# Patient Record
Sex: Male | Born: 1980 | Race: Black or African American | Hispanic: No | Marital: Married | Smoking: Former smoker
Health system: Southern US, Community
[De-identification: ages and names within clinical notes are randomized; demographics above are authoritative.]

## PROBLEM LIST (undated history)

## (undated) DIAGNOSIS — I671 Cerebral aneurysm, nonruptured: Secondary | ICD-10-CM

## (undated) HISTORY — PX: VENTRICULOPERITONEAL SHUNT: SHX204

## (undated) SURGERY — Surgical Case
Anesthesia: *Unknown

---

## 2009-07-23 ENCOUNTER — Emergency Department (HOSPITAL_COMMUNITY): Admission: EM | Admit: 2009-07-23 | Discharge: 2009-07-23 | Payer: Self-pay | Admitting: Emergency Medicine

## 2012-05-05 ENCOUNTER — Emergency Department (HOSPITAL_COMMUNITY)
Admission: EM | Admit: 2012-05-05 | Discharge: 2012-05-05 | Disposition: A | Discharge status: Home / Self Care | Payer: Self-pay | Attending: Emergency Medicine | Admitting: Emergency Medicine

## 2012-05-05 ENCOUNTER — Emergency Department (HOSPITAL_COMMUNITY): Payer: Self-pay

## 2012-05-05 ENCOUNTER — Encounter (HOSPITAL_COMMUNITY): Payer: Self-pay | Admitting: Emergency Medicine

## 2012-05-05 DIAGNOSIS — M62838 Other muscle spasm: Secondary | ICD-10-CM | POA: Insufficient documentation

## 2012-05-05 DIAGNOSIS — F17200 Nicotine dependence, unspecified, uncomplicated: Secondary | ICD-10-CM | POA: Insufficient documentation

## 2012-05-05 DIAGNOSIS — M549 Dorsalgia, unspecified: Secondary | ICD-10-CM | POA: Insufficient documentation

## 2012-05-05 HISTORY — DX: Cerebral aneurysm, nonruptured: I67.1

## 2012-05-05 MED ORDER — OXYCODONE-ACETAMINOPHEN 5-325 MG PO TABS: 1.0000 | ORAL_TABLET | Freq: Four times a day (QID) | ORAL | Status: AC | PRN

## 2012-05-05 MED ORDER — DIAZEPAM 5 MG/ML IJ SOLN: 5.0000 mg | Freq: Once | INTRAMUSCULAR | Status: DC

## 2012-05-05 MED ORDER — OXYCODONE-ACETAMINOPHEN 5-325 MG PO TABS
2.0000 | ORAL_TABLET | Freq: Once | ORAL | Status: AC
  Administered 2012-05-05: 2 via ORAL
  Filled 2012-05-05: qty 2

## 2012-05-05 MED ORDER — DIAZEPAM 5 MG PO TABS: 5.0000 mg | ORAL_TABLET | Freq: Four times a day (QID) | ORAL | Status: AC | PRN

## 2012-05-05 MED ORDER — DIAZEPAM 5 MG PO TABS
5.0000 mg | ORAL_TABLET | Freq: Once | ORAL | Status: AC
  Administered 2012-05-05: 5 mg via ORAL
  Filled 2012-05-05: qty 1

## 2012-05-05 MED ORDER — HYDROMORPHONE HCL PF 1 MG/ML IJ SOLN: 1.0000 mg | Freq: Once | INTRAMUSCULAR | Status: DC

## 2012-05-05 MED ORDER — IBUPROFEN 800 MG PO TABS: 800.0000 mg | ORAL_TABLET | Freq: Three times a day (TID) | ORAL | Status: AC | PRN

## 2012-05-05 NOTE — ED Notes (Signed)
Attempted to call report to4N. RN unable to come to the phone to receive report. Will call ER when she is able to receive report.

## 2012-05-05 NOTE — ED Notes (Signed)
Pt given ice pack for back pain.  

## 2012-05-05 NOTE — ED Provider Notes (Signed)
History     CSN: [id number]  Arrival date & time 05/05/12  0013   First MD Initiated Contact with Patient 05/05/12 [phone]      Chief Complaint  Patient presents with  . Back Pain   HPI  History provided by the patient. 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 Patient states that he awoke with low back pains yesterday morning. Pain was persistent and increased through the day. Pain was worse with any kind of movements or walking. Pain was better lying still or resting. Patient attempted use over-the-counter back pain medication as well as topical muscle rubs and topical heat patches. Patient states this is not help with pain significantly. Patient denies having similar symptoms previously. 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 He denies any specific injury at work. Denies any trauma or other strenuous activity. Pain does not radiate. Patient denies any dysuria, hematuria, urinary frequency or flank pains. He denies any urinary or fecal incontinence, urinary retention or perineal numbness. No numbness weakness in lower legs. Patient denies any headache.    Past Medical History  Diagnosis Date  . Brain aneurysm     Past Surgical History  Procedure Date  . Ventriculoperitoneal shunt     No family history on file.  History  Substance Use Topics  . Smoking status: Current Some Day Smoker  . Smokeless tobacco: Not on file  . Alcohol Use: Yes     [email or web address]      Review of Systems  Constitutional: Negative for fever and chills.  Genitourinary: Negative for dysuria, frequency, hematuria and flank pain.  Musculoskeletal: Positive for back pain.  Neurological: Negative for dizziness, weakness, light-headedness, numbness and headaches.    Allergies  Review of patient's allergies indicates no known  allergies.  Home Medications  No current outpatient prescriptions on file.  BP 139/67  Pulse 58  Temp 97.5 F (36.4 C) (Oral)  Resp 16  Ht 5\' 8"  (1.727 m)  Wt 185 lb (83.915 kg)  BMI 28.13 kg/m2  SpO2 100%  Physical Exam  Nursing note and vitals reviewed. Constitutional: He is oriented to person, place, and time. He appears well-developed and well-nourished. No distress.  HENT:  Head: Normocephalic.  Cardiovascular: Normal rate and regular rhythm.   Pulmonary/Chest: Effort normal and breath sounds normal. No respiratory distress. He has no wheezes.  Abdominal: Soft. There is no tenderness.       No CVA tenderness  Musculoskeletal:       Cervical back: Normal.       Thoracic back: Normal.       Lumbar back: He exhibits tenderness. He exhibits no bony tenderness.       Back:  Neurological: He is alert and oriented to person, place, and time. He has normal strength. No sensory deficit.  Skin: Skin is warm. No rash noted.  Psychiatric: He has a normal mood and affect. His behavior is normal.    ED Course  Procedures  Dg Lumbar Spine Complete  05/05/2012  *RADIOLOGY REPORT*  Clinical Data: Negative for injury.  Low back pain for 48 hours.  LUMBAR SPINE - COMPLETE 4+ VIEW  Comparison: None.  Findings: Ventriculoperitoneal shunt catheter is present terminating in the right anatomic pelvis.  Spinal alignment is anatomic.  Vertebral body height is preserved.  There is no spondylolisthesis.  Intervertebral disc spaces appear within normal limits.  Lumbosacral junction is normal.  IMPRESSION: No acute osseous abnormality.  Original Report Authenticated By: Andreas Newport, M.D.     1. Back pain   2. Muscle spasm       MDM  Patient seen and evaluated. Patient is not have any spinous tenderness. Patient does have reproducible pain along left paraspinous muscles.   Patient feeling much better after medications. Patient able to stand and walk. No concerning findings on x-ray.  This time suspect muscle spasming.     Angus Seller, [place] 05/06/12 [phone]

## 2012-05-05 NOTE — ED Notes (Signed)
Patient transported to X-ray 

## 2012-05-05 NOTE — ED Notes (Signed)
Pt c/o low back pain x 2 days, denies trauma. Denies urinary s/s

## 2012-05-05 NOTE — ED Notes (Signed)
Pt reports lower back pain x 2 days. States that he woke up with severe back pain and has not been able to move much because of the pain. Pt states that he put [facility] on back with no relief. Pt states he has not iced his back nor taken any otc meds for the pain. Pt rates pain 10/10.

## 2012-05-07 NOTE — ED Provider Notes (Signed)
Medical screening examination/treatment/procedure(s) were performed by non-physician practitioner and as supervising physician I was immediately available for consultation/collaboration.   Malik Paar M Ambriana Selway, MD 05/07/12 0647 

## 2021-03-08 ENCOUNTER — Inpatient Hospital Stay (HOSPITAL_COMMUNITY): Payer: Self-pay

## 2021-03-08 ENCOUNTER — Emergency Department (HOSPITAL_COMMUNITY): Payer: Self-pay

## 2021-03-08 ENCOUNTER — Inpatient Hospital Stay (HOSPITAL_COMMUNITY)
Admission: EM | Admit: 2021-03-08 | Discharge: 2021-03-19 | DRG: 025 | Disposition: A | Discharge status: Rehabilitation Facility | Payer: Self-pay | Attending: Neurological Surgery | Admitting: Neurological Surgery

## 2021-03-08 ENCOUNTER — Other Ambulatory Visit: Payer: Self-pay

## 2021-03-08 DIAGNOSIS — U071 COVID-19: Secondary | ICD-10-CM | POA: Diagnosis present

## 2021-03-08 DIAGNOSIS — I161 Hypertensive emergency: Secondary | ICD-10-CM | POA: Diagnosis present

## 2021-03-08 DIAGNOSIS — Z452 Encounter for adjustment and management of vascular access device: Secondary | ICD-10-CM

## 2021-03-08 DIAGNOSIS — E870 Hyperosmolality and hypernatremia: Secondary | ICD-10-CM | POA: Diagnosis not present

## 2021-03-08 DIAGNOSIS — R739 Hyperglycemia, unspecified: Secondary | ICD-10-CM | POA: Diagnosis present

## 2021-03-08 DIAGNOSIS — Z4659 Encounter for fitting and adjustment of other gastrointestinal appliance and device: Secondary | ICD-10-CM

## 2021-03-08 DIAGNOSIS — I613 Nontraumatic intracerebral hemorrhage in brain stem: Secondary | ICD-10-CM | POA: Diagnosis present

## 2021-03-08 DIAGNOSIS — H5500 Unspecified nystagmus: Secondary | ICD-10-CM | POA: Diagnosis present

## 2021-03-08 DIAGNOSIS — E861 Hypovolemia: Secondary | ICD-10-CM | POA: Diagnosis not present

## 2021-03-08 DIAGNOSIS — I82621 Acute embolism and thrombosis of deep veins of right upper extremity: Secondary | ICD-10-CM | POA: Diagnosis present

## 2021-03-08 DIAGNOSIS — G8191 Hemiplegia, unspecified affecting right dominant side: Secondary | ICD-10-CM | POA: Diagnosis present

## 2021-03-08 DIAGNOSIS — J9621 Acute and chronic respiratory failure with hypoxia: Secondary | ICD-10-CM

## 2021-03-08 DIAGNOSIS — I615 Nontraumatic intracerebral hemorrhage, intraventricular: Secondary | ICD-10-CM

## 2021-03-08 DIAGNOSIS — J690 Pneumonitis due to inhalation of food and vomit: Secondary | ICD-10-CM | POA: Diagnosis not present

## 2021-03-08 DIAGNOSIS — G9349 Other encephalopathy: Secondary | ICD-10-CM | POA: Diagnosis present

## 2021-03-08 DIAGNOSIS — R414 Neurologic neglect syndrome: Secondary | ICD-10-CM | POA: Diagnosis present

## 2021-03-08 DIAGNOSIS — I959 Hypotension, unspecified: Secondary | ICD-10-CM | POA: Diagnosis not present

## 2021-03-08 DIAGNOSIS — F17200 Nicotine dependence, unspecified, uncomplicated: Secondary | ICD-10-CM | POA: Diagnosis present

## 2021-03-08 DIAGNOSIS — J9601 Acute respiratory failure with hypoxia: Secondary | ICD-10-CM | POA: Diagnosis not present

## 2021-03-08 DIAGNOSIS — E876 Hypokalemia: Secondary | ICD-10-CM | POA: Diagnosis not present

## 2021-03-08 DIAGNOSIS — R1310 Dysphagia, unspecified: Secondary | ICD-10-CM | POA: Diagnosis not present

## 2021-03-08 DIAGNOSIS — R509 Fever, unspecified: Secondary | ICD-10-CM

## 2021-03-08 DIAGNOSIS — I82A11 Acute embolism and thrombosis of right axillary vein: Secondary | ICD-10-CM | POA: Diagnosis present

## 2021-03-08 DIAGNOSIS — R0902 Hypoxemia: Secondary | ICD-10-CM

## 2021-03-08 DIAGNOSIS — D6489 Other specified anemias: Secondary | ICD-10-CM | POA: Diagnosis present

## 2021-03-08 DIAGNOSIS — Q282 Arteriovenous malformation of cerebral vessels: Principal | ICD-10-CM

## 2021-03-08 DIAGNOSIS — I82611 Acute embolism and thrombosis of superficial veins of right upper extremity: Secondary | ICD-10-CM | POA: Diagnosis present

## 2021-03-08 DIAGNOSIS — G919 Hydrocephalus, unspecified: Secondary | ICD-10-CM | POA: Diagnosis present

## 2021-03-08 LAB — PROTIME-INR
INR: 1.1 (ref 0.8–1.2)
Prothrombin Time: 14 seconds (ref 11.4–15.2)

## 2021-03-08 LAB — COMPREHENSIVE METABOLIC PANEL
ALT: 26 U/L (ref 0–44)
AST: 27 U/L (ref 15–41)
Albumin: 3.9 g/dL (ref 3.5–5.0)
Alkaline Phosphatase: 44 U/L (ref 38–126)
Anion gap: 7 (ref 5–15)
BUN: 20 mg/dL (ref 6–20)
CO2: 25 mmol/L (ref 22–32)
Calcium: 8.8 mg/dL — ABNORMAL LOW (ref 8.9–10.3)
Chloride: 106 mmol/L (ref 98–111)
Creatinine, Ser: 1.03 mg/dL (ref 0.61–1.24)
GFR, Estimated: >60 mL/min (ref >60)
Glucose, Bld: 138 mg/dL — ABNORMAL HIGH (ref 70–99)
Potassium: 3.4 mmol/L — ABNORMAL LOW (ref 3.5–5.1)
Sodium: 138 mmol/L (ref 135–145)
Total Bilirubin: 0.9 mg/dL (ref 0.3–1.2)
Total Protein: 6.9 g/dL (ref 6.5–8.1)

## 2021-03-08 LAB — I-STAT ARTERIAL BLOOD GAS, ED
Acid-base deficit: 1 mmol/L (ref 0.0–2.0)
Bicarbonate: 22.5 mmol/L (ref 20.0–28.0)
Calcium, Ion: 1.14 mmol/L — ABNORMAL LOW (ref 1.15–1.40)
HCT: 40 % (ref 39.0–52.0)
Hemoglobin: 13.6 g/dL (ref 13.0–17.0)
O2 Saturation: 100 %
Patient temperature: 97.1
Potassium: 4 mmol/L (ref 3.5–5.1)
Sodium: 141 mmol/L (ref 135–145)
TCO2: 23 mmol/L (ref 22–32)
pCO2 arterial: 31.5 mmHg — ABNORMAL LOW (ref 32.0–48.0)
pH, Arterial: 7.459 — ABNORMAL HIGH (ref 7.350–7.450)
pO2, Arterial: 223 mmHg — ABNORMAL HIGH (ref 83.0–108.0)

## 2021-03-08 LAB — I-STAT CHEM 8, ED
BUN: 22 mg/dL — ABNORMAL HIGH (ref 6–20)
Calcium, Ion: 1.07 mmol/L — ABNORMAL LOW (ref 1.15–1.40)
Chloride: 104 mmol/L (ref 98–111)
Creatinine, Ser: 1 mg/dL (ref 0.61–1.24)
Glucose, Bld: 135 mg/dL — ABNORMAL HIGH (ref 70–99)
HCT: 44 % (ref 39.0–52.0)
Hemoglobin: 15 g/dL (ref 13.0–17.0)
Potassium: 3.3 mmol/L — ABNORMAL LOW (ref 3.5–5.1)
Sodium: 140 mmol/L (ref 135–145)
TCO2: 25 mmol/L (ref 22–32)

## 2021-03-08 LAB — SODIUM: Sodium: 141 mmol/L (ref 135–145)

## 2021-03-08 LAB — CBC
HCT: 42.2 % (ref 39.0–52.0)
Hemoglobin: 14.6 g/dL (ref 13.0–17.0)
MCH: 30.6 pg (ref 26.0–34.0)
MCHC: 34.6 g/dL (ref 30.0–36.0)
MCV: 88.5 fL (ref 80.0–100.0)
Platelets: 276 10*3/uL (ref 150–400)
RBC: 4.77 MIL/uL (ref 4.22–5.81)
RDW: 11.9 % (ref 11.5–15.5)
WBC: 5.5 10*3/uL (ref 4.0–10.5)
nRBC: 0 % (ref 0.0–0.2)

## 2021-03-08 LAB — DIFFERENTIAL
Abs Immature Granulocytes: 0.01 10*3/uL (ref 0.00–0.07)
Basophils Absolute: 0 10*3/uL (ref 0.0–0.1)
Basophils Relative: 1 %
Eosinophils Absolute: 0.2 10*3/uL (ref 0.0–0.5)
Eosinophils Relative: 3 %
Immature Granulocytes: 0 %
Lymphocytes Relative: 43 %
Lymphs Abs: 2.4 10*3/uL (ref 0.7–4.0)
Monocytes Absolute: 0.5 10*3/uL (ref 0.1–1.0)
Monocytes Relative: 9 %
Neutro Abs: 2.5 10*3/uL (ref 1.7–7.7)
Neutrophils Relative %: 44 %

## 2021-03-08 LAB — APTT: aPTT: 30 seconds (ref 24–36)

## 2021-03-08 LAB — CBG MONITORING, ED: Glucose-Capillary: 121 mg/dL — ABNORMAL HIGH (ref 70–99)

## 2021-03-08 LAB — RESP PANEL BY RT-PCR (FLU A&B, COVID) ARPGX2
Influenza A by PCR: NEGATIVE
Influenza B by PCR: NEGATIVE
SARS Coronavirus 2 by RT PCR: POSITIVE — AB

## 2021-03-08 LAB — MRSA PCR SCREENING: MRSA by PCR: NEGATIVE

## 2021-03-08 MED ORDER — PROPOFOL 10 MG/ML IV BOLUS: 40.0000 mg | Freq: Once | INTRAVENOUS | Status: DC

## 2021-03-08 MED ORDER — DOCUSATE SODIUM 50 MG/5ML PO LIQD
100.0000 mg | Freq: Two times a day (BID) | ORAL | Status: DC
  Administered 2021-03-09 – 2021-03-12 (×8): 100 mg
  Filled 2021-03-08 (×8): qty 10

## 2021-03-08 MED ORDER — ORAL CARE MOUTH RINSE
15.0000 mL | OROMUCOSAL | Status: DC
  Administered 2021-03-09 – 2021-03-13 (×46): 15 mL via OROMUCOSAL

## 2021-03-08 MED ORDER — LEVETIRACETAM IN NACL 1000 MG/100ML IV SOLN
1000.0000 mg | Freq: Once | INTRAVENOUS | Status: AC
  Administered 2021-03-08: 1000 mg via INTRAVENOUS

## 2021-03-08 MED ORDER — CLEVIDIPINE BUTYRATE 0.5 MG/ML IV EMUL
0.0000 mg/h | INTRAVENOUS | Status: DC
Start: 2021-03-08 — End: 2021-03-15
  Administered 2021-03-13: 2 mg/h via INTRAVENOUS
  Administered 2021-03-13: 8 mg/h via INTRAVENOUS
  Administered 2021-03-14 (×2): 4 mg/h via INTRAVENOUS
  Filled 2021-03-08: qty 100
  Filled 2021-03-08 (×2): qty 50

## 2021-03-08 MED ORDER — MIDAZOLAM HCL 2 MG/2ML IJ SOLN
5.0000 mg | INTRAMUSCULAR | Status: DC | PRN
  Administered 2021-03-08 – 2021-03-14 (×2): 5 mg via INTRAVENOUS
  Filled 2021-03-08 (×2): qty 6

## 2021-03-08 MED ORDER — CHLORHEXIDINE GLUCONATE 0.12% ORAL RINSE (MEDLINE KIT)
15.0000 mL | Freq: Two times a day (BID) | OROMUCOSAL | Status: DC
  Administered 2021-03-08 – 2021-03-13 (×10): 15 mL via OROMUCOSAL

## 2021-03-08 MED ORDER — SODIUM CHLORIDE 3 % IV BOLUS
250.0000 mL | Freq: Once | INTRAVENOUS | Status: AC
  Administered 2021-03-08: 250 mL via INTRAVENOUS
  Filled 2021-03-08: qty 500

## 2021-03-08 MED ORDER — PROPOFOL 1000 MG/100ML IV EMUL
0.0000 ug/kg/min | INTRAVENOUS | Status: DC
  Administered 2021-03-08: 50 ug/kg/min via INTRAVENOUS
  Administered 2021-03-08: 40 ug/kg/min via INTRAVENOUS
  Administered 2021-03-08: 10 ug/kg/min via INTRAVENOUS
  Administered 2021-03-09: 20 ug/kg/min via INTRAVENOUS
  Administered 2021-03-09 – 2021-03-10 (×3): 30 ug/kg/min via INTRAVENOUS
  Administered 2021-03-10: 20 ug/kg/min via INTRAVENOUS
  Administered 2021-03-11: 30 ug/kg/min via INTRAVENOUS
  Administered 2021-03-11: 25 ug/kg/min via INTRAVENOUS
  Administered 2021-03-11: 30 ug/kg/min via INTRAVENOUS
  Administered 2021-03-11: 50 ug/kg/min via INTRAVENOUS
  Administered 2021-03-12: 40 ug/kg/min via INTRAVENOUS
  Administered 2021-03-12: 25 ug/kg/min via INTRAVENOUS
  Administered 2021-03-12: 40 ug/kg/min via INTRAVENOUS
  Administered 2021-03-13: 25 ug/kg/min via INTRAVENOUS
  Filled 2021-03-08 (×16): qty 100

## 2021-03-08 MED ORDER — ROCURONIUM BROMIDE 50 MG/5ML IV SOLN
100.0000 mg | Freq: Once | INTRAVENOUS | Status: AC
  Administered 2021-03-08: 80 mg via INTRAVENOUS
  Filled 2021-03-08: qty 10

## 2021-03-08 MED ORDER — FENTANYL BOLUS VIA INFUSION
50.0000 ug | INTRAVENOUS | Status: DC | PRN
  Filled 2021-03-08: qty 100

## 2021-03-08 MED ORDER — FENTANYL CITRATE (PF) 100 MCG/2ML IJ SOLN
50.0000 ug | Freq: Once | INTRAMUSCULAR | Status: AC
  Administered 2021-03-08: 50 ug via INTRAVENOUS

## 2021-03-08 MED ORDER — ONDANSETRON HCL 4 MG/2ML IJ SOLN
4.0000 mg | Freq: Once | INTRAMUSCULAR | Status: AC
  Administered 2021-03-08: 4 mg via INTRAVENOUS

## 2021-03-08 MED ORDER — IOHEXOL 350 MG/ML SOLN
100.0000 mL | Freq: Once | INTRAVENOUS | Status: AC | PRN
  Administered 2021-03-08: 100 mL via INTRAVENOUS

## 2021-03-08 MED ORDER — SODIUM CHLORIDE 3 % IV SOLN
INTRAVENOUS | Status: DC
Start: 2021-03-08 — End: 2021-03-13
  Filled 2021-03-08 (×13): qty 500

## 2021-03-08 MED ORDER — SODIUM CHLORIDE 0.9% FLUSH
3.0000 mL | Freq: Once | INTRAVENOUS | Status: AC
  Administered 2021-03-08: 3 mL via INTRAVENOUS

## 2021-03-08 MED ORDER — ETOMIDATE 2 MG/ML IV SOLN
30.0000 mg | Freq: Once | INTRAVENOUS | Status: AC
  Administered 2021-03-08: 30 mg via INTRAVENOUS

## 2021-03-08 MED ORDER — POLYETHYLENE GLYCOL 3350 17 G PO PACK
17.0000 g | PACK | Freq: Every day | ORAL | Status: DC
  Administered 2021-03-09 – 2021-03-12 (×4): 17 g
  Filled 2021-03-08 (×4): qty 1

## 2021-03-08 MED ORDER — FENTANYL 2500MCG IN NS 250ML (10MCG/ML) PREMIX INFUSION
50.0000 ug/h | INTRAVENOUS | Status: DC
  Administered 2021-03-08: 50 ug/h via INTRAVENOUS
  Administered 2021-03-09: 200 ug/h via INTRAVENOUS
  Administered 2021-03-09: 175 ug/h via INTRAVENOUS
  Administered 2021-03-10 – 2021-03-11 (×3): 150 ug/h via INTRAVENOUS
  Administered 2021-03-12 – 2021-03-13 (×2): 125 ug/h via INTRAVENOUS
  Filled 2021-03-08 (×8): qty 250

## 2021-03-08 NOTE — Procedures (Signed)
PREOP DX: Hydrocephalus  POSTOP DX: Same  PROCEDURE: Right frontal ventriculostomy   SURGEON: Dr. Autumn Patty, MD  ANESTHESIA: IV Sedation (versed and fentanyl) with Local  EBL: Minimal  SPECIMENS: None  COMPLICATIONS: None  CONDITION: Hemodynamically stable  INDICATIONS: Mrs. Casey Branch is a 40 y.o. 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  PROCEDURE IN DETAIL: After consent was obtained from the patient's wife, skin of the right frontal scalp was clipped, prepped and draped in the usual sterile fashion.  Scalp was then infiltrated with local anesthetic with epinephrine.  Skin incision was made sharply, and twist drill burr hole was made.  The dura was then incised, and the ventricular catheter was passed in 1 attempt into the lateral ventricle.  Good CSF flow was obtained.  The catheter was then tunneled subcutaneously and connected to a drainage system and the skin incision closed.  The drain was then secured in place.  FINDINGS: 1. Opening pressure >15cm H2O 2. Clear CSF

## 2021-03-08 NOTE — Progress Notes (Signed)
   03/08/21 2300  Provider Notification  Provider Name/Title Dr. Maurice Small  Date Provider Notified 03/08/21  Time Provider Notified 2300  Notification Type Page  Notification Reason Other (Comment) (Bp is under goal of 100-140. This RN has also noticed urine output decreasing. Attempted to contact CCM, per CCM they are not consulted. Need Neurosurgery to put in CCM consult)  Provider response Other (Comment) (Consult for CCM)  Date of Provider Response 03/08/21  Time of Provider Response 2330   [facility] consulted, CCM MD at bedside. Awaiting any new orders

## 2021-03-08 NOTE — ED Triage Notes (Signed)
Pt from home BIB EMS as code stroke. LKWT 0900 this am. Pt had a syncopal episode around 0915 while using bathroom. Pt was diaphoretic and lethargic at home. EMS reports right sided neglect/weakness, left side gaze, and slurred speech. C-collar in place on arrival. BGL 121 on arrival. Hx of aneurysms and shunt in head.

## 2021-03-08 NOTE — Progress Notes (Signed)
NEURO HOSPITALIST CONSULT NOTE   Requestig physician: Dr. Jacqulyn Bath  Reason for Consult: Acute onset of AMS and right hemiplegia.   History obtained from:  EMS and Chart     HPI:                                                                                                                                          Casey Branch is an 40 y.o. male with history of intracranial aneurysm and VP shunt presenting to the ED from home as a Code Stroke with AMS and right sided weakness. LKN was 9 AM. He had a syncopal episode at home while in the bathroom that was not visually witnessed by wife, but during which she heard a "thump" and when she found him he was on the floor. He was able to get up and verbalize that he did not feel well. He laid down and then tried to get up again but was not able to and was becoming incoherent, so his wife called EMS. On EMS arrival, the patient was altered, with left gaze deviation and right hemiplegia. BP was 130/84 and CBG was 107. He vomited at his home and then again on arrival to the ED. STAT CT head was obtained, revealing subarachnoid blood in the posterior fossa with ventricular extension and early stages of ventricular dilatation; left sided ventricular shunt catheter was also noted.   Past Medical History:  Diagnosis Date   Brain aneurysm     Past Surgical History:  Procedure Laterality Date   VENTRICULOPERITONEAL SHUNT      No family history on file.           Social History:  reports that he has been smoking. He does not have any smokeless tobacco history on file. He reports current alcohol use. He reports that he does not use drugs.  No Known Allergies  MEDICATIONS:                                                                                                                     No current facility-administered medications on file prior to encounter.   No current outpatient medications on file prior to encounter.    ROS:  Unable to obtain due to dense global aphasia and somnolence.    There were no vitals taken for this visit.   General Examination:                                                                                                       Physical Exam  HEENT-  Normocephalic. In C-collar.     Lungs- Respirations unlabored Abdomen: Nondistended Extremities- No edema  Neurological Examination Mental Status: Somnolent with global aphasia. Not following commands. Nonverbal except for occasional murmuring to noxious stimuli.  Cranial Nerves: II: Blinks to threat on the left but not on the right. Pupils 3 mm and equally reactive.   III,IV, VI: Eyes remain to the left of midline conjugately. Cannot gaze to the right but can be overcome with oculocephalic maneuver.  V: Decreased responsiveness to right sided stimulation VII: Does not smile to command.  VIII: Alerts to voice IX,X: Unable to assess XI: Head slightly rotated to the left XII: Does not follow commands Motor/Sensory: LUE and LLE with full-strength movements to tactile and noxious stimuli.  RUE with flaccid tone and no movement to noxious RLE with increased extensor tone, which worsens with noxious stimuli Deep Tendon Reflexes: 2+ bilateral brachioradialis. 1+ left patellar. Unable to elicit right patellar due to increased tone.  Plantars: Mute bilaterally  Cerebellar/Gait: Unable to assess   Lab Results: Basic Metabolic Panel: No results for input(s): NA, K, CL, CO2, GLUCOSE, BUN, CREATININE, CALCIUM, MG, PHOS in the last 168 hours.  CBC: No results for input(s): WBC, NEUTROABS, HGB, HCT, MCV, PLT in the last 168 hours.  Cardiac Enzymes: No results for input(s): CKTOTAL, CKMB, CKMBINDEX, TROPONINI in the last 168 hours.  Lipid Panel: No results for input(s): CHOL, TRIG, HDL, CHOLHDL, VLDL, LDLCALC in  the last 462 hours.  Imaging: No results found.  Assessment: 40 year old male with a history of VP shunt presenting with acute subarachnoid and intraventricular hemorrhage, most likely secondary to left lateral ventricular AVM 1. Exam reveals an obtunded patient with right hemiplegia.  2. Neurosurgery has been consulted STAT 3. CT head: Large volume intraventricular hemorrhage primarily in the left lateral ventricle but also in the right ventricle and third ventricle. There is clot adjacent to the tip of the basilar. 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 Hypertensive hemorrhage also possible. Increased density and mild increase in thickness in the tentorium bilaterally which may be due to layering blood. Mild hydrocephalus. Left frontal ventricular peritoneal shunt catheter. 4. CTA of head: Large intraventricular hemorrhage due to arteriovenous malformation located within the atrium of the left lateral ventricle. AVM supply appears to be due to the left anterior choroidal artery and left posterior cerebral artery. Deep venous drainage along the course of the straight sinus. No cerebral aneurysm.  No intracranial stenosis or large vessel occlusion  Recommendations: 1. Management per Neurosurgery 2. Keppra has been loaded 3. BP management  35 minutes spent in the emergent neurological evaluation and management of this critically ill patient.    Electronically signed: Dr. Caryl Pina 03/08/2021, 12:11 PM

## 2021-03-08 NOTE — Progress Notes (Signed)
Re-examined after intubation, pt was eyes closed to stim, trying to pull his ETT out with his left hand, no movement on the right, not following commands. Given he was no longer opening his eyes, ordered a new CTH, which shows continued increase in ventricular size. Given the change in exam and worsening ventriculomegaly, will place EVD.

## 2021-03-08 NOTE — H&P (Signed)
Neurosurgery H&P  CC: Ruptured AVM  HPI: This is a 40 y.o. man w/ a h/o prior intracranial hemorrhage with VPS that presents with [facility], confusion, then LOC with nausea and vomiting. He improved by the time EMS arrived and was somnolent but more interactive, not at baseline. Per the patient's sister's description, it sounds like he was lost to follow up, they had not heard the term AVM before, but thought he may have had an aneurysm, unclear why it wasn't treated. The patient is unable to answer questions, currently being intubated for airway protection, so no further history is available.   ROS: A 14 point ROS was performed and is negative except as noted in the HPI.   PMHx:  Past Medical History:  Diagnosis Date   Brain aneurysm    FamHx: No family history on file. SocHx:  reports that he has been smoking. He does not have any smokeless tobacco history on file. He reports current alcohol use. He reports that he does not use drugs.  Exam: Vital signs in last 24 hours: Weight:  [76.2 kg] 76.2 kg (06/18 1200) General: Obtunded, lying in hospital bed, appears acutely ill Head: VPS valve visible in left frontal region HEENT: wearing C collar Pulmonary: being preoxygenated for intubation Cardiac: RRR Abdomen: S NT ND Extremities: Warm and well perfused x4 Neuro: Eyes open to voice, PERRL, gaze conjugate, occasional inappropriate verbal output, grips reflexively in the BUE but unable to perform formal strength testing, b/l withdraw briskly  Assessment and Plan: 40 y.o. man w/ likely recurrent AVM hemorrhage. CTH/CTA personally reviewed, which show IVH and left thalamic ICH, CTA w/ left thalamic region AVM with dilated veins and multiple feeders mainly off the PCA, ventricles casted with increased temporal horns.   -admit to [facility] ICU -will see how his exam is post-intubation to decide on EVD placement, VPS is likely non-functional - it was post-hemorrhagic hydro and he never had a revision in  10 years per his sister, likely clotted if it was functioning -SBP<160  [name], MD 03/08/21 12:59 PM [facility]

## 2021-03-08 NOTE — Progress Notes (Signed)
Pt was transported to CT via Ventilator. No complications.

## 2021-03-08 NOTE — ED Provider Notes (Signed)
Emergency Department Provider Note   I have reviewed the triage vital signs and the nursing notes.   HISTORY  Chief Complaint No chief complaint on file.   HPI Casey Branch is a 40 y.o. male with past medical history of VP shunt and intraventricular hemorrhage presents to the emergency department with acute onset mental status change and right side weakness.  He arrives as a code stroke with EMS.  The patient's wife states that he was his normal self this morning but then abruptly changed prompting her to call 911.  EMS notes some confusion in route with GCS as low as 14, left gaze preference, and right-sided weakness.  Patient did fall and this was unwitnessed and so arrives in a c-collar.  No witnessed seizure activity.   Level 5 caveat: AMS  Past Medical History:  Diagnosis Date   Brain aneurysm     There are no problems to display for this patient.   Past Surgical History:  Procedure Laterality Date   VENTRICULOPERITONEAL SHUNT      Allergies Patient has no known allergies.  No family history on file.  Social History Social History   Tobacco Use   Smoking status: Some Days    Pack years: 0.00  Substance Use Topics   Alcohol use: Yes    Comment: occasioanl   Drug use: No    Review of Systems  Level 5 caveat: AMS   ____________________________________________   PHYSICAL EXAM:  VITAL SIGNS: Temp: 95.3 F Pulse: 54 RR: 16 BP: 111/68  Constitutional: Alert with left gaze preference. Following commands on the left but some confusion noted.  Eyes: Conjunctivae are normal. PERRL.  Head: Atraumatic. Nose: No congestion/rhinnorhea. Mouth/Throat: Mucous membranes are moist.  Neck: No stridor. C collar in place.  Cardiovascular: Normal rate, regular rhythm. Good peripheral circulation. Grossly normal heart sounds.   Respiratory: Normal respiratory effort.  No retractions. Lungs CTAB. Gastrointestinal: Soft and nontender. No distention.   Musculoskeletal: No gross deformities of extremities. Neurologic: Left gaze preference with right side weakness and some confusion. GCS 14.  Skin:  Skin is warm, dry and intact. No rash noted.   ____________________________________________   LABS (all labs ordered are listed, but only abnormal results are displayed)  Labs Reviewed  COMPREHENSIVE METABOLIC PANEL - Abnormal; Notable for the following components:      Result Value   Potassium 3.4 (*)    Glucose, Bld 138 (*)    Calcium 8.8 (*)    All other components within normal limits  I-STAT CHEM 8, ED - Abnormal; Notable for the following components:   Potassium 3.3 (*)    BUN 22 (*)    Glucose, Bld 135 (*)    Calcium, Ion 1.07 (*)    All other components within normal limits  CBG MONITORING, ED - Abnormal; Notable for the following components:   Glucose-Capillary 121 (*)    All other components within normal limits  RESP PANEL BY RT-PCR (FLU A&B, COVID) ARPGX2  PROTIME-INR  APTT  CBC  DIFFERENTIAL  SODIUM  SODIUM  SODIUM   ____________________________________________  EKG   EKG Interpretation  Date/Time:  Saturday March 08 2021 12:36:42 EDT Ventricular Rate:  66 PR Interval:  185 QRS Duration: 100 QT Interval:  401 QTC Calculation: 421 R Axis:   78 Text Interpretation: Sinus rhythm ST elev, probable normal early repol pattern Confirmed by Alona Bene [phone]) on 03/09/2021 7:30:10 AM         ____________________________________________  RADIOLOGY  CT  ANGIO HEAD NECK W WO CM  Result Date: 03/08/2021 CLINICAL DATA:  Intracranial hemorrhage. EXAM: CT ANGIOGRAPHY HEAD AND NECK TECHNIQUE: Multidetector CT imaging of the head and neck was performed using the standard protocol during bolus administration of intravenous contrast. Multiplanar CT image reconstructions and MIPs were obtained to evaluate the vascular anatomy. Carotid stenosis measurements (when applicable) are obtained utilizing NASCET criteria,  using the distal internal carotid diameter as the denominator. CONTRAST:  100mL OMNIPAQUE IOHEXOL 350 MG/ML SOLN COMPARISON:  CT head 03/08/2021 FINDINGS: CTA NECK FINDINGS Aortic arch: Normal aortic arch. Proximal great vessels widely patent. Bovine arch. Right carotid system: Normal right carotid without atherosclerotic disease or dissection. Left carotid system: Normal left carotid. Negative for atherosclerotic disease or dissection. Vertebral arteries: Both vertebral arteries widely patent without stenosis. Skeleton: Poor dentition with caries. Negative cervical spine. Bifrontal burr holes for shunt catheter placement. Other neck: Negative Upper chest: Negative Review of the MIP images confirms the above findings CTA HEAD FINDINGS Anterior circulation: Cavernous carotid normal bilaterally. Anterior and middle cerebral arteries widely patent without stenosis or large vessel occlusion. Abnormal cluster of enlarged vessels in the left lateral ventricle with large draining vein compatible with AVM. This appears to be supplied via multiple vessels including the anterior choroidal artery on the left. Primary AVM supply appears to be supplied from the left posterior cerebral artery. The nidus appears to be within the left lateral ventricle and may be associated with the choroid plexus. The nidus measures approximately 35 x 28 mm in diameter. Posterior circulation: Both vertebral arteries patent to the basilar. PICA patent bilaterally. Basilar widely patent. Superior cerebellar and posterior cerebral arteries patent bilaterally. Enlarged left posterior cerebral artery which appears to be supplying the AVM in the left lateral ventricle. Venous sinuses: Normal enhancement of the dural venous sinuses. Enlarged draining veins in the AVM with drainage to the deep venous system including the internal cerebral veins and along the straight sinus. There appear to be enlarged collateral veins along the straight sinus draining  into the torcular. Review of the MIP images confirms the above findings IMPRESSION: 1. 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 AVM supply appears to be due to the left anterior choroidal artery and left posterior cerebral artery. Deep venous drainage along the course of the straight sinus. No cerebral aneurysm 2. No intracranial stenosis or large vessel occlusion. 3. These results were called by telephone at the time of interpretation on 03/08/2021 at 12:51 pm to provider [facility] , who verbally acknowledged these results. Electronically Signed   By: Marlan Palauharles  Clark M.D.   On: 03/08/2021 12:54   CT HEAD CODE STROKE WO CONTRAST  Result Date: 03/08/2021 CLINICAL DATA:  Code stroke.  Acute neuro deficit. EXAM: CT HEAD WITHOUT CONTRAST TECHNIQUE: Contiguous axial images were obtained from the base of the skull through the vertex without intravenous contrast. COMPARISON:  None. FINDINGS: Brain: Intraventricular hemorrhage. Large amount of blood fills the left lateral ventricle. Small amount of blood in the right lateral ventricle and third ventricle. No blood in the fourth ventricle. Increased density of the tentorium bilaterally which may be due to blood along the tentorium. Small amount of subarachnoid hemorrhage in the region of the distal basilar artery. Left frontal ventricular peritoneal shunt tubing. Mild ventricular enlargement which may be due to acute hydrocephalus from intraventricular hemorrhage. No acute infarct or mass. Shunt tract right frontal bone with calcification in the right frontal lobe along the  tract of the removed shunt. Vascular: High-density clot near the tip of the basilar which could be due to aneurysmal rupture. Otherwise no hyperdense vessel Skull: Bifrontal burr holes for shunt placement. Right shunt has been removed. Sinuses/Orbits: Mild mucosal edema paranasal sinuses. Negative orbit Other: None  ASPECTS (Alberta Stroke Program Early CT Score) Aspect score not calculated due to acute intracranial hemorrhage IMPRESSION: 1. Large volume intraventricular hemorrhage primarily in the left lateral ventricle but also in the right ventricle and third ventricle. There is clot adjacent to the tip of the basilar. 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 Hypertensive hemorrhage also possible. 2. Increased density and mild increase in thickness in the tentorium bilaterally which may be due to layering blood. 3. Mild hydrocephalus. Left frontal ventricular peritoneal shunt catheter. 4. These results were called by telephone at the time of interpretation on 03/08/2021 at 12:27 pm to provider Zyad Boomer , who verbally acknowledged these results. Electronically Signed   By: Marlan Palau M.D.   On: 03/08/2021 12:29    ____________________________________________   PROCEDURES  Procedure(s) performed:   Procedure Name: Intubation Date/Time: 03/08/2021 1:09 PM Performed by: Maia Plan, MD Pre-anesthesia Checklist: Patient identified, Patient being monitored, Emergency Drugs available, Timeout performed and Suction available Oxygen Delivery Method: Non-rebreather mask Preoxygenation: Pre-oxygenation with 100% oxygen Induction Type: Rapid sequence Ventilation: Mask ventilation without difficulty Laryngoscope Size: Glidescope and 3 Grade View: Grade I Tube size: 7.5 mm Number of attempts: 1 Airway Equipment and Method: Video-laryngoscopy Placement Confirmation: ETT inserted through vocal cords under direct vision, CO2 detector and Breath sounds checked- equal and bilateral Secured at: 24 cm Tube secured with: ETT holder Dental Injury: Teeth and Oropharynx as per pre-operative assessment     .Critical Care  Date/Time: 03/08/2021 1:09 PM Performed by: Maia Plan, MD Authorized by: Maia Plan, MD   Critical care provider statement:    Critical care time (minutes):  45   Critical care time  was exclusive of:  Separately billable procedures and treating other patients and teaching time   Critical care was necessary to treat or prevent imminent or life-threatening deterioration of the following conditions:  CNS failure or compromise   Critical care was time spent personally by me on the following activities:  Discussions with consultants, evaluation of patient's response to treatment, examination of patient, ordering and performing treatments and interventions, ordering and review of laboratory studies, ordering and review of radiographic studies, pulse oximetry, re-evaluation of patient's condition, obtaining history from patient or surrogate, review of old charts, blood draw for specimens, development of treatment plan with patient or surrogate and ventilator management   I assumed direction of critical care for this patient from another provider in my specialty: no     Care discussed with: admitting provider     ____________________________________________   INITIAL IMPRESSION / ASSESSMENT AND PLAN / ED COURSE  Pertinent labs & imaging results that were available during my care of the patient were reviewed by me and considered in my medical decision making (see chart for details).   12:05 PM  Evaluated the patient at the bridge on arrival.  He has some right side weakness and confusion.  GCS 14.  He had an episode of clear emesis during our evaluation.  I opted to treat with Zofran and move him briefly into treatment room where he could be suctioned.  He is not hypoxic.  Mental status has not acutely changed.  Was able to sit up and turn to the side with  vomiting.  Feel he is adequately protecting his airway for CT. Will go to CT scanner with nursing and neurology at bedside.   Patient with large intraventricular hemorrhage and CT. stat CTA added.  Discussed the case and images with Dr. Johnsie Cancel with neurosurgery who came to the bedside for evaluation.  Patient is slightly more  confused upon return from CT.  Discussed to the CT findings and case with the patient's wife.  Made the decision to intubate the patient for airway protection.  No additional vomiting.   02:20 PM  Patient awaiting ICU bed placement. He is becoming agitated on Propofol and Fentanyl. Have give bolus does of each. Will give PRN Versed as well.  Discussed patient's case with NSG, Dr. Dolphus Jenny to request admission. Patient and family (if present) updated with plan. Care transferred to NSG service.  I reviewed all nursing notes, vitals, pertinent old records, EKGs, labs, imaging (as available).  ____________________________________________  FINAL CLINICAL IMPRESSION(S) / ED DIAGNOSES  Final diagnoses:  Intraventricular hemorrhage (HCC)     MEDICATIONS GIVEN DURING THIS VISIT:  Medications  sodium chloride (hypertonic) 3 % solution ( Intravenous New Bag/Given 03/08/21 1246)  docusate (COLACE) 50 MG/5ML liquid 100 mg (has no administration in time range)  polyethylene glycol (MIRALAX / GLYCOLAX) packet 17 g (has no administration in time range)  fentaNYL in NS (64mcg/ml) infusion-PREMIX (50 mcg/hr Intravenous New Bag/Given 03/08/21 1249)  fentaNYL (SUBLIMAZE) bolus via infusion 50-100 mcg (has no administration in time range)  propofol (DIPRIVAN) 1000 MG/100ML infusion (10 mcg/kg/min  76.2 kg Intravenous New Bag/Given 03/08/21 1300)  sodium chloride flush (NS) 0.9 % injection 3 mL (3 mLs Intravenous Given 03/08/21 1247)  sodium chloride 3% (hypertonic) IV bolus 250 mL (0 mLs Intravenous Stopped 03/08/21 1246)  levETIRAcetam (KEPPRA) IVPB 1000 mg/100 mL premix (0 mg Intravenous Stopped 03/08/21 1241)  iohexol (OMNIPAQUE) 350 MG/ML injection 100 mL (100 mLs Intravenous Contrast Given 03/08/21 1230)  rocuronium (ZEMURON) injection 100 mg (80 mg Intravenous Given 03/08/21 1244)  fentaNYL (SUBLIMAZE) injection 50 mcg (50 mcg Intravenous Bolus 03/08/21 1254)  etomidate (AMIDATE) injection  30 mg (30 mg Intravenous Given 03/08/21 1243)  ondansetron (ZOFRAN) injection 4 mg (4 mg Intravenous Given 03/08/21 1205)      Note:  This document was prepared using Dragon voice recognition software and may include unintentional dictation errors.  Alona Bene, MD, [facility] Emergency Medicine    Kierria Feigenbaum, Arlyss Repress, MD 03/09/21 [phone]

## 2021-03-08 NOTE — Progress Notes (Signed)
Transport to pt room from ED.

## 2021-03-08 NOTE — ED Notes (Signed)
c spine cleared. c-collar removed.

## 2021-03-09 ENCOUNTER — Encounter (INDEPENDENT_AMBULATORY_CARE_PROVIDER_SITE_OTHER): Payer: Self-pay

## 2021-03-09 ENCOUNTER — Inpatient Hospital Stay (HOSPITAL_COMMUNITY): Payer: Self-pay

## 2021-03-09 DIAGNOSIS — J9601 Acute respiratory failure with hypoxia: Secondary | ICD-10-CM

## 2021-03-09 DIAGNOSIS — I615 Nontraumatic intracerebral hemorrhage, intraventricular: Secondary | ICD-10-CM

## 2021-03-09 DIAGNOSIS — J988 Other specified respiratory disorders: Secondary | ICD-10-CM

## 2021-03-09 DIAGNOSIS — U071 COVID-19: Secondary | ICD-10-CM

## 2021-03-09 LAB — SODIUM
Sodium: 142 mmol/L (ref 135–145)
Sodium: 146 mmol/L — ABNORMAL HIGH (ref 135–145)
Sodium: 142 mmol/L (ref 135–145)

## 2021-03-09 LAB — GLUCOSE, CAPILLARY
Glucose-Capillary: 79 mg/dL (ref 70–99)
Glucose-Capillary: 128 mg/dL — ABNORMAL HIGH (ref 70–99)
Glucose-Capillary: 98 mg/dL (ref 70–99)
Glucose-Capillary: 87 mg/dL (ref 70–99)
Glucose-Capillary: 89 mg/dL (ref 70–99)
Glucose-Capillary: 99 mg/dL (ref 70–99)

## 2021-03-09 LAB — MAGNESIUM: Magnesium: 1.9 mg/dL (ref 1.7–2.4)

## 2021-03-09 LAB — POCT I-STAT 7, (LYTES, BLD GAS, ICA,H+H)
Acid-base deficit: 2 mmol/L (ref 0.0–2.0)
Bicarbonate: 22.4 mmol/L (ref 20.0–28.0)
Calcium, Ion: 1.2 mmol/L (ref 1.15–1.40)
HCT: 37 % — ABNORMAL LOW (ref 39.0–52.0)
Hemoglobin: 12.6 g/dL — ABNORMAL LOW (ref 13.0–17.0)
O2 Saturation: 99 %
Patient temperature: 36.6
Potassium: 3.4 mmol/L — ABNORMAL LOW (ref 3.5–5.1)
Sodium: 147 mmol/L — ABNORMAL HIGH (ref 135–145)
TCO2: 24 mmol/L (ref 22–32)
pCO2 arterial: 37 mmHg (ref 32.0–48.0)
pH, Arterial: 7.388 (ref 7.350–7.450)
pO2, Arterial: 157 mmHg — ABNORMAL HIGH (ref 83.0–108.0)

## 2021-03-09 LAB — BASIC METABOLIC PANEL
Anion gap: 5 (ref 5–15)
BUN: 17 mg/dL (ref 6–20)
CO2: 23 mmol/L (ref 22–32)
Calcium: 8.3 mg/dL — ABNORMAL LOW (ref 8.9–10.3)
Chloride: 117 mmol/L — ABNORMAL HIGH (ref 98–111)
Creatinine, Ser: 1.03 mg/dL (ref 0.61–1.24)
GFR, Estimated: >60 mL/min (ref >60)
Glucose, Bld: 94 mg/dL (ref 70–99)
Potassium: 4 mmol/L (ref 3.5–5.1)
Sodium: 145 mmol/L (ref 135–145)

## 2021-03-09 LAB — TRIGLYCERIDES: Triglycerides: 156 mg/dL — ABNORMAL HIGH (ref <150)

## 2021-03-09 LAB — PHOSPHORUS: Phosphorus: 1.6 mg/dL — ABNORMAL LOW (ref 2.5–4.6)

## 2021-03-09 MED ORDER — PROSOURCE TF PO LIQD
45.0000 mL | Freq: Two times a day (BID) | ORAL | Status: DC
  Administered 2021-03-09 – 2021-03-17 (×15): 45 mL
  Filled 2021-03-09 (×14): qty 45

## 2021-03-09 MED ORDER — NOREPINEPHRINE 4 MG/250ML-% IV SOLN
0.0000 ug/min | INTRAVENOUS | Status: DC
  Administered 2021-03-09: 4 ug/min via INTRAVENOUS
  Filled 2021-03-09: qty 250

## 2021-03-09 MED ORDER — CHLORHEXIDINE GLUCONATE CLOTH 2 % EX PADS
6.0000 | MEDICATED_PAD | Freq: Every day | CUTANEOUS | Status: DC
  Administered 2021-03-10 – 2021-03-19 (×14): 6 via TOPICAL

## 2021-03-09 MED ORDER — LACTATED RINGERS IV SOLN: INTRAVENOUS | Status: DC

## 2021-03-09 MED ORDER — POLYVINYL ALCOHOL 1.4 % OP SOLN
1.0000 [drp] | Freq: Two times a day (BID) | OPHTHALMIC | Status: DC
  Administered 2021-03-09 – 2021-03-19 (×20): 1 [drp] via OPHTHALMIC
  Filled 2021-03-09: qty 15

## 2021-03-09 MED ORDER — SODIUM CHLORIDE 0.9 % IV BOLUS: 250.0000 mL | Freq: Once | INTRAVENOUS | Status: DC

## 2021-03-09 MED ORDER — IPRATROPIUM-ALBUTEROL 0.5-2.5 (3) MG/3ML IN SOLN
3.0000 mL | Freq: Four times a day (QID) | RESPIRATORY_TRACT | Status: DC
  Administered 2021-03-09 (×2): 3 mL via RESPIRATORY_TRACT
  Filled 2021-03-09 (×2): qty 3

## 2021-03-09 MED ORDER — SODIUM CHLORIDE 0.9 % IV SOLN: INTRAVENOUS | Status: DC

## 2021-03-09 MED ORDER — PANTOPRAZOLE SODIUM 40 MG IV SOLR
40.0000 mg | INTRAVENOUS | Status: DC
  Administered 2021-03-09 – 2021-03-12 (×4): 40 mg via INTRAVENOUS
  Filled 2021-03-09 (×4): qty 40

## 2021-03-09 MED ORDER — INSULIN ASPART 100 UNIT/ML IJ SOLN
0.0000 [IU] | INTRAMUSCULAR | Status: DC
  Administered 2021-03-09: 1 [IU] via SUBCUTANEOUS
  Administered 2021-03-10: 2 [IU] via SUBCUTANEOUS
  Administered 2021-03-10 – 2021-03-11 (×3): 1 [IU] via SUBCUTANEOUS

## 2021-03-09 MED ORDER — IPRATROPIUM-ALBUTEROL 0.5-2.5 (3) MG/3ML IN SOLN
3.0000 mL | Freq: Two times a day (BID) | RESPIRATORY_TRACT | Status: DC
  Administered 2021-03-09 – 2021-03-10 (×2): 3 mL via RESPIRATORY_TRACT
  Filled 2021-03-09 (×2): qty 3

## 2021-03-09 MED ORDER — POTASSIUM PHOSPHATES 15 MMOLE/5ML IV SOLN
20.0000 mmol | Freq: Once | INTRAVENOUS | Status: AC
  Administered 2021-03-09: 20 mmol via INTRAVENOUS
  Filled 2021-03-09: qty 6.67

## 2021-03-09 MED ORDER — CHLORHEXIDINE GLUCONATE CLOTH 2 % EX PADS: 6.0000 | MEDICATED_PAD | Freq: Every day | CUTANEOUS | Status: DC

## 2021-03-09 MED ORDER — ROCURONIUM BROMIDE 10 MG/ML (PF) SYRINGE
PREFILLED_SYRINGE | INTRAVENOUS | Status: AC
  Administered 2021-03-09: 100 mg
  Filled 2021-03-09: qty 10

## 2021-03-09 MED ORDER — VITAL HIGH PROTEIN PO LIQD
1000.0000 mL | ORAL | Status: DC
  Administered 2021-03-09 – 2021-03-10 (×2): 1000 mL

## 2021-03-09 MED ORDER — ASCORBIC ACID 500 MG PO TABS: 500.0000 mg | ORAL_TABLET | Freq: Every day | ORAL | Status: DC

## 2021-03-09 NOTE — Progress Notes (Signed)
ET tube noted to have ruptured cuff. Elink notified, ground team to bedside. ET tube was exchanged using Glidescope and tube exchanger. ETCo2 positive for color change, bbs noted, SpO2 stable throughout procedure. No complications noted.

## 2021-03-09 NOTE — Procedures (Signed)
Endotracheal Intubation: Patient required placement of an artificial airway secondary to Respiratory Failure. The then current endotracheal tube cuff spontaneously ruptured so patient required re intubation via tube exchanger.  Consent: Emergent.   Hand washing performed prior to starting the procedure.   Medications administered for sedation prior to procedure:  Propofol 80 mg.   A time out procedure was called and correct patient, name, & ID confirmed. Needed supplies and equipment were assembled and checked to include ETT, 10 ml syringe, Glidescope, Mac and Miller blades, suction, oxygen and bag mask valve, end tidal CO2 monitor.   Patient was positioned to align the mouth and pharynx to facilitate visualization of the glottis.   Heart rate, SpO2 and blood pressure was continuously monitored during the procedure. Pre-oxygenation was conducted prior to intubation and endotracheal tube was placed through the vocal cords into the trachea.     The Glidescope was inserted in the oropharynx. The tube exchanger was introduced into the endotracheal tube, the balloon was ruptured but deflation was verified. The old endotracheal tube was removed and a new size 8 ET tube was placed. The Glidescope verified placement.  ETT was secured at 24 cm mark.  Placement was confirmed by auscuitation of lungs with good breath sounds bilaterally and no stomach sounds.  Condensation was noted on endotracheal tube.   Pulse ox 98%.  CO2 detector in place with appropriate color change.   Complications: None .   Operator: Celesta Aver  Chest radiograph demonstrate ET tube in good position.  Comments: OGT placed via glidescope.  Cammie Sickle, M.D

## 2021-03-09 NOTE — Progress Notes (Signed)
Brief PCCM progress note:  EVD for bleed, hydrocephalus. Central line and A line for BP control. On NE the AM, low dose.  Plan: Hypoxemic respiratory failure due to encephalopathy from brain bleed: --PRVC, ABG is ok, PSV tomorrow --Vap bundle --stress ulcer ppx  Encephalopathy due to brain bleed --RASS -2 --Wean prop due to hypotension, maximize fentanyl, midazolam PRN   Brain bleed: --HTS, goal Na 150, 145 this AM repeat this PM and increase rate if not at goal --EVD per NSG  COVID 19: Incidental, CXR clear. --Airborne, no medication indicated

## 2021-03-09 NOTE — Progress Notes (Signed)
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 Witnessed vomiting. CT head with subarachnoid blood in the posterior fossa with ventricular extension an early stages of ventricular dilatation. Intubated in the ED for airway protection. Right frontal ventriculostomy was placed. Started on 3% saline.  eICU Interventions  Maintain current vent settings Seizure precaution     Intervention Category Evaluation Type: New Patient Evaluation  Darl Pikes 03/09/2021, 2:33 AM

## 2021-03-09 NOTE — Consult Note (Addendum)
Reason for Consult:Ventilator management Referring Physician: Dr. Maurice Small    HPI:  Casey Branch is an 40 year old young man with PMH significant for intracranial hemorrhage with ventricular peritoneal shunt. It is reported that the patient presented to the ED from home with a chief complaint of altered mental status and right sided weakness. Hence he was a code stroke upon arrival. The ED physician reports that the patient had an unwitnessed syncopal event at home. His wife heard a thump and found the patient on the floor.  The patient was able to get up and verbalize the he did not feel well.  He later became incoherent so EMS was called.  "On EMS arrival, the patient was altered, with left gaze deviation and right hemiplegia. BP was 130/84 and CBG was 107. He vomited at his home and then again on arrival to the ED. STAT CT head was obtained, revealing subarachnoid blood in the posterior fossa with ventricular extension and early stages of ventricular dilatation; left sided ventricular shunt catheter was also noted." This is as reported by the ED physician's note.  He was intubated in the ED.  The patient was seen and admitted to Dr. Marcy Siren service. A right frontal ventriculostomy was placed and the patient was started on 3% saline and a goal BP of 100-140 mm hg.  CCM team was consulted for ventilator management.  I have seen and examined the patient. This consult was done after reviewing the chart and speaking with the patient's beside nurse since the patient nor family were available to contribute to the consult. Past Medical History:  Diagnosis Date   Brain aneurysm     Past Surgical History:  Procedure Laterality Date   VENTRICULOPERITONEAL SHUNT      No family history on file.  Social History:  reports that he has been smoking. He does not have any smokeless tobacco history on file. He reports current alcohol use. He reports that he does not use drugs.  Allergies: No Known  Allergies  Medications: I have reviewed the patient's current medications.  Results for orders placed or performed during the hospital encounter of 03/08/21 (from the past 48 hour(s))  CBG monitoring, ED     Status: Abnormal   Collection Time: 03/08/21 12:04 PM  Result Value Ref Range   Glucose-Capillary 121 (H) 70 - 99 mg/dL    Comment: Glucose reference range applies only to samples taken after fasting for at least 8 hours.  Protime-INR     Status: None   Collection Time: 03/08/21 12:12 PM  Result Value Ref Range   Prothrombin Time 14.0 11.4 - 15.2 seconds   INR 1.1 0.8 - 1.2    Comment: (NOTE) INR goal varies based on device and disease states. Performed at [facility], [address] [place], [place] [address]   APTT     Status: None   Collection Time: 03/08/21 12:12 PM  Result Value Ref Range   aPTT 30 24 - 36 seconds    Comment: Performed at [facility], [address], [place], [place] [address]  CBC     Status: None   Collection Time: 03/08/21 12:12 PM  Result Value Ref Range   WBC 5.5 4.0 - 10.5 K/uL   RBC 4.77 4.22 - 5.81 MIL/uL   Hemoglobin 14.6 13.0 - 17.0 g/dL   HCT 29.5 62.1 - 30.8 %   MCV 88.5 80.0 - 100.0 fL   MCH 30.6 26.0 - 34.0 pg  MCHC 34.6 30.0 - 36.0 g/dL   RDW 73.5 32.9 - 92.4 %   Platelets 276 150 - 400 K/uL   nRBC 0.0 0.0 - 0.2 %    Comment: Performed at [facility], [address], [place], [place] [address]  Differential     Status: None   Collection Time: 03/08/21 12:12 PM  Result Value Ref Range   Neutrophils Relative % 44 %   Neutro Abs 2.5 1.7 - 7.7 K/uL   Lymphocytes Relative 43 %   Lymphs Abs 2.4 0.7 - 4.0 K/uL   Monocytes Relative 9 %   Monocytes Absolute 0.5 0.1 - 1.0 K/uL   Eosinophils Relative 3 %   Eosinophils Absolute 0.2 0.0 - 0.5 K/uL   Basophils Relative 1 %   Basophils Absolute 0.0 0.0 - 0.1 K/uL   Immature Granulocytes 0 %   Abs Immature Granulocytes 0.01 0.00 - 0.07 K/uL    Comment: Performed at  [facility], [address], [place], [place] [address]  Comprehensive metabolic panel     Status: Abnormal   Collection Time: 03/08/21 12:12 PM  Result Value Ref Range   Sodium 138 135 - 145 mmol/L   Potassium 3.4 (L) 3.5 - 5.1 mmol/L   Chloride 106 98 - 111 mmol/L   CO2 25 22 - 32 mmol/L   Glucose, Bld 138 (H) 70 - 99 mg/dL    Comment: Glucose reference range applies only to samples taken after fasting for at least 8 hours.   BUN 20 6 - 20 mg/dL   Creatinine, Ser 2.97 0.61 - 1.24 mg/dL   Calcium 8.8 (L) 8.9 - 10.3 mg/dL   Total Protein 6.9 6.5 - 8.1 g/dL   Albumin 3.9 3.5 - 5.0 g/dL   AST 27 15 - 41 U/L   ALT 26 0 - 44 U/L   Alkaline Phosphatase 44 38 - 126 U/L   Total Bilirubin 0.9 0.3 - 1.2 mg/dL   GFR, Estimated >98 >92 mL/min    Comment: (NOTE) Calculated using the CKD-EPI Creatinine Equation (2021)    Anion gap 7 5 - 15    Comment: Performed at [facility], [address], [place], [place] [address]  I-stat chem 8, ED     Status: Abnormal   Collection Time: 03/08/21 12:13 PM  Result Value Ref Range   Sodium 140 135 - 145 mmol/L   Potassium 3.3 (L) 3.5 - 5.1 mmol/L   Chloride 104 98 - 111 mmol/L   BUN 22 (H) 6 - 20 mg/dL   Creatinine, Ser 7.40 0.61 - 1.24 mg/dL   Glucose, Bld 814 (H) 70 - 99 mg/dL    Comment: Glucose reference range applies only to samples taken after fasting for at least 8 hours.   Calcium, Ion 1.07 (L) 1.15 - 1.40 mmol/L   TCO2 25 22 - 32 mmol/L   Hemoglobin 15.0 13.0 - 17.0 g/dL   HCT 48.1 85.6 - 31.4 %  Resp Panel by RT-PCR (Flu A&B, Covid) Nasopharyngeal Swab     Status: Abnormal   Collection Time: 03/08/21  1:44 PM   Specimen: Nasopharyngeal Swab; Nasopharyngeal(NP) swabs in vial transport medium  Result Value Ref Range   SARS Coronavirus 2 by RT PCR POSITIVE (A) NEGATIVE    Comment: RESULT CALLED TO, READ BACK BY AND VERIFIED WITH: S,HOCUTT @1640  03/08/21 EB (NOTE) SARS-CoV-2 target nucleic acids are DETECTED.  The SARS-CoV-2  RNA is generally detectable in upper respiratory specimens during  the acute phase of infection. Positive results are indicative of the presence of the identified virus, but do not rule out bacterial infection or co-infection with other pathogens not detected by the test. Clinical correlation with patient history and other diagnostic information is necessary to determine patient infection status. The expected result is Negative.  Fact Sheet for Patients: BloggerCourse.com  Fact Sheet for Healthcare Providers: SeriousBroker.it  This test is not yet approved or cleared by the [place] FDA and  has been authorized for detection and/or diagnosis of SARS-CoV-2 by FDA under an Emergency Use Authorization (EUA).  This EUA will remain in effect (meaning this test can be used) f or the duration of  the COVID-19 declaration under Section 564(b)(1) of the Act, 21 U.S.C. section 360bbb-3(b)(1), unless the authorization is terminated or revoked sooner.     Influenza A by PCR NEGATIVE NEGATIVE   Influenza B by PCR NEGATIVE NEGATIVE    Comment: (NOTE) The Xpert Xpress SARS-CoV-2/FLU/RSV plus assay is intended as an aid in the diagnosis of influenza from Nasopharyngeal swab specimens and should not be used as a sole basis for treatment. Nasal washings and aspirates are unacceptable for Xpert Xpress SARS-CoV-2/FLU/RSV testing.  Fact Sheet for Patients: BloggerCourse.com  Fact Sheet for Healthcare Providers: SeriousBroker.it  This test is not yet approved or cleared by the [place] FDA and has been authorized for detection and/or diagnosis of SARS-CoV-2 by FDA under an Emergency Use Authorization (EUA). This EUA will remain in effect (meaning this test can be used) for the duration of the COVID-19 declaration under Section 564(b)(1) of the Act, 21 U.S.C. section 360bbb-3(b)(1), unless  the authorization is terminated or revoked.  Performed at [facility], [address], [place], [place] [address]   I-Stat arterial blood gas, ED     Status: Abnormal   Collection Time: 03/08/21  2:19 PM  Result Value Ref Range   pH, Arterial 7.459 (H) 7.350 - 7.450   pCO2 arterial 31.5 (L) 32.0 - 48.0 mmHg   pO2, Arterial 223 (H) 83.0 - 108.0 mmHg   Bicarbonate 22.5 20.0 - 28.0 mmol/L   TCO2 23 22 - 32 mmol/L   O2 Saturation 100.0 %   Acid-base deficit 1.0 0.0 - 2.0 mmol/L   Sodium 141 135 - 145 mmol/L   Potassium 4.0 3.5 - 5.1 mmol/L   Calcium, Ion 1.14 (L) 1.15 - 1.40 mmol/L   HCT 40.0 39.0 - 52.0 %   Hemoglobin 13.6 13.0 - 17.0 g/dL   Patient temperature 60.4 F    Collection site Radial    Drawn by RT    Sample type ARTERIAL   MRSA PCR Screening     Status: None   Collection Time: 03/08/21  3:58 PM  Result Value Ref Range   MRSA by PCR NEGATIVE NEGATIVE    Comment:        The GeneXpert MRSA Assay (FDA approved for NASAL specimens only), is one component of a comprehensive MRSA colonization surveillance program. It is not intended to diagnose MRSA infection nor to guide or monitor treatment for MRSA infections. Performed at [facility], [address], [place], [place] [address]   Sodium     Status: None   Collection Time: 03/08/21  6:26 PM  Result Value Ref Range   Sodium 141 135 - 145 mmol/L    Comment: Performed at [facility], [address], [place], [place] [address]    CT [facility] HEAD NECK W  WO CM  Result Date: 03/08/2021 CLINICAL DATA:  Intracranial hemorrhage. EXAM: CT ANGIOGRAPHY HEAD AND NECK TECHNIQUE: Multidetector CT imaging of the head and neck was performed using the standard protocol during bolus administration of intravenous contrast. Multiplanar CT image reconstructions and MIPs were obtained to evaluate the vascular anatomy. Carotid stenosis measurements (when applicable) are obtained utilizing NASCET criteria, using the  distal internal carotid diameter as the denominator. CONTRAST:  OMNIPAQUE IOHEXOL 350 MG/ML SOLN COMPARISON:  CT head 03/08/2021 FINDINGS: CTA NECK FINDINGS Aortic arch: Normal aortic arch. Proximal great vessels widely patent. Bovine arch. Right carotid system: Normal right carotid without atherosclerotic disease or dissection. Left carotid system: Normal left carotid. Negative for atherosclerotic disease or dissection. Vertebral arteries: Both vertebral arteries widely patent without stenosis. Skeleton: Poor dentition with caries. Negative cervical spine. Bifrontal burr holes for shunt catheter placement. Other neck: Negative Upper chest: Negative Review of the MIP images confirms the above findings CTA HEAD FINDINGS Anterior circulation: Cavernous carotid normal bilaterally. Anterior and middle cerebral arteries widely patent without stenosis or large vessel occlusion. Abnormal cluster of enlarged vessels in the left lateral ventricle with large draining vein compatible with AVM. This appears to be supplied via multiple vessels including the anterior choroidal artery on the left. Primary AVM supply appears to be supplied from the left posterior cerebral artery. The nidus appears to be within the left lateral ventricle and may be associated with the choroid plexus. The nidus measures approximately 35 x 28 mm in diameter. Posterior circulation: Both vertebral arteries patent to the basilar. PICA patent bilaterally. Basilar widely patent. Superior cerebellar and posterior cerebral arteries patent bilaterally. Enlarged left posterior cerebral artery which appears to be supplying the AVM in the left lateral ventricle. Venous sinuses: Normal enhancement of the dural venous sinuses. Enlarged draining veins in the AVM with drainage to the deep venous system including the internal cerebral veins and along the straight sinus. There appear to be enlarged collateral veins along the straight sinus draining into the  torcular. Review of the MIP images confirms the above findings IMPRESSION: 1. 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 AVM supply appears to be due to the left anterior choroidal artery and left posterior cerebral artery. Deep venous drainage along the course of the straight sinus. No cerebral aneurysm 2. No intracranial stenosis or large vessel occlusion. 3. These results were called by telephone at the time of interpretation on 03/08/2021 at 12:51 pm to provider ERIC [facility] , who verbally acknowledged these results. Electronically Signed   By: Marlan Palau M.D.   On: 03/08/2021 12:54   CT HEAD WO CONTRAST  Result Date: 03/08/2021 CLINICAL DATA:  Hydrocephalus. Status post placement of ventriculostomy catheter. EXAM: CT HEAD WITHOUT CONTRAST TECHNIQUE: Contiguous axial images were obtained from the base of the skull through the vertex without intravenous contrast. COMPARISON:  CT head without contrast 03/08/2021 at 12:15 p.m. FINDINGS: Brain: Hemorrhage centered in the left thalamus with intraventricular extension again noted. Left frontal ventriculostomy catheter in place. The anterior left ventricle is decompressed. Temporal horn of the left ventricle is slightly larger than on the prior study, measuring 19 mm compared with 17 mm. The anterior horn of the right lateral ventricle is also increasing in size, measuring up to 21 mm on coronal images compared with 16 mm on the prior study. Right temporal horn is enlarging as well. The hemorrhage results in mass effect extending into the upper brainstem. Subarachnoid blood  noted over the tentorium and cerebellar folia. Hemorrhage is present in the third ventricles and aqueduct. Increasing sulcal effacement is noted over the convexities bilaterally. The fourth ventricle is patent. No new hemorrhage is present. Vascular: No hyperdense vessel or unexpected calcification. Skull:  Calvarium intact part from the left frontal ventriculostomy catheter. Sinuses/Orbits: Mild mucosal thickening is present in the ethmoid air cells. No fluid levels are present. The paranasal sinuses and mastoid air cells are otherwise clear. The globes and orbits are within normal limits. Other: Lucencies noted about left maxillary tooth roots. IMPRESSION: 1. Slight increase in size of bilateral lateral ventricles, concerning for progressive hydrocephalus. 2. Left frontal ventriculostomy catheter in place. The anterior left ventricle is decompressed. 3. Increasing sulcal effacement over the convexities bilaterally consistent with increasing intracranial pressure. 4. Stable subarachnoid blood over the tentorium and cerebellar folia. 5. Increasing mass effect extending into the upper brainstem. 6. No new hemorrhage. Critical Value/emergent results were called by telephone at the time of interpretation on 03/08/2021 at 6:37 pm to provider [facility] , who verbally acknowledged these results. Electronically Signed   By: Marin Roberts M.D.   On: 03/08/2021 18:37   DG Chest Portable 1 View  Result Date: 03/08/2021 CLINICAL DATA:  Patient status post intubation. EXAM: PORTABLE CHEST 1 VIEW COMPARISON:  None. FINDINGS: ET tube mid trachea. Enteric tube courses inferior to the diaphragm. VP shunt catheter projects over the chest. Normal cardiac and mediastinal contours. No consolidative pulmonary opacities. No pleural effusion or pneumothorax. IMPRESSION: No acute cardiopulmonary process.  ET tube mid trachea. Electronically Signed   By: Annia Belt M.D.   On: 03/08/2021 13:13   CT HEAD CODE STROKE WO CONTRAST  Result Date: 03/08/2021 CLINICAL DATA:  Code stroke.  Acute neuro deficit. EXAM: CT HEAD WITHOUT CONTRAST TECHNIQUE: Contiguous axial images were obtained from the base of the skull through the vertex without intravenous contrast. COMPARISON:  None. FINDINGS: Brain: Intraventricular hemorrhage.  Large amount of blood fills the left lateral ventricle. Small amount of blood in the right lateral ventricle and third ventricle. No blood in the fourth ventricle. Increased density of the tentorium bilaterally which may be due to blood along the tentorium. Small amount of subarachnoid hemorrhage in the region of the distal basilar artery. Left frontal ventricular peritoneal shunt tubing. Mild ventricular enlargement which may be due to acute hydrocephalus from intraventricular hemorrhage. No acute infarct or mass. Shunt tract right frontal bone with calcification in the right frontal lobe along the tract of the removed shunt. Vascular: High-density clot near the tip of the basilar which could be due to aneurysmal rupture. Otherwise no hyperdense vessel Skull: Bifrontal burr holes for shunt placement. Right shunt has been removed. Sinuses/Orbits: Mild mucosal edema paranasal sinuses. Negative orbit Other: None ASPECTS ([place] Stroke Program Early CT Score) Aspect score not calculated due to acute intracranial hemorrhage IMPRESSION: 1. Large volume intraventricular hemorrhage primarily in the left lateral ventricle but also in the right ventricle and third ventricle. There is clot adjacent to the tip of the basilar. 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 Hypertensive hemorrhage also possible. 2. Increased density and mild increase in thickness in the tentorium bilaterally which may be due to layering blood. 3. Mild hydrocephalus. Left frontal ventricular peritoneal shunt catheter. 4. These results were called by telephone at the time of interpretation on 03/08/2021 at 12:27 pm to provider JOSHUA LONG , who verbally acknowledged these results. Electronically Signed   By: Marlan Palau M.D.   On: 03/08/2021 12:29  Review of Systems  Reason unable to perform ROS: Patient is intubated and family not available.  Blood pressure (!) 98/58, pulse (!) 58, temperature 99.32 F (37.4 C), resp. rate 19, height 5\' 10"   (1.778 m), weight 76.2 kg, SpO2 99 %. Physical Exam Vitals reviewed.  Constitutional:      Appearance: He is ill-appearing.     Comments: Comfortable on the ventilator  HENT:     Head: Normocephalic.     Comments: Right frontal ventriculostomy drain insitu    Nose: Nose normal.     Mouth/Throat:     Comments: ET tube in place Eyes:     Pupils: Pupils are equal, round, and reactive to light.  Cardiovascular:     Rate and Rhythm: Normal rate and regular rhythm.  Pulmonary:     Effort: Pulmonary effort is normal.     Breath sounds: Normal breath sounds. No wheezing or rhonchi.     Comments: Vent: PRVC, RR 16, TV 580, PEEP 5, FIO2 40% Abdominal:     Palpations: Abdomen is soft.  Musculoskeletal:     Cervical back: Neck supple.     Right lower leg: No edema.     Left lower leg: No edema.  Skin:    General: Skin is warm and dry.  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 Plan: management as per primary team-neurosurgeons. Target BP and Na+ management. Target glucose management. Will place central line and arterial line for BP support and management. IVF as per neurosurgeon. Lower dose of Propofol to avoid hypotension.  2. Acute hypoxic respiratory failure secondary to airway compromise from altered mental status. Plan: lung protective vent protocol, check ABG, duo nebs. Add Protonix and SCD, Eye care.  3. COVID 19 positive Plan: add ascorbic acid, airborne isolation per protocol  4. 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  I have dedicated a total of 60 minutes in critical care time minus all appropriate exclusions.   Annett FabianKarol A Minetta Krisher 03/09/2021,  12:26 AM

## 2021-03-09 NOTE — Progress Notes (Signed)
Brief Nutrition Note RD working remotely.   Consult received for enteral/tube feeding initiation and management.  Adult Enteral Nutrition Protocol initiated. Full assessment to follow. Patient is intubated. Does not access documented in flow sheet but imaging report shows abdominal xray reading of OGT (gastric).  Admitting Dx: Intraventricular hemorrhage (HCC) [I61.5]  Body mass index is 24.11 kg/m. Pt meets criteria for normal weight based on current BMI.  Labs:  Recent Labs  Lab 03/08/21 1212 03/08/21 1213 03/08/21 1419 03/08/21 1826 03/09/21 0520 03/09/21 0736 03/09/21 1047  NA 138 140 141   < > 147* 145 146*  K 3.4* 3.3* 4.0  --  3.4* 4.0  --   CL 106 104  --   --   --  117*  --   CO2 25  --   --   --   --  23  --   BUN 20 22*  --   --   --  17  --   CREATININE 1.03 1.00  --   --   --  1.03  --   CALCIUM 8.8*  --   --   --   --  8.3*  --   MG  --   --   --   --   --  1.9  --   PHOS  --   --   --   --   --  1.6*  --   GLUCOSE 138* 135*  --   --   --  94  --    < > = values in this interval not displayed.        Trenton Gammon, MS, RD, LDN, CNSC Inpatient Clinical Dietitian RD pager # available in [facility]  After hours/weekend pager # available in [facility]

## 2021-03-09 NOTE — Procedures (Signed)
Central Venous Catheter Placement:TRIPLE LUMEN Indication: Patient receiving vesicant or irritant drug.; Patient receiving intravenous therapy for longer than 5 days.; Patient has limited or no vascular access.   Consent:Wife [employer]    Hand washing performed prior to starting the procedure.   Procedure: Central venous access-Triple lumen cath  An active timeout was performed and correct patient, name, & ID confirmed.   Patient was positioned correctly for central venous access.  Patient was prepped using strict sterile technique including chlorohexadine preps, sterile drape, sterile gown and sterile gloves.    The area was prepped, draped and anesthetized in the usual sterile manner. Patient comfort was obtained.    A triple lumen catheter was placed in right Internal Jugular Vein There was good blood return, catheter caps were placed on lumens, catheter flushed easily, the line was secured and a sterile dressing and BIO-PATCH applied.   Ultrasound was used to visualize vasculature and guidance of needle.  The patient was hypovolemic with collapsed veins made it difficult to advance the guide wire initially. Hence after a few attempts the guide wire did pass without resistance.  Complications:none Estimated Blood Loss: none Chest Radiograph indicated line in good position withou pneumothorax. Operator: Dorthula Perfect, M.D

## 2021-03-09 NOTE — Progress Notes (Signed)
Neurosurgery Service Progress Note  Subjective: ETT cuff failed o/n, required tube exchange, also required increasing sedation for agitation, then pressors for hypotension  Objective: Vitals:   03/09/21 0700 03/09/21 0747 03/09/21 0755 03/09/21 0800  BP:    109/65  Pulse: 62  (!) 46 (!) 48  Resp: 16  16 16   Temp: (!) 97.52 F (36.4 C)  (!) 97.34 F (36.3 C) (!) 97.16 F (36.2 C)  TempSrc:      SpO2: 100% 100% 100% 100%  Weight:      Height:        Physical Exam: Intubated, sedation held, eyes closed to stim, PERRL, gaze neutral and mildly dysconjugate, +c/c/g, moves L side spontaneously and purposely +localizes on L, w/d RLE, 0/5 RUE  Assessment & Plan: 40 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  -will give him time to recover, consider catheter angiogram and MRI this week to further characterize the AVM if he's doing well -CCM recs -continue EVD at +5  7/18  03/09/21 8:51 AM

## 2021-03-10 ENCOUNTER — Inpatient Hospital Stay (HOSPITAL_COMMUNITY): Payer: Self-pay

## 2021-03-10 DIAGNOSIS — J690 Pneumonitis due to inhalation of food and vomit: Secondary | ICD-10-CM

## 2021-03-10 DIAGNOSIS — Z9911 Dependence on respirator [ventilator] status: Secondary | ICD-10-CM

## 2021-03-10 LAB — GLUCOSE, CAPILLARY
Glucose-Capillary: 108 mg/dL — ABNORMAL HIGH (ref 70–99)
Glucose-Capillary: 111 mg/dL — ABNORMAL HIGH (ref 70–99)
Glucose-Capillary: 122 mg/dL — ABNORMAL HIGH (ref 70–99)
Glucose-Capillary: 122 mg/dL — ABNORMAL HIGH (ref 70–99)
Glucose-Capillary: 156 mg/dL — ABNORMAL HIGH (ref 70–99)
Glucose-Capillary: 88 mg/dL (ref 70–99)
Glucose-Capillary: 91 mg/dL (ref 70–99)

## 2021-03-10 LAB — BASIC METABOLIC PANEL
Anion gap: 8 (ref 5–15)
BUN: 16 mg/dL (ref 6–20)
CO2: 23 mmol/L (ref 22–32)
Calcium: 8.4 mg/dL — ABNORMAL LOW (ref 8.9–10.3)
Chloride: 116 mmol/L — ABNORMAL HIGH (ref 98–111)
Creatinine, Ser: 0.92 mg/dL (ref 0.61–1.24)
GFR, Estimated: >60 mL/min (ref >60)
Glucose, Bld: 99 mg/dL (ref 70–99)
Potassium: 3.5 mmol/L (ref 3.5–5.1)
Sodium: 147 mmol/L — ABNORMAL HIGH (ref 135–145)

## 2021-03-10 LAB — SODIUM
Sodium: 144 mmol/L (ref 135–145)
Sodium: 148 mmol/L — ABNORMAL HIGH (ref 135–145)
Sodium: 149 mmol/L — ABNORMAL HIGH (ref 135–145)

## 2021-03-10 LAB — HEMOGLOBIN A1C
Hgb A1c MFr Bld: 5.4 % (ref 4.8–5.6)
Mean Plasma Glucose: 108 mg/dL

## 2021-03-10 LAB — PHOSPHORUS: Phosphorus: 2.2 mg/dL — ABNORMAL LOW (ref 2.5–4.6)

## 2021-03-10 MED ORDER — SODIUM CHLORIDE 0.9 % IV SOLN
100.0000 mg | Freq: Every day | INTRAVENOUS | Status: AC
  Administered 2021-03-11 – 2021-03-14 (×4): 100 mg via INTRAVENOUS
  Filled 2021-03-10 (×2): qty 20
  Filled 2021-03-10: qty 100
  Filled 2021-03-10 (×2): qty 20

## 2021-03-10 MED ORDER — SODIUM CHLORIDE 0.9 % IV SOLN: INTRAVENOUS | Status: DC

## 2021-03-10 MED ORDER — VITAL 1.5 CAL PO LIQD
1000.0000 mL | ORAL | Status: DC
  Administered 2021-03-10 – 2021-03-14 (×5): 1000 mL

## 2021-03-10 MED ORDER — SODIUM CHLORIDE 0.9 % IV SOLN
200.0000 mg | Freq: Once | INTRAVENOUS | Status: AC
  Administered 2021-03-10: 200 mg via INTRAVENOUS
  Filled 2021-03-10: qty 40

## 2021-03-10 MED ORDER — HEPARIN SODIUM (PORCINE) 5000 UNIT/ML IJ SOLN
5000.0000 [IU] | Freq: Three times a day (TID) | INTRAMUSCULAR | Status: DC
  Administered 2021-03-10 – 2021-03-19 (×28): 5000 [IU] via SUBCUTANEOUS
  Filled 2021-03-10 (×27): qty 1

## 2021-03-10 MED ORDER — DEXAMETHASONE SODIUM PHOSPHATE 10 MG/ML IJ SOLN
6.0000 mg | INTRAMUSCULAR | Status: DC
  Administered 2021-03-10 – 2021-03-13 (×4): 6 mg via INTRAVENOUS
  Filled 2021-03-10 (×5): qty 1

## 2021-03-10 MED ORDER — SODIUM CHLORIDE 0.9 % IV SOLN
3.0000 g | Freq: Four times a day (QID) | INTRAVENOUS | Status: DC
  Administered 2021-03-10 – 2021-03-13 (×12): 3 g via INTRAVENOUS
  Filled 2021-03-10: qty 8
  Filled 2021-03-10 (×2): qty 3
  Filled 2021-03-10 (×2): qty 8
  Filled 2021-03-10 (×9): qty 3

## 2021-03-10 MED ORDER — IPRATROPIUM-ALBUTEROL 0.5-2.5 (3) MG/3ML IN SOLN
3.0000 mL | Freq: Four times a day (QID) | RESPIRATORY_TRACT | Status: DC | PRN
  Administered 2021-03-12: 3 mL via RESPIRATORY_TRACT
  Filled 2021-03-10: qty 3

## 2021-03-10 NOTE — Progress Notes (Signed)
eLink Physician-Brief Progress Note Patient Name: Garvey Westcott DOB: [birth year] MRN: [id number]   Date of Service  03/10/2021  HPI/Events of Note  Request for restraint renewal Patient seen and remains intubated and critically ill  eICU Interventions  Restraint renewed until bedside team can reassess     Intervention Category Minor Interventions: Other:  Darl Pikes 03/10/2021, 10:59 PM

## 2021-03-10 NOTE — Progress Notes (Signed)
Neurosurgery Service Progress Note  Subjective: No acute events overnight, doing better w/ sedation    Objective: Vitals:   03/10/21 0347 03/10/21 0400 03/10/21 0500 03/10/21 0600  BP:  115/60 (!) 108/57 109/60  Pulse:  (!) 54 (!) 54 (!) 55  Resp:  16 16 16   Temp:  99.3 F (37.4 C) 99.1 F (37.3 C) 99.1 F (37.3 C)  TempSrc:      SpO2: 100% 100% 99% 100%  Weight:      Height:        Physical Exam: Intubated, eyes open to voice, PERRL, gaze conjugate, makes eye contact but does not FC, moving L side spontaneously, w/d RLE, 0/5 RUE  Assessment & Plan: 40 y.o. man w/ h/o L thalamic hemorrhage, VPS, then p/w repeat hemorrhage, CTA w/ L thalamic AVM, intubated after arrival for airway protection, developed hydrocephalus s/p R F EVD 6/19.  -will discuss angio timing, likely MRI and angiogram this week -for now, continue EVD at +5 and allow him some more time to recover from hemorrhage/hydrocephalus -CCM recs -will start [facility] today  [facility]  03/10/21 7:21 AM

## 2021-03-10 NOTE — Progress Notes (Signed)
CXR confirms medial RLL infiltrate. Unasyn empirically for aspiration pneumonia.  Steffanie Dunn, DO 03/10/21 5:04 PM [facility]

## 2021-03-10 NOTE — Progress Notes (Signed)
Initial Nutrition Assessment  DOCUMENTATION CODES:   Not applicable  INTERVENTION:   Tube Feeding via OG:  Vital 1.5 at 55 ml/hr Pro-Source TF 45 mL BID Provides 2060 kcals, 111 g of protein and 1003 mL of free water  TF regimen and propofol at current rate providing 2300 total kcal/day     NUTRITION DIAGNOSIS:   Inadequate oral intake related to acute illness as evidenced by NPO status.  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 Pt developed respiratory failure due to encephalopathy from brain bleed requiring intubation. COVID-19+-incidental finding.  CTA with L thalamic AVM  Pt currently sedated on vent Propofol: 9.1 Cleviprex: off  Adult TF protocol initiated yesterday with Vital High Protein infusing at 40 ml/hr via OG tube (enters stomach per abd xray)  Sodium trending up, on hypertonic saline at 50 ml/hr. Sodium goal 150-155  Labs: sodium 147, Creatinine wld, CBGs 88-108  Meds: ss novolog   Diet Order:   Diet Order             Diet NPO time specified  Diet effective now                   EDUCATION NEEDS:   Not appropriate for education at this time  Skin:  Skin Assessment: Reviewed RN Assessment  Last BM:  PTA  Height:   Ht Readings from Last 1 Encounters:  03/08/21 5\' 10"  (1.778 m)    Weight:   Wt Readings from Last 1 Encounters:  03/08/21 76.2 kg    BMI:  Body mass index is 24.11 kg/m.  Estimated Nutritional Needs:   Kcal:  1900-2200 kcals  Protein:  105-130 g  Fluid:  >/= 2 L    03/10/21 MS, RDN, LDN, CNSC Registered Dietitian III Clinical  Nutrition RD Pager and On-Call Pager Number Located in [place]

## 2021-03-10 NOTE — Progress Notes (Signed)
NAME:  Casey Branch MRN:  [id number] DOB:  09-17-1981 LOS: 2 ADMISSION DATE:  03/08/2021 CONSULTATION DATE:  03/09/2021 REFERRING MD:  Maurice Small - NSGY CHIEF COMPLAINT:  ICH, ventilator management.   History of Present Illness:  40 year old man with PMHx significant for prior ICH (?secondary to brain aneurysm) s/p VP shunt placement who presented to [facility] ED with AMS and unilateral R-sided weakness after a syncopal event at home (heard by wife, found patient on floor). Code Stroke was initiated on arrival to ED.  Per chart review, on EMS arrival patient was altered with L gaze deviation and R hemiplegia. BP 130/84 and CBG 107. Vomited x 2, once at home and once on arrival to ED. Initial CT Head demonstrated subarachnoid blood in the posterior fossa with ventricular extension and early ventricular dilation and VP shunt noted (present admission). Patient was subsequently intubated in ED for airway protection, given AMS and neurologic exam. Admitted by NSGY; R frontal EVD placed and HTS 3% initiated. Repeat CT showed L thalamic hemorrhage with intraventricular extension with mass effect and R frontal EVD in place.  PCCM consulted for ventilator management. Incidentally COVID+.  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 Opens eyes, biting ETT, not following commands. Ongoing R-sided neglect.  Interim History / Subjective:  No significant events overnight Increased agitation this AM Very little stimuli seems to cause agitation Requiring increased sedation Biting ETT Copious thick yellow secretions COVID+  Objective:  Blood pressure  120/64, pulse 71, temperature 99.14 F (37.3 C), resp. rate 16, height 5\' 10"  (1.778 m), weight 76.2 kg, SpO2 97 %.    Vent Mode: PRVC FiO2 (%):  [30 %-40 %] 30 % Set Rate:  [16 bmp] 16 bmp Vt Set:  [580 mL] 580 mL PEEP:  [5 cmH20] 5 cmH20 Plateau Pressure:  [15 cmH20-16 cmH20] 15 cmH20   Intake/Output Summary (Last 24 hours) at 03/10/2021 1157 Last data filed at 03/10/2021 1100 Gross per 24 hour  Intake 3277.17 ml  Output 1098 ml  Net 2179.17 ml   Filed Weights   03/08/21 1200  Weight: 76.2 kg   Physical Examination: General: Acutely ill-appearing man in NAD. HEENT: Anicteric sclera, pupils equal (13mm), round and reactive, moist mucous membranes. ETT with copious thick yellow secretions. R frontal EVD in place with serosanguineous output. Neuro: Sedated. Withdraws to pain in left upper and lower extremities. Not following commands. Unilateral R-sided neglect noted. +Corneal, +Cough, and +Gag  CV: RRR, no m/g/r. PULM: Breathing even and unlabored on vent (PEEP 5, FiO2 30%). Lung fields with coarse rhonchi bilaterally. GI: Soft, nontender, nondistended. Normoactive bowel sounds. Extremities: No LE edema noted. Skin: Warm/dry, no rashes.  Labs/imaging that I have personally reviewed: (right click and "Reselect all SmartList Selections" daily)  Na 147, K 3.5 (repleted), CO2 23, BUN 16, Cr 0.92 (1.03) Ca 8.4, Phos 2.2 (1.6)  Glucoses 88-111 last 24H  CT Head (Code Stroke) 6/18 IMPRESSION: 1. Large volume intraventricular hemorrhage primarily in the left lateral ventricle but also in the right ventricle and third ventricle. There is clot adjacent to the tip of the basilar. 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 Hypertensive hemorrhage also possible. 2. Increased density and mild increase in thickness  in the tentorium bilaterally which may be due to layering blood. 3. Mild hydrocephalus. Left frontal ventricular peritoneal shunt catheter.  CTA Head/Neck 6/18 IMPRESSION: 1.  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 AVM supply appears to be due to the left anterior choroidal artery and left posterior cerebral artery. Deep venous drainage along the course of the straight sinus. No cerebral aneurysm 2. No intracranial stenosis or large vessel occlusion.  CT Head 6/18 IMPRESSION: 1. Slight increase in size of bilateral lateral ventricles, concerning for progressive hydrocephalus. 2. Left frontal ventriculostomy catheter in place. The anterior left ventricle is decompressed. 3. Increasing sulcal effacement over the convexities bilaterally consistent with increasing intracranial pressure. 4. Stable subarachnoid blood over the tentorium and cerebellar folia. 5. Increasing mass effect extending into the upper brainstem. 6. No new hemorrhage.  CXR 6/19 IMPRESSION: As before, the ET tube terminates just below the level of the clavicular heads. Additional lines and tubes as described. Trace right basilar atelectasis.  The lungs are otherwise clear.  AXR 6/19  IMPRESSION: Enteric tube with tip projecting in the expected location of the stomach. The side port projects in the expected location of the GE junction.  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 AVM S/p VP shunt placement Presented with AMS/unilateral R-sided weakness. CT Head with L thalamic hemorrhage with intraventricular extension, hydrocephalus and mass effect. NSGY admitted, s/p R frontal EVD placement. - Management per NSGY - Goal SBP <  140, Cleviprex PRN - HTS 3%, goal Na 150-155 - Neuroprotective measures: HOB > 30 degrees, normoglycemia, normothermia, electrolytes WNL - Continue EVD at +5 - OK for SQH for DVT ppx - Plan for MRI/Angiogram this week   Acute encephalopathy 2/2 ICH In the setting of brain bleed. - RASS goal -1 to -2 - Continue Fentanyl, Versed PRN - Minimize Propofol as able due to hypotension  COVID-19 infection, incidental Concern for superimposed bacterial pneumonia Reportedly first tested positive on home test 6/11. - Airborne precautions - Follow intermittent CXR - Begin Remdesivir - Dexamethasone if ok from NSGY standpoint - Empiric Unasyn while awaiting TA/Cx - F/u TA/Resp Cx  Best Practice (right click and "Reselect all SmartList Selections" daily)   Diet/type: tubefeeds Pain/Anxiety/Delirium protocol RASS goal: -1 to -2 VAP protocol (if indicated): Yes DVT prophylaxis: prophylactic heparin , SCDs GI prophylaxis: PPI Glucose control:  SSI Central venous access:  Yes, and it is still needed Arterial line:  N/A Foley:  Yes, and it is still needed Mobility:  bed rest  PT consulted: N/A Studies pending: None Culture data pending: none Last reviewed culture data: today Antibiotics:not indicated  Antibiotic de-escalation: N/A Stop date: N/A Daily labs: requested Code Status:  full code Last date of multidisciplinary goals of care discussion [Per Primary Team] Disposition: remains critically ill, will stay in intensive care  Critical care time: 44 minutes   Tim Lair, PA-C [facility] [facility] 03/10/21 11:57 AM  Please see [email or web address] for pager details.  From 7A-7P if no response, please call [phone] After hours, please  call E-Link [phone]

## 2021-03-10 NOTE — Progress Notes (Signed)
Pharmacy Antibiotic Note  Casey Branch is a 40 y.o. male admitted on 03/08/2021 with pneumonia.  Pharmacy has been consulted for Unasyn dosing.  Pt was admitted for AVM hemorrhage and COVID. Tm 100.5. Suspected for asp PNA in addition to COVID. Start remdesivir and Unasyn.   ALT wnl CrCl 110 ml/min  Plan: Remdesivir 200mg  IV x1 then 100mg  IV q24 x4 Unasyn 3g IV q6   Height: 5\' 10"  (177.8 cm) Weight: 76.2 kg (168 lb) IBW/kg (Calculated) : 73  Temp (24hrs), Avg:99 F (37.2 C), Min:97.52 F (36.4 C), Max:100.58 F (38.1 C)  Recent Labs  Lab 03/08/21 1212 03/08/21 1213 03/09/21 0736 03/10/21 0444  WBC 5.5  --   --   --   CREATININE 1.03 1.00 1.03 0.92    Estimated Creatinine Clearance: 110.2 mL/min (by C-G formula based on SCr of 0.92 mg/dL).    No Known Allergies  Antimicrobials this admission: 6/20 unasyn>> 6/20 remdesivir>>6/24  Dose adjustments this admission:   Microbiology results: 6/20 TA>>  7/20, PharmD, BCIDP, AAHIVP, CPP Infectious Disease Pharmacist 03/10/2021 1:08 PM

## 2021-03-11 ENCOUNTER — Encounter (HOSPITAL_COMMUNITY)
Admission: EM | Disposition: A | Discharge status: Rehabilitation Facility | Payer: Self-pay | Source: Home / Self Care | Attending: Neurological Surgery

## 2021-03-11 ENCOUNTER — Inpatient Hospital Stay (HOSPITAL_COMMUNITY): Payer: Self-pay

## 2021-03-11 ENCOUNTER — Inpatient Hospital Stay (HOSPITAL_COMMUNITY): Payer: Self-pay | Admitting: Certified Registered Nurse Anesthetist

## 2021-03-11 HISTORY — PX: IR 3D INDEPENDENT WKST: IMG2385

## 2021-03-11 HISTORY — PX: IR ANGIO VERTEBRAL SEL VERTEBRAL BILAT MOD SED: IMG5369

## 2021-03-11 HISTORY — PX: RADIOLOGY WITH ANESTHESIA: SHX6223

## 2021-03-11 HISTORY — PX: IR ANGIO INTRA EXTRACRAN SEL COM CAROTID INNOMINATE BILAT MOD SED: IMG5360

## 2021-03-11 LAB — SODIUM
Sodium: 151 mmol/L — ABNORMAL HIGH (ref 135–145)
Sodium: 152 mmol/L — ABNORMAL HIGH (ref 135–145)
Sodium: 154 mmol/L — ABNORMAL HIGH (ref 135–145)

## 2021-03-11 LAB — BASIC METABOLIC PANEL
Anion gap: 12 (ref 5–15)
BUN: 17 mg/dL (ref 6–20)
CO2: 20 mmol/L — ABNORMAL LOW (ref 22–32)
Calcium: 8.5 mg/dL — ABNORMAL LOW (ref 8.9–10.3)
Chloride: 120 mmol/L — ABNORMAL HIGH (ref 98–111)
Creatinine, Ser: 0.9 mg/dL (ref 0.61–1.24)
GFR, Estimated: >60 mL/min (ref >60)
Glucose, Bld: 124 mg/dL — ABNORMAL HIGH (ref 70–99)
Potassium: 3.3 mmol/L — ABNORMAL LOW (ref 3.5–5.1)
Sodium: 152 mmol/L — ABNORMAL HIGH (ref 135–145)

## 2021-03-11 LAB — CBC
HCT: 31.8 % — ABNORMAL LOW (ref 39.0–52.0)
Hemoglobin: 10.5 g/dL — ABNORMAL LOW (ref 13.0–17.0)
MCH: 30.8 pg (ref 26.0–34.0)
MCHC: 33 g/dL (ref 30.0–36.0)
MCV: 93.3 fL (ref 80.0–100.0)
Platelets: 171 10*3/uL (ref 150–400)
RBC: 3.41 MIL/uL — ABNORMAL LOW (ref 4.22–5.81)
RDW: 13.5 % (ref 11.5–15.5)
WBC: 12.2 10*3/uL — ABNORMAL HIGH (ref 4.0–10.5)
nRBC: 0 % (ref 0.0–0.2)

## 2021-03-11 LAB — GLUCOSE, CAPILLARY
Glucose-Capillary: 137 mg/dL — ABNORMAL HIGH (ref 70–99)
Glucose-Capillary: 103 mg/dL — ABNORMAL HIGH (ref 70–99)
Glucose-Capillary: 120 mg/dL — ABNORMAL HIGH (ref 70–99)
Glucose-Capillary: 103 mg/dL — ABNORMAL HIGH (ref 70–99)
Glucose-Capillary: 100 mg/dL — ABNORMAL HIGH (ref 70–99)
Glucose-Capillary: 121 mg/dL — ABNORMAL HIGH (ref 70–99)

## 2021-03-11 LAB — MAGNESIUM: Magnesium: 2.1 mg/dL (ref 1.7–2.4)

## 2021-03-11 SURGERY — IR WITH ANESTHESIA
Anesthesia: General

## 2021-03-11 MED ORDER — ROCURONIUM BROMIDE 10 MG/ML (PF) SYRINGE
PREFILLED_SYRINGE | INTRAVENOUS | Status: AC
  Filled 2021-03-11: qty 10

## 2021-03-11 MED ORDER — LACTATED RINGERS IV SOLN: INTRAVENOUS | Status: DC | PRN

## 2021-03-11 MED ORDER — IOHEXOL 300 MG/ML  SOLN
100.0000 mL | Freq: Once | INTRAMUSCULAR | Status: AC | PRN
  Administered 2021-03-11: 12 mL via INTRA_ARTERIAL

## 2021-03-11 MED ORDER — LIDOCAINE HCL 1 % IJ SOLN
INTRAMUSCULAR | Status: AC
  Filled 2021-03-11: qty 20

## 2021-03-11 MED ORDER — PHENYLEPHRINE HCL-NACL 10-0.9 MG/250ML-% IV SOLN
INTRAVENOUS | Status: DC | PRN
  Administered 2021-03-11: 30 ug/min via INTRAVENOUS

## 2021-03-11 MED ORDER — INSULIN ASPART 100 UNIT/ML IJ SOLN: 0.0000 [IU] | INTRAMUSCULAR | Status: DC

## 2021-03-11 MED ORDER — ROCURONIUM BROMIDE 10 MG/ML (PF) SYRINGE
PREFILLED_SYRINGE | INTRAVENOUS | Status: DC | PRN
  Administered 2021-03-11: 40 mg via INTRAVENOUS

## 2021-03-11 MED ORDER — SENNOSIDES 8.8 MG/5ML PO SYRP
5.0000 mL | ORAL_SOLUTION | Freq: Two times a day (BID) | ORAL | Status: DC
  Administered 2021-03-11 – 2021-03-15 (×5): 5 mL
  Filled 2021-03-11 (×5): qty 5

## 2021-03-11 MED ORDER — IOHEXOL 300 MG/ML  SOLN
100.0000 mL | Freq: Once | INTRAMUSCULAR | Status: AC | PRN
  Administered 2021-03-11: 44 mL via INTRA_ARTERIAL

## 2021-03-11 MED ORDER — DEXAMETHASONE SODIUM PHOSPHATE 10 MG/ML IJ SOLN
INTRAMUSCULAR | Status: AC
  Filled 2021-03-11: qty 1

## 2021-03-11 MED ORDER — FENTANYL CITRATE (PF) 250 MCG/5ML IJ SOLN
INTRAMUSCULAR | Status: AC
  Filled 2021-03-11: qty 5

## 2021-03-11 MED ORDER — ONDANSETRON HCL 4 MG/2ML IJ SOLN
INTRAMUSCULAR | Status: AC
  Filled 2021-03-11: qty 2

## 2021-03-11 MED ORDER — INSULIN ASPART 100 UNIT/ML IJ SOLN
0.0000 [IU] | INTRAMUSCULAR | Status: DC
  Administered 2021-03-11 – 2021-03-12 (×3): 1 [IU] via SUBCUTANEOUS
  Administered 2021-03-12: 2 [IU] via SUBCUTANEOUS
  Administered 2021-03-13 – 2021-03-14 (×4): 1 [IU] via SUBCUTANEOUS

## 2021-03-11 MED ORDER — POTASSIUM CHLORIDE 20 MEQ PO PACK
40.0000 meq | PACK | ORAL | Status: AC
  Administered 2021-03-11 (×2): 40 meq
  Filled 2021-03-11 (×2): qty 2

## 2021-03-11 MED ORDER — PROPOFOL 10 MG/ML IV BOLUS
INTRAVENOUS | Status: AC
  Filled 2021-03-11: qty 20

## 2021-03-11 NOTE — Sedation Documentation (Signed)
Right femoral sheath removed, 5Fr. Exoseal deployed

## 2021-03-11 NOTE — Progress Notes (Signed)
Patient transported on vent to IR without complications. Patientr placed on IR vent. RT vent placed on standby.

## 2021-03-11 NOTE — Transfer of Care (Signed)
Immediate Anesthesia Transfer of Care Note  Patient: Casey Branch  Procedure(s) Performed: IR WITH ANESTHESIA - DIAGNOSTIC CEREBRAL ANGIOGRAM  Patient Location: ICU  Anesthesia Type:General  Level of Consciousness: Patient remains intubated per anesthesia plan  Airway & Oxygen Therapy: Patient placed on Ventilator (see vital sign flow sheet for setting)  Post-op Assessment: Report given to RN and Post -op Vital signs reviewed and stable  Post vital signs: Reviewed  Last Vitals:  Vitals Value Taken Time  BP 112/60 03/11/21 1519  Temp    Pulse 60 03/11/21 1522  Resp 16 03/11/21 1522  SpO2 92 % 03/11/21 1522  Vitals shown include unvalidated device data.  Last Pain:  Vitals:   03/10/21 0000  TempSrc: Bladder         Complications: No notable events documented.

## 2021-03-11 NOTE — Progress Notes (Signed)
Patient transported on vent from IR to 4N22 without complications.

## 2021-03-11 NOTE — Progress Notes (Signed)
NAME:  Casey Branch MRN:  [id number] DOB:  06/18/1981 LOS: 3 ADMISSION DATE:  03/08/2021 CONSULTATION DATE:  03/09/2021 REFERRING MD:  Maurice Small - NSGY CHIEF COMPLAINT:  ICH, ventilator management.   History of Present Illness:  40 year old man with PMHx significant for prior ICH (?secondary to brain aneurysm vs. AVM) s/p VP shunt placement who presented to [facility] ED with AMS and unilateral R-sided weakness after a syncopal event at home (heard by wife, found patient on floor). Code Stroke was initiated on arrival to ED.  Per chart review, on EMS arrival patient was altered with L gaze deviation and R hemiplegia. BP 130/84 and CBG 107. Vomited x 2, once at home and once on arrival to ED. Initial CT Head demonstrated subarachnoid blood in the posterior fossa with ventricular extension and early ventricular dilation and VP shunt noted (present admission). Patient was subsequently intubated in ED for airway protection, given AMS and neurologic exam. Admitted by NSGY; R frontal EVD placed and HTS 3% initiated. Repeat CT showed L thalamic hemorrhage with intraventricular extension with mass effect and R frontal EVD in place.  PCCM consulted for ventilator management. Incidentally COVID+.  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 Opens eyes, biting ETT, not following commands. Ongoing R-sided neglect. Remdesivir + Dexamethasone started for COVID. TA sent, Unasyn for aspiration PNA coverage. 6/21 - Improved agitation overnight, appears more comfortable. WBC/glucoses bumped, likely in the setting of steroids. MRI Angiogram today.  Interim History /  Subjective:  No significant events overnight Improved agitation Decreased sedation, appears comfortable Less secretions, thinning Unasyn/remdesivir/dexamethasone started yesterday Per NSGY, MR Angiogram today  Objective:  Blood pressure (!) 106/57, pulse (!) 57, temperature 98.24 F (36.8 C), resp. rate 16, height 5\' 10"  (1.778 m), weight 81.6 kg, SpO2 98 %.    Vent Mode: PRVC FiO2 (%):  [30 %] 30 % Set Rate:  [16 bmp] 16 bmp Vt Set:  [580 mL] 580 mL PEEP:  [5 cmH20] 5 cmH20 Pressure Support:  [14 cmH20] 14 cmH20 Plateau Pressure:  [17 cmH20-18 cmH20] 17 cmH20   Intake/Output Summary (Last 24 hours) at 03/11/2021 0854 Last data filed at 03/11/2021 0800 Gross per 24 hour  Intake 3829.27 ml  Output 1177 ml  Net 2652.27 ml    Filed Weights   03/08/21 1200 03/11/21 0446  Weight: 76.2 kg 81.6 kg   Physical Examination: General: Acutely ill-appearing man in NAD. HEENT: Anicteric sclera, PERRL 38mm, moist mucous membranes. ETT in place. R frontal EVD in place with serosanguineous output. Neuro: Sedated. Withdraws to pain in L upper, L lower extremities. Not following commands. Unilateral R-sided neglect noted. +Cough and +Gag  CV: RRR, no m/g/r. PULM: Breathing even and unlabored on vent (PEEP 5, FiO2 30%). Good air movement. Lung fields remain coarse, rhonchi improved bilaterally. GI: Soft, nontender, nondistended. Normoactive bowel sounds. Extremities: No LE edema noted. Skin: Warm/dry, no rashes. Brown macular area to R anterior shin.  Labs/imaging that I have personally reviewed: (right click and "Reselect all SmartList Selections" daily)  WBC 12.2 (5.5 6/18), H&H 10.5/31.8, Plt 171  Na 152 (151), K 3.3 (repleted), CO2 20, BUN 17, Cr 0.9 Ca 8.5, Mg 2.1  Glucoses 88-156 last 24H  TA/Cx gram stain: abundant WBCs (predominantly PMNs), gram positive cocci in pairs/clusters and few GNRs  CT Head (Code Stroke) 6/18 IMPRESSION: 1. Large volume intraventricular hemorrhage  primarily in the left lateral ventricle but also in the right ventricle and third ventricle. There is clot adjacent to the tip of the basilar. 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 Hypertensive hemorrhage also possible. 2. Increased density and mild increase in thickness in the tentorium bilaterally which may be due to layering blood. 3. Mild hydrocephalus. Left frontal ventricular peritoneal shunt catheter.  CTA Head/Neck 6/18 IMPRESSION: 1. 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 AVM supply appears to be due to the left anterior choroidal artery and left posterior cerebral artery. Deep venous drainage along the course of the straight sinus. No cerebral aneurysm 2. No intracranial stenosis or large vessel occlusion.  CT Head 6/18 IMPRESSION: 1. Slight increase in size of bilateral lateral ventricles, concerning for progressive hydrocephalus. 2. Left frontal ventriculostomy catheter in place. The anterior left ventricle is decompressed. 3. Increasing sulcal effacement over the convexities bilaterally consistent with increasing intracranial pressure. 4. Stable subarachnoid blood over the tentorium and cerebellar folia. 5. Increasing mass effect extending into the upper brainstem. 6. No new hemorrhage.  CXR 6/19 IMPRESSION: As before, the ET tube terminates just below the level of the clavicular heads. Additional lines and tubes as described. Trace right basilar atelectasis.  The lungs are otherwise clear.  AXR 6/19  IMPRESSION: Enteric tube with tip projecting in the expected location of the stomach. The side port projects in the expected location of the GE junction.  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 AVM S/p VP shunt placement Presented with AMS/unilateral R-sided weakness. CT Head with L thalamic hemorrhage with intraventricular extension, hydrocephalus and mass effect. NSGY admitted, s/p R frontal EVD placement. - Management per NSGY - Goal SBP < 140, Cleviprex PRN - Continue hypertonic saline 3%, goal Na 150-155 - Neuroprotective measures: HOB > 30 degrees, normoglycemia, normothermia, electrolytes WNL - EVD management per NSGY - SQH for DVT ppx - MRI/Angiogram today, 6/21   Acute encephalopathy 2/2 ICH In the setting of brain bleed. - RASS goal -1 to -2 - Continue Propofol, Fentanyl, Versed PRN  COVID-19 infection, incidental Concern for superimposed aspiration pneumonia Reportedly first tested positive on home test 6/11. Tested positive at [facility] 6/16, attempting to import into system. TA/Cx gram stain with abundant WBCs (predominantly PMNs), gram positive cocci in pairs/clusters and few GNRs. - Airborne precautions - Intermittent CXR - Continue remdesivir - Continue dexamethasone - Unasyn for aspiration PNA coverage - F/u finalized TA/Resp Cx  Best Practice (right click and "Reselect all SmartList Selections" daily)   Diet/type: tubefeeds Pain/Anxiety/Delirium protocol RASS goal: -1 to -2 VAP protocol (if indicated): Yes DVT prophylaxis: prophylactic heparin , SCDs GI prophylaxis: PPI Glucose control:  SSI Central venous access:  Yes, and it is still needed Arterial line:  N/A Foley:  Yes, and it is still needed  Mobility:  bed rest  PT consulted: N/A Studies pending: None Culture data pending: none Last reviewed culture data: today Antibiotics:not indicated  Antibiotic  de-escalation: N/A Stop date: N/A Daily labs: requested Code Status:  full code Last date of multidisciplinary goals of care discussion [Per Primary Team] Disposition: remains critically ill, will stay in intensive care  Critical care time: 39 minutes   Tim Lair, PA-C [place] [facility] 03/11/21 8:54 AM  Please see [email or web address] for pager details.  From 7A-7P if no response, please call [phone] After hours, please call E-Link [phone]

## 2021-03-11 NOTE — Progress Notes (Signed)
Neurosurgery Service Progress Note  Subjective: No acute events overnight  Objective: Vitals:   03/11/21 0800 03/11/21 0835 03/11/21 0844 03/11/21 0900  BP: (!) 119/59 (!) 106/57  (!) 106/59  Pulse: 63 64 (!) 57 62  Resp: 16 14 16 16   Temp: 98.24 F (36.8 C)   98.06 F (36.7 C)  TempSrc:      SpO2: 91% 96% 98% 96%  Weight:      Height:        Physical Exam: Intubated, eyes open to voice, PERRL, gaze conjugate, makes eye contact but does not FC, moving L side spontaneously, w/d RLE, 0/5 RUE  Assessment & Plan: 40 y.o. man w/ h/o L thalamic hemorrhage, VPS, then p/w repeat hemorrhage, CTA w/ L thalamic AVM, intubated after arrival for airway protection, developed hydrocephalus s/p R F EVD 6/19.  -angiogram today -continue EVD at +5  -CCM recs  7/19  03/11/21 9:56 AM

## 2021-03-11 NOTE — Brief Op Note (Signed)
  NEUROSURGERY BRIEF OPERATIVE  NOTE   PREOP DX: Left thalamic AVM  POSTOP DX: Same  PROCEDURE: Diagnostic cerebral angiogram  SURGEON: Dr. Lisbeth Renshaw, MD  ANESTHESIA: GETA  EBL: Minimal  SPECIMENS: None  COMPLICATIONS: None  CONDITION: Stable to ICU  FINDINGS (Full report in [facility]): 1. Spetzler-Martin grade 4 left thalamic AVM   Lisbeth Renshaw, MD [facility]

## 2021-03-11 NOTE — Progress Notes (Signed)
  NEUROSURGERY PROGRESS NOTE   No issues overnight.   EXAM:  BP 113/63   Pulse (!) 58   Temp 97.88 F (36.6 C)   Resp 16   Ht 5\' 10"  (1.778 m)   Wt 81.6 kg   SpO2 98%   BMI 25.81 kg/m   Opens eyes to voice Appears to track Pupils reactive Not following commands, moves LUE/LLE purposefully No movement RUE/RLE EVD in place, bloody CSF  IMPRESSION:  40 y.o. male with likely recurrent left thalamic AVM hemorrhage and associated HCP.  PLAN: - Willproceed with diagnostic angiogram today  I have reviewed the situation with the patient's wife 41 over the phone. We discussed the indications for the angiogram, associated risks, benefits, and alternatives. All her questions were answered and she provided consent to proceed.   [place], MD [facility]

## 2021-03-11 NOTE — Progress Notes (Signed)
On June 16th pt had a positive COVID test resulting from [facility].  A copy of this is filed under the "media" tab.  Infection prevention was notified and said the pt could come off precautions after 10 days since he was asymptomatic on admission.

## 2021-03-11 NOTE — Anesthesia Postprocedure Evaluation (Signed)
Anesthesia Post Note  Patient: Casey Branch  Procedure(s) Performed: IR WITH ANESTHESIA - DIAGNOSTIC CEREBRAL ANGIOGRAM     Patient location during evaluation: ICU Anesthesia Type: General Level of consciousness: sedated and patient remains intubated per anesthesia plan Pain management: pain level controlled Vital Signs Assessment: post-procedure vital signs reviewed and stable Respiratory status: patient remains intubated per anesthesia plan and patient on ventilator - see flowsheet for VS Cardiovascular status: stable Postop Assessment: no apparent nausea or vomiting Anesthetic complications: no   No notable events documented.  Last Vitals:  Vitals:   03/11/21 1519 03/11/21 1530  BP: 112/60 109/64  Pulse: 63   Resp: 16   Temp:    SpO2: 92% 93%    Last Pain:  Vitals:   03/10/21 0000  TempSrc: Bladder                 Lannie Fields

## 2021-03-11 NOTE — Anesthesia Preprocedure Evaluation (Addendum)
Anesthesia Evaluation  Patient identified by MRN, date of birth, ID band Patient confused    Reviewed: Allergy & Precautions, NPO status , Patient's Chart, lab work & pertinent test results  Airway Mallampati: Intubated   Neck ROM: Full    Dental  (+) Dental Advisory Given   Pulmonary Current Smoker,  COVID +    Pulmonary exam normal breath sounds clear to auscultation       Cardiovascular negative cardio ROS Normal cardiovascular exam Rhythm:Regular Rate:Normal     Neuro/Psych IVH, presented w/ AMS- VPS nonfunctional Hx brain aneurysm   h/o L thalamic hemorrhage, VPS, then p/w repeat hemorrhage, CTA w/ L thalamic AVM, intubated after arrival for airway protection, developed hydrocephalus s/p R F EVD 6/19. negative psych ROS   GI/Hepatic negative GI ROS, Neg liver ROS,   Endo/Other  negative endocrine ROS  Renal/GU negative Renal ROS  negative genitourinary   Musculoskeletal negative musculoskeletal ROS (+)   Abdominal   Peds  Hematology negative hematology ROS (+)   Anesthesia Other Findings   Reproductive/Obstetrics negative OB ROS                            Anesthesia Physical Anesthesia Plan  ASA: 4  Anesthesia Plan: General   Post-op Pain Management:    Induction: Inhalational  PONV Risk Score and Plan: Treatment may vary due to age or medical condition  Airway Management Planned: Oral ETT  Additional Equipment: None  Intra-op Plan:   Post-operative Plan: Post-operative intubation/ventilation  Informed Consent: I have reviewed the patients History and Physical, chart, labs and discussed the procedure including the risks, benefits and alternatives for the proposed anesthesia with the patient or authorized representative who has indicated his/her understanding and acceptance.     Dental advisory given and Consent reviewed with POA  Plan Discussed with:  CRNA  Anesthesia Plan Comments:       Anesthesia Quick Evaluation

## 2021-03-12 ENCOUNTER — Inpatient Hospital Stay (HOSPITAL_COMMUNITY): Payer: Self-pay

## 2021-03-12 ENCOUNTER — Encounter (HOSPITAL_COMMUNITY): Payer: Self-pay | Admitting: Neurosurgery

## 2021-03-12 DIAGNOSIS — R739 Hyperglycemia, unspecified: Secondary | ICD-10-CM

## 2021-03-12 LAB — BASIC METABOLIC PANEL
Anion gap: 3 — ABNORMAL LOW (ref 5–15)
BUN: 18 mg/dL (ref 6–20)
CO2: 23 mmol/L (ref 22–32)
Calcium: 8.6 mg/dL — ABNORMAL LOW (ref 8.9–10.3)
Chloride: 128 mmol/L — ABNORMAL HIGH (ref 98–111)
Creatinine, Ser: 0.91 mg/dL (ref 0.61–1.24)
GFR, Estimated: >60 mL/min (ref >60)
Glucose, Bld: 139 mg/dL — ABNORMAL HIGH (ref 70–99)
Potassium: 3.5 mmol/L (ref 3.5–5.1)
Sodium: 154 mmol/L — ABNORMAL HIGH (ref 135–145)

## 2021-03-12 LAB — TRIGLYCERIDES: Triglycerides: 111 mg/dL (ref <150)

## 2021-03-12 LAB — GLUCOSE, CAPILLARY
Glucose-Capillary: 103 mg/dL — ABNORMAL HIGH (ref 70–99)
Glucose-Capillary: 110 mg/dL — ABNORMAL HIGH (ref 70–99)
Glucose-Capillary: 113 mg/dL — ABNORMAL HIGH (ref 70–99)
Glucose-Capillary: 116 mg/dL — ABNORMAL HIGH (ref 70–99)
Glucose-Capillary: 136 mg/dL — ABNORMAL HIGH (ref 70–99)
Glucose-Capillary: 150 mg/dL — ABNORMAL HIGH (ref 70–99)
Glucose-Capillary: 152 mg/dL — ABNORMAL HIGH (ref 70–99)

## 2021-03-12 LAB — SODIUM
Sodium: 151 mmol/L — ABNORMAL HIGH (ref 135–145)
Sodium: 154 mmol/L — ABNORMAL HIGH (ref 135–145)
Sodium: 156 mmol/L — ABNORMAL HIGH (ref 135–145)

## 2021-03-12 MED ORDER — ROCURONIUM BROMIDE 10 MG/ML (PF) SYRINGE
PREFILLED_SYRINGE | INTRAVENOUS | Status: AC
  Filled 2021-03-12: qty 10

## 2021-03-12 MED ORDER — DEXMEDETOMIDINE HCL IN NACL 400 MCG/100ML IV SOLN
0.4000 ug/kg/h | INTRAVENOUS | Status: DC
  Administered 2021-03-12 (×2): 0.4 ug/kg/h via INTRAVENOUS
  Administered 2021-03-13: 1 ug/kg/h via INTRAVENOUS
  Administered 2021-03-14: 1.2 ug/kg/h via INTRAVENOUS
  Administered 2021-03-14: 1 ug/kg/h via INTRAVENOUS
  Administered 2021-03-14: 1.2 ug/kg/h via INTRAVENOUS
  Filled 2021-03-12 (×10): qty 100

## 2021-03-12 MED ORDER — ROCURONIUM BROMIDE 50 MG/5ML IV SOLN
80.0000 mg | Freq: Once | INTRAVENOUS | Status: AC
  Administered 2021-03-12: 80 mg via INTRAVENOUS

## 2021-03-12 MED ORDER — POTASSIUM CHLORIDE 20 MEQ PO PACK
40.0000 meq | PACK | Freq: Once | ORAL | Status: AC
  Administered 2021-03-12: 40 meq
  Filled 2021-03-12: qty 2

## 2021-03-12 MED ORDER — PANTOPRAZOLE SODIUM 40 MG PO PACK
40.0000 mg | PACK | Freq: Every day | ORAL | Status: DC
  Administered 2021-03-12: 40 mg
  Filled 2021-03-12: qty 20

## 2021-03-12 NOTE — Progress Notes (Signed)
Neurosurgery Service Progress Note  Subjective: No acute events overnight  Objective: Vitals:   03/12/21 0600 03/12/21 0800 03/12/21 0900 03/12/21 1000  BP: 124/72 122/72 122/72 122/76  Pulse: (!) 52 (!) 52 (!) 58 67  Resp: 16 16 16 16   Temp:      TempSrc:      SpO2: 97% 98% 94% 94%  Weight:      Height:        Physical Exam: Intubated, eyes open to voice, PERRL, gaze conjugate, makes eye contact but does not FC, moving L side spontaneously, w/d RLE, 0/5 RUE  Assessment & Plan: 40 y.o. man w/ h/o L thalamic hemorrhage, VPS, then p/w repeat hemorrhage, CTA w/ L thalamic AVM, intubated after arrival for airway protection, developed hydrocephalus s/p R F EVD 6/19. 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

## 2021-03-12 NOTE — Progress Notes (Addendum)
NAME:  Casey Branch MRN:  [id number] DOB:  1980-11-07 LOS: 4 ADMISSION DATE:  03/08/2021 CONSULTATION DATE:  03/09/2021 REFERRING MD:  Maurice Small - NSGY CHIEF COMPLAINT:  ICH, ventilator management.   History of Present Illness:  40 year old man with PMHx significant for prior ICH (?secondary to brain aneurysm vs. AVM) s/p VP shunt placement who presented to [facility] ED with AMS and unilateral R-sided weakness after a syncopal event at home (heard by wife, found patient on floor). Code Stroke was initiated on arrival to ED.  Per chart review, on EMS arrival patient was altered with L gaze deviation and R hemiplegia. BP 130/84 and CBG 107. Vomited x 2, once at home and once on arrival to ED. Initial CT Head demonstrated subarachnoid blood in the posterior fossa with ventricular extension and early ventricular dilation and VP shunt noted (present admission). Patient was subsequently intubated in ED for airway protection, given AMS and neurologic exam. Admitted by NSGY; R frontal EVD placed and HTS 3% initiated. Repeat CT showed L thalamic hemorrhage with intraventricular extension with mass effect and R frontal EVD in place.  PCCM consulted for ventilator management. Incidentally COVID+.  Pertinent Medical History:  ICH (2/2 aneurysm, s/p VP shunt placement  Significant Hospital Events: Including procedures, antibiotic start and stop dates in addition to other pertinent events   6/18 - Presented to [facility] ED via EMS with AMS/R-sided hemiplegia/L gaze preference 6/19 - PCCM consulted for ventilator management post-intubation 6/20 - Increased agitation on vent, requiring increased sedation. Opens eyes, biting ETT, not following commands. Ongoing R-sided neglect. Remdesivir + Dexamethasone started for COVID. TA sent, Unasyn for aspiration PNA coverage. 6/21 - Improved agitation overnight, appears more comfortable. WBC/glucoses bumped, likely in the setting of steroids. MRI Angiogram today. 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 Clamping EVD.   Interim History / Subjective:  6/21 cerebral angiogram with spetzler-martin grade 4 L thalamic AVM  Na 154   Biting tube, causing desats   Reportedly has moved LUE for nursing  Objective:  Blood pressure 122/72, pulse (!) 58, temperature 97.6 F (36.4 C), temperature source Oral, resp. rate 16, height 5\' 10"  (1.778 m), weight 82.9 kg, SpO2 94 %.    Vent Mode: PRVC FiO2 (%):  [30 %-100 %] 50 % Set Rate:  [16 bmp] 16 bmp Vt Set:  [580 mL] 580 mL PEEP:  [5 cmH20] 5 cmH20 Plateau Pressure:  [13 cmH20-18 cmH20] 13 cmH20   Intake/Output Summary (Last 24 hours) at 03/12/2021 1005 Last data filed at 03/12/2021 0800 Gross per 24 hour  Intake 3282.49 ml  Output 1455 ml  Net 1827.49 ml   Filed Weights   03/08/21 1200 03/11/21 0446 03/12/21 0422  Weight: 76.2 kg 81.6 kg 82.9 kg   Physical Examination: General: WDWN critically ill appearing M, intubated sedated NAD HEENT: NCAT pink mmm. ETT secure. Biting tube. Injected sclera.  Neuro: Sedated. Awakens to voice. Moving BLE spontaneously.  CV: tachycardic rate, reg rhythm. S1s2 cap refill brisk  PULM: Diffuse rhonchi. Thick tan secretions. Symmetrical chest expansion, mechanically ventilated  GI: soft round ndnt  Extremities: no acute joint deformity. LUE soft restraint. No cyanosis or clubbing Skin: c/d/w no rash  Labs/imaging that I have personally reviewed: (right click and "Reselect all SmartList Selections" daily)  Na 154   CT Head (Code Stroke) 6/18 IMPRESSION: 1. Large volume intraventricular hemorrhage primarily in the left lateral ventricle but also  in the right ventricle and third ventricle. There is clot adjacent to the tip of the basilar. 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 Hypertensive hemorrhage also possible. 2. Increased density and mild increase in thickness in the  tentorium bilaterally which may be due to layering blood. 3. Mild hydrocephalus. Left frontal ventricular peritoneal shunt catheter.  CTA Head/Neck 6/18 IMPRESSION: 1. 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 AVM supply appears to be due to the left anterior choroidal artery and left posterior cerebral artery. Deep venous drainage along the course of the straight sinus. No cerebral aneurysm 2. No intracranial stenosis or large vessel occlusion.  CT Head 6/18 IMPRESSION: 1. Slight increase in size of bilateral lateral ventricles, concerning for progressive hydrocephalus. 2. Left frontal ventriculostomy catheter in place. The anterior left ventricle is decompressed. 3. Increasing sulcal effacement over the convexities bilaterally consistent with increasing intracranial pressure. 4. Stable subarachnoid blood over the tentorium and cerebellar folia. 5. Increasing mass effect extending into the upper brainstem. 6. No new hemorrhage.  CXR 6/19 IMPRESSION: As before, the ET tube terminates just below the level of the clavicular heads. Additional lines and tubes as described. Trace right basilar atelectasis.  The lungs are otherwise clear.  AXR 6/19  IMPRESSION: Enteric tube with tip projecting in the expected location of the stomach. The side port projects in the expected location of the GE junction.  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 CT Head with L thalamic hemorrhage with intraventricular extension, hydrocephalus and mass effect.  NSGY admitted, s/p R frontal EVD placement. P -per NSGY -cont EVD -- clamping trial 6/22  -Na goal 150-155 , on 3%  -decadron -SBP goal <140   Acute hypoxic respiratory failure -- in setting of ICH COVID-19 infection -6/16 positive  Suspected superimposed aspiration PNA  -GPCs, few GNRs  P -Airborne/contact. Will come off precautions 10d after positive test (6/26) -Remdesivir, dexamethasone -Unasyn -- follow trach aspirate  - MV support, lung protective ventilation - VAP, pulm hygiene  - Weaning sedation -hoping for WUA/SBT 6/23   Best Practice (right click and "Reselect all SmartList Selections" daily)   Diet/type: tubefeeds Pain/Anxiety/Delirium protocol RASS goal: 0 to -1 VAP protocol (if indicated): Yes DVT prophylaxis: prophylactic heparin , SCDs GI prophylaxis: PPI Glucose control:  SSI Central venous access:  Yes, and it is still needed Arterial line:  N/A Foley:  Yes, and it is still needed Mobility:  bed rest  PT consulted: N/A Studies pending: None Culture data pending: none Last reviewed culture data: today Antibiotics:not indicated  Antibiotic de-escalation: N/A Stop date: N/A Daily labs: requested Code Status:  full code Last date of multidisciplinary goals of care discussion [Per Primary Team] Disposition: remains critically ill, will stay in intensive care  Critical care time:    CRITICAL CARE Performed by: Lanier Clam   Total critical care time: 37 minutes  Critical care time was exclusive of separately billable procedures and treating other patients.  Critical care was necessary to treat or prevent imminent or life-threatening deterioration.  Critical  care was time spent personally by me on the following activities: development of treatment plan with patient and/or surrogate as well as nursing, discussions with consultants, evaluation of patient's response to treatment, examination of patient, obtaining history from patient or surrogate,  ordering and performing treatments and interventions, ordering and review of laboratory studies, ordering and review of radiographic studies, pulse oximetry and re-evaluation of patient's condition.   Tessie Fass MSN, AGACNP-BC [facility] Amion for pager  03/12/2021, 10:05 AM    Attending attestation:  Ms. Barnhard is a 40 y/o gentleman with a history of thalamic IPH and VP shunt who presented with recurrent IPH of the thalamus. He remains intubated. Clamping EVD today.  BP 117/70   Pulse (!) 58   Temp 98.7 F (37.1 C) (Axillary)   Resp 17   Ht 5\' 10"  (1.778 m)   Wt 82.9 kg   SpO2 94%   BMI 26.22 kg/m  Critically ill appearing man lying in bed in NAD EVD- no bleeding or fluctuance. Eyes anicteric Biting on ETT constantly. Moving BLE, but not spontaneously moving upper extremities during exam. Tracking some. Strong cough reflex. S1S2, tachycardic Breathing around his ETT due to biting, mild rhonchi. Thick voluminous yellow tracheal secretions Abd soft, NT No LE edema, no cyanosis Skin warm, dry.  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 Monitor clinically for changes in mental status -Con't DVT prophylaxis -Will eventually need radiotherapy ablation of AVM -Con't hypertonic saline for elevated ICP; serial Na+ monitoring. Goal Na+ 150-155. -PT, OT. Will need SLP evaluation.  Acute respiratory failure with hypoxia requiring MV RLL aspiration pneumonia -LTVV, 4-8cc/kg IBW with goal Pplat<30 and DP<15 -VAP prevention protocol -PAD protocol for sedation -daily SAT & SBT; deferring extubation today due to EVD clamping trial and ongoing large volume secretions -con't to follow trach aspirate culture -con't unasyn; deescalate as able -bite block placed to prevent hypoventilation from  biting ETT  Covid 19 infection -verified first + test on 6/16 ([facility]; wife has results) -con't isolation -remdesivir and dexamethasone x 5 days  Hyperglycemia -SSI PRN -goal BG 140-180  This patient is critically ill with multiple organ system failure which requires frequent high complexity decision making, assessment, support, evaluation, and titration of therapies. This was completed through the application of advanced monitoring technologies and extensive interpretation of multiple databases. During this encounter critical care time was devoted to patient care services described in this note for 45 minutes.  7/16, DO 03/12/21 2:30 PM [facility]

## 2021-03-12 NOTE — Progress Notes (Signed)
Initial Nutrition Assessment  DOCUMENTATION CODES:   Not applicable  INTERVENTION:   Tube Feeding via Cortrak tube: Vital 1.5 at 55 ml/hr Pro-Source TF 45 mL BID Provides 2060 kcals, 111 g of protein and 1003 mL of free water  TF regimen and propofol at current rate providing 2535 total kcal/day   NUTRITION DIAGNOSIS:   Inadequate oral intake related to acute illness as evidenced by NPO status. Ongoing.   GOAL:   Patient will meet greater than or equal to 90% of their needs Met with TF.   MONITOR:   Vent status, TF tolerance, Weight trends, Labs  REASON FOR ASSESSMENT:   Ventilator, Consult Enteral/tube feeding initiation and management  ASSESSMENT:   40 yo male with h/o prior ICH with VPS who presents with confusion followed by LOC with N/V and admitted with likely recurrent AVM hemorrhage-VPS likely non-functional. Pt developed respiratory failure due to encephalopathy from brain bleed requiring intubation. COVID-19+-incidental finding.  Pt discussed during ICU rounds and with RN.  Cleviprex and pressor off.   6/22 EVD clamping trial, adding precedex; cortrak placed - tip gastric   Patient is currently intubated on ventilator support MV: 10.1 L/min Temp (24hrs), Avg:98.1 F (36.7 C), Min:97.5 F (36.4 C), Max:98.7 F (37.1 C)  Propofol: 18 ml/hr provides: 475 kcal   Medications reviewed and include: decadron, colace, SSI every 4 hours, protonix, miralax, senokot  Precedex  Fentanyl  Remdesivir  Hypertonic saline 3%  Labs reviewed: Na 154 CBG's: 113-150   OG tube: tip in stomach with side port projecting at expected location of GE junction. - contacted provider about concerns and plan for a Cortrak   Diet Order:   Diet Order             Diet NPO time specified  Diet effective now                   EDUCATION NEEDS:   Not appropriate for education at this time  Skin:  Skin Assessment: Reviewed RN Assessment  Last BM:  PTA  Height:    Ht Readings from Last 1 Encounters:  03/08/21 5' 10"  (1.778 m)    Weight:   Wt Readings from Last 1 Encounters:  03/12/21 82.9 kg     BMI:  Body mass index is 26.22 kg/m.  Estimated Nutritional Needs:   Kcal:  1900-2200 kcals  Protein:  105-130 g  Fluid:  >/= 2 L  Tynan Boesel P., RD, LDN, CNSC See AMiON for contact information

## 2021-03-12 NOTE — Progress Notes (Signed)
eLink Physician-Brief Progress Note Patient Name: Damani Kelemen DOB: 03/11/1981 MRN: [id number]   Date of Service  03/12/2021  HPI/Events of Note  Hypernatremia - Na+ = 156. Currently on 3% NaCl IV infusion.  eICU Interventions  Plan: Decrease 3% NaCl to 25 mL/hour.     Intervention Category Major Interventions: Electrolyte abnormality - evaluation and management  Kadarrius Yanke Eugene 03/12/2021, 8:06 PM

## 2021-03-13 ENCOUNTER — Inpatient Hospital Stay (HOSPITAL_COMMUNITY): Payer: Self-pay

## 2021-03-13 DIAGNOSIS — J154 Pneumonia due to other streptococci: Secondary | ICD-10-CM

## 2021-03-13 LAB — CBC
HCT: 34.8 % — ABNORMAL LOW (ref 39.0–52.0)
Hemoglobin: 11.6 g/dL — ABNORMAL LOW (ref 13.0–17.0)
MCH: 30.9 pg (ref 26.0–34.0)
MCHC: 33.3 g/dL (ref 30.0–36.0)
MCV: 92.6 fL (ref 80.0–100.0)
Platelets: 207 10*3/uL (ref 150–400)
RBC: 3.76 MIL/uL — ABNORMAL LOW (ref 4.22–5.81)
RDW: 14.4 % (ref 11.5–15.5)
WBC: 12.1 10*3/uL — ABNORMAL HIGH (ref 4.0–10.5)
nRBC: 0.4 % — ABNORMAL HIGH (ref 0.0–0.2)

## 2021-03-13 LAB — BASIC METABOLIC PANEL
Anion gap: 5 (ref 5–15)
BUN: 20 mg/dL (ref 6–20)
CO2: 23 mmol/L (ref 22–32)
Calcium: 8.6 mg/dL — ABNORMAL LOW (ref 8.9–10.3)
Chloride: 127 mmol/L — ABNORMAL HIGH (ref 98–111)
Creatinine, Ser: 1.02 mg/dL (ref 0.61–1.24)
GFR, Estimated: >60 mL/min (ref >60)
Glucose, Bld: 117 mg/dL — ABNORMAL HIGH (ref 70–99)
Potassium: 3.7 mmol/L (ref 3.5–5.1)
Sodium: 155 mmol/L — ABNORMAL HIGH (ref 135–145)

## 2021-03-13 LAB — SODIUM
Sodium: 155 mmol/L — ABNORMAL HIGH (ref 135–145)
Sodium: 160 mmol/L — ABNORMAL HIGH (ref 135–145)

## 2021-03-13 LAB — GLUCOSE, CAPILLARY
Glucose-Capillary: 108 mg/dL — ABNORMAL HIGH (ref 70–99)
Glucose-Capillary: 109 mg/dL — ABNORMAL HIGH (ref 70–99)
Glucose-Capillary: 114 mg/dL — ABNORMAL HIGH (ref 70–99)
Glucose-Capillary: 126 mg/dL — ABNORMAL HIGH (ref 70–99)
Glucose-Capillary: 147 mg/dL — ABNORMAL HIGH (ref 70–99)
Glucose-Capillary: 148 mg/dL — ABNORMAL HIGH (ref 70–99)

## 2021-03-13 LAB — TRIGLYCERIDES: Triglycerides: 110 mg/dL (ref <150)

## 2021-03-13 MED ORDER — HALOPERIDOL LACTATE 5 MG/ML IJ SOLN
2.0000 mg | Freq: Four times a day (QID) | INTRAMUSCULAR | Status: DC | PRN
  Administered 2021-03-13: 2 mg via INTRAVENOUS
  Filled 2021-03-13: qty 1

## 2021-03-13 MED ORDER — CEFAZOLIN SODIUM-DEXTROSE 2-4 GM/100ML-% IV SOLN
2.0000 g | Freq: Three times a day (TID) | INTRAVENOUS | Status: DC
  Administered 2021-03-13: 2 g via INTRAVENOUS
  Filled 2021-03-13 (×2): qty 100

## 2021-03-13 MED ORDER — ACETAMINOPHEN 650 MG RE SUPP
650.0000 mg | Freq: Four times a day (QID) | RECTAL | Status: DC | PRN
  Administered 2021-03-13 – 2021-03-14 (×2): 650 mg via RECTAL
  Filled 2021-03-13 (×2): qty 1

## 2021-03-13 MED ORDER — PANTOPRAZOLE SODIUM 40 MG IV SOLR
40.0000 mg | Freq: Every day | INTRAVENOUS | Status: DC
  Administered 2021-03-13: 40 mg via INTRAVENOUS
  Filled 2021-03-13: qty 40

## 2021-03-13 MED ORDER — VANCOMYCIN HCL 1250 MG/250ML IV SOLN
1250.0000 mg | Freq: Two times a day (BID) | INTRAVENOUS | Status: DC
  Administered 2021-03-13 – 2021-03-14 (×2): 1250 mg via INTRAVENOUS
  Filled 2021-03-13 (×2): qty 250

## 2021-03-13 MED ORDER — ORAL CARE MOUTH RINSE
15.0000 mL | Freq: Two times a day (BID) | OROMUCOSAL | Status: DC
  Administered 2021-03-13 – 2021-03-15 (×5): 15 mL via OROMUCOSAL

## 2021-03-13 MED ORDER — SODIUM CHLORIDE 0.9 % IV SOLN
2.0000 g | Freq: Three times a day (TID) | INTRAVENOUS | Status: AC
  Administered 2021-03-13 – 2021-03-16 (×9): 2 g via INTRAVENOUS
  Filled 2021-03-13 (×9): qty 2

## 2021-03-13 MED ORDER — LABETALOL HCL 5 MG/ML IV SOLN
5.0000 mg | INTRAVENOUS | Status: DC | PRN
  Administered 2021-03-13: 10 mg via INTRAVENOUS
  Filled 2021-03-13: qty 4

## 2021-03-13 NOTE — Progress Notes (Signed)
Patient noted to have vomited, cortrak had been thrown up and dislodged. Cortrak has been removed from the bridle and placed into a biohazard bag at this time. Tube feeds will remain off until cortrak replaced.

## 2021-03-13 NOTE — Progress Notes (Signed)
Pharmacy Antibiotic Note  Casey Branch is a 40 y.o. male admitted on 03/08/2021 with pneumonia.  Pharmacy has been consulted for Unasyn dosing.  Pt was admitted for AVM hemorrhage and COVID. Tm 100.5. Suspected for asp PNA in addition to COVID. Start remdesivir and Unasyn.    He has been on 4d of abx now for PNA. His abx was narrowed from Unasyn to cefazolin due to neg MRSA PCR. He spiked a temp of 101 today. CCM would like to broaden abx since pt also has an EVD in place.   Plan: Dc cefazolin Vanc 1.25g IV q12>>AUC 472 scr 1 Cefepime 2g IV q8 Levels as needed   Height: 5\' 10"  (177.8 cm) Weight: 82.9 kg (182 lb 12.2 oz) IBW/kg (Calculated) : 73  Temp (24hrs), Avg:101 F (38.3 C), Min:101 F (38.3 C), Max:101 F (38.3 C)  Recent Labs  Lab 03/08/21 1212 03/08/21 1213 03/09/21 0736 03/10/21 0444 03/11/21 0556 03/12/21 0544 03/13/21 0254  WBC 5.5  --   --   --  12.2*  --  12.1*  CREATININE 1.03   < > 1.03 0.92 0.90 0.91 1.02   < > = values in this interval not displayed.     Estimated Creatinine Clearance: 99.4 mL/min (by C-G formula based on SCr of 1.02 mg/dL).    No Known Allergies  Antimicrobials this admission: 6/20 unasyn>>6/23 6/23 cefazolin>>6/23 6/23 vanc>> 6/23 cefepime>> 6/20 remdesivir>>6/24  Dose adjustments this admission:   Microbiology results: 6/20 TA>>group C strep, SA  7/20, PharmD, BCIDP, AAHIVP, CPP Infectious Disease Pharmacist 03/13/2021 4:45 PM

## 2021-03-13 NOTE — Progress Notes (Signed)
Neurosurgery Service Progress Note  Subjective: No neurologic issues overnight, stable exam with drain clamped x24h  Objective: Vitals:   03/13/21 0700 03/13/21 0753 03/13/21 0800 03/13/21 0830  BP: 120/77  (!) 151/86 132/82  Pulse: (!) 47 (!) 57 63   Resp: 16  20   Temp:      TempSrc:      SpO2: 92%  97%   Weight:      Height:        Physical Exam: Intubated, eyes open to voice, PERRL, gaze conjugate, makes eye contact but does not FC, moving L side spontaneously, w/d RLE, 0/5 RUE  Assessment & Plan: 40 y.o. man w/ h/o L thalamic hemorrhage, VPS, then p/w repeat hemorrhage, CTA w/ L thalamic AVM, intubated after arrival for airway protection, developed hydrocephalus s/p R F EVD 6/19. 6/21 catheter angio w/ SM grade 4 AVM, 6/22 EVD clamp trial, 6/23 passed trial & EVD removed  -outpt SRS for AVM -exam stable x24h with clamped EVD, removed this morning on rounds -CCM recs  Maisie Fus A Laterria Lasota  03/13/21 10:00 AM

## 2021-03-13 NOTE — Progress Notes (Signed)
Contacted Dr. Chestine Spore regarding patient's temperature. She is inputting new orders for tylenol and CXR.   03/13/21 1600  Vitals  Temp (!) 101 F (38.3 C)  Temp Source Axillary

## 2021-03-13 NOTE — Progress Notes (Signed)
NAME:  Casey Branch MRN:  [id number] DOB:  May 08, 1981 LOS: 5 ADMISSION DATE:  03/08/2021 CONSULTATION DATE:  03/09/2021 REFERRING MD:  Maurice Small - NSGY CHIEF COMPLAINT:  ICH, ventilator management.   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 Code Stroke was initiated on arrival to ED.  Per chart review, on EMS arrival patient was altered with L gaze deviation and R hemiplegia. BP 130/84 and CBG 107. Vomited x 2, once at home and once on arrival to ED. Initial CT Head demonstrated subarachnoid blood in the posterior fossa with ventricular extension and early ventricular dilation and VP shunt noted (present admission). Patient was subsequently intubated in ED for airway protection, given AMS and neurologic exam. Admitted by NSGY; R frontal EVD placed and HTS 3% initiated. Repeat CT showed L thalamic hemorrhage with intraventricular extension with mass effect and R frontal EVD in place.  PCCM consulted for ventilator management. Incidentally COVID+.  Pertinent Medical History:  ICH (2/2 aneurysm, s/p VP shunt placement  Significant Hospital Events: Including procedures, antibiotic start and stop dates in addition to other pertinent events   6/18 - Presented to [facility] ED via EMS with AMS/R-sided hemiplegia/L gaze preference 6/19 - PCCM consulted for ventilator management post-intubation 6/20 - Increased agitation on vent, requiring increased sedation. Opens eyes, biting ETT, not following commands. Ongoing R-sided neglect. Remdesivir + Dexamethasone started for COVID. TA sent, Unasyn for aspiration PNA coverage. 6/21 - Improved agitation overnight, appears more comfortable. WBC/glucoses bumped, likely in the setting of steroids. MRI Angiogram today. 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 Clamping EVD. Switch precedex for propofol. 6/23 EVD removed  Interim History / Subjective:  Vomited out cortrak overnight. This morning more alert, R gaze preference when awake. Has been moving L arm, both legs spontaneously but not following commands.  Objective:  Blood pressure 132/82, pulse 63, temperature 98.7 F (37.1 C), temperature source Axillary, resp. rate 20, height 5\' 10"  (1.778 m), weight 82.9 kg, SpO2 97 %.    Vent Mode: PRVC FiO2 (%):  [40 %-60 %] 40 % Set Rate:  [16 bmp] 16 bmp Vt Set:  [580 mL] 580 mL PEEP:  [5 cmH20-8 cmH20] 8 cmH20 Plateau Pressure:  [18 cmH20-21 cmH20] 19 cmH20   Intake/Output Summary (Last 24 hours) at 03/13/2021 0913 Last data filed at 03/13/2021 0800 Gross per 24 hour  Intake 3329.38 ml  Output 2130 ml  Net 1199.38 ml    Filed Weights   03/08/21 1200 03/11/21 0446 03/12/21 0422  Weight: 76.2 kg 81.6 kg 82.9 kg   Physical Examination: General: critically ill appearing man lying in bed in NAD HEENT: EVD removed. Normocephalic otherwise, ETT in place with moist oral mucosa. Neuro: Examined on precedex. RASS -1, R gaze preference. Not reliably following commands- possibly trying to lift head off bed and protrude tongue. Strong cough and gag reflexes. Spontaneously moveds BLE, lifts left arm off the be. Flaccid RUE. On re-exam, able to follow commands with LUE, tracking more.Very strong cough and gag. CV: S1S2, bradycardic, reg rhythm PULM: rhonchi improved, less secretions, still tan. Breathing comfortably on PS 8+ CPAP 8 GI: soft, NT Extremities: no clubbing, no cyanosis. No LE edema. RUE  BP cuff- slightly larger girth compared to left. Skin: stasis dermatitis, no rashes  Labs/imaging that I have personally reviewed: (right click and "Reselect all SmartList Selections" daily)  Na 155 K+3.7 BUN 20 Cr 1.02 WBC 12.1 CXR  personally reviewed > improving RLL infiltrate Trach aspirate> abundant strep group C, staph aureus  Resolved Hospital Problem List:     Assessment & Plan:   Acute encephalopathy in setting of ICH  -Goal RASS 0 to -1. PAD protocol for sedation. Precedex and fentanyl. 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 CT Head with L thalamic hemorrhage with intraventricular extension, hydrocephalus and mass effect.  -EVD removed this morning after clamping trial. Appreciate NS's management. -3% saline at 25cc/h. Na+ at goal.  -SBP goal <140   Acute hypoxic respiratory failure requiring MV due to encephalopathy COVID-19 infection -6/16 confirmed positive test  RLL aspiration PNA - strep group C and staph -Airborne/contact precautions until 6/26.  -Remdesivir, dexamethasone x 5 days -LTVV, SAT & SBT today-- passed on Ps 8, extubating - VAP prevention protocol, pulm hygiene  - Weaning sedation> turning off precedex after extubation -wean FiO2 as able  Acute anemia due to critical illness -transfuse for Hb< 7 or hemodynamically significant bleeding -monitor   Hyperglycemia, minimal insulin requirements -SSI PRN -goal BG 140-180   Best Practice (right click and "Reselect all SmartList Selections" daily)   Diet/type: NPO Pain/Anxiety/Delirium protocol RASS goal: 0 to -1 VAP protocol (if indicated): Yes DVT prophylaxis: prophylactic heparin , SCDs GI prophylaxis: PPI Glucose control:  SSI Central venous access:  Yes, and it is still needed -3% saline Arterial line:  N/A Foley:  N/A Mobility:  bed rest  PT consulted: N/A Studies pending: None Culture data pending: none Antibiotics: yes Code Status:  full code Last date of multidisciplinary goals  of care discussion [Per Primary Team] Disposition: remains critically ill, will stay in intensive care  Critical care time:     This patient is critically ill with multiple organ system failure which requires frequent high complexity decision making, assessment, support, evaluation, and titration of therapies. This was completed through the application of advanced monitoring technologies and extensive interpretation of multiple databases. During this encounter critical care time was devoted to patient care services described in this note for 46 minutes.  Steffanie Dunn, DO 03/13/21 10:16 AM [facility]

## 2021-03-13 NOTE — Progress Notes (Addendum)
Extubated this morning. On afternoon rounds more fidgeting. Had an episode of agitation and lower O2 saturations a few minutes ago, was placed on NRB from salter.  BP (!) 146/76   Pulse (!) 102   Temp 98.7 F (37.1 C) (Axillary)   Resp (!) 30   Ht 5\' 10"  (1.778 m)   Wt 82.9 kg   SpO2 (!) 89%   BMI 26.22 kg/m  Awake, tracking more than this morning. Both lower legs off the edge of the bed and he is laying more horizontal in the bed. Mild tachypnea, no accessory muscle use. CTAB. No coughing or apparent respiratory secretions. Saturating 93-94% on NRB. HR 80, irreg rhythm possibly hallucinating- batting at something in he air with his left hand. Not combative during repositioning.  Will put posey belt in place to prevent him from sliding off the bed. Restarting low-dose precedex. Con't close airway monitoring, but without tachycardia or concerning respiratory secretions, he seems to be more agitated than in respiratory failure.  , DO 03/13/21 2:51 PM [facility]   Re-examined after haldol. Now sleeping. 94% on NRB, RR ~30, no accessory muscle use. Con't monitoring.  03/15/21, DO 03/13/21 3:35 PM [facility]     New fever. Abx had been narrowed for poluymicrobial aspiration pneumonia. EVD out this morning.   Blood cultures Remove CVC (hypertonic saline off this afternoon) after PIV access Escalate to vanc and cefepime CXR Tylenol suppository  03/15/21, DO 03/13/21 4:41 PM [facility]   Called to bedside - still on NRB saturating around 94%. Drops to 90 and RR increased with agitation, but then calms down on his own with RR ~20. Still no rhonchi or rhales, no accessory respiratory muscle use. Wife updated at bedside.  BP (!) 151/90   Pulse (!) 54   Temp (!) 101 F (38.3 C) (Axillary)   Resp (!) 29   Ht 5\' 10"  (1.778 m)   Wt 82.9 kg   SpO2 96%   BMI 26.22 kg/m    03/15/21, DO 03/13/21 6:03 PM [facility]

## 2021-03-13 NOTE — Procedures (Signed)
Extubation Procedure Note  Patient Details:   Name: Casey Branch DOB: 07-05-1981 MRN: [id number]   Airway Documentation:    Vent end date: 03/13/21 Vent end time: 1020   Evaluation  O2 sats: stable throughout Complications: No apparent complications Patient did tolerate procedure well. Bilateral Breath Sounds: Rhonchi   No   Nurah Petrides 03/13/2021, 10:32 AM

## 2021-03-14 ENCOUNTER — Inpatient Hospital Stay (HOSPITAL_COMMUNITY): Payer: Self-pay

## 2021-03-14 DIAGNOSIS — I6389 Other cerebral infarction: Secondary | ICD-10-CM

## 2021-03-14 DIAGNOSIS — M7989 Other specified soft tissue disorders: Secondary | ICD-10-CM

## 2021-03-14 DIAGNOSIS — J9621 Acute and chronic respiratory failure with hypoxia: Secondary | ICD-10-CM

## 2021-03-14 DIAGNOSIS — I82409 Acute embolism and thrombosis of unspecified deep veins of unspecified lower extremity: Secondary | ICD-10-CM

## 2021-03-14 LAB — BASIC METABOLIC PANEL
Anion gap: 13 (ref 5–15)
BUN: 15 mg/dL (ref 6–20)
CO2: 22 mmol/L (ref 22–32)
Calcium: 9 mg/dL (ref 8.9–10.3)
Chloride: 108 mmol/L (ref 98–111)
Creatinine, Ser: 1.02 mg/dL (ref 0.61–1.24)
GFR, Estimated: >60 mL/min (ref >60)
Glucose, Bld: 78 mg/dL (ref 70–99)
Potassium: 3.1 mmol/L — ABNORMAL LOW (ref 3.5–5.1)
Sodium: 143 mmol/L (ref 135–145)

## 2021-03-14 LAB — CBC
HCT: 37.6 % — ABNORMAL LOW (ref 39.0–52.0)
Hemoglobin: 13.3 g/dL (ref 13.0–17.0)
MCH: 32 pg (ref 26.0–34.0)
MCHC: 35.4 g/dL (ref 30.0–36.0)
MCV: 90.6 fL (ref 80.0–100.0)
Platelets: 246 10*3/uL (ref 150–400)
RBC: 4.15 MIL/uL — ABNORMAL LOW (ref 4.22–5.81)
RDW: 13.4 % (ref 11.5–15.5)
WBC: 15.3 10*3/uL — ABNORMAL HIGH (ref 4.0–10.5)
nRBC: 0.9 % — ABNORMAL HIGH (ref 0.0–0.2)

## 2021-03-14 LAB — CULTURE, RESPIRATORY W GRAM STAIN

## 2021-03-14 LAB — GLUCOSE, CAPILLARY
Glucose-Capillary: 103 mg/dL — ABNORMAL HIGH (ref 70–99)
Glucose-Capillary: 108 mg/dL — ABNORMAL HIGH (ref 70–99)
Glucose-Capillary: 110 mg/dL — ABNORMAL HIGH (ref 70–99)
Glucose-Capillary: 126 mg/dL — ABNORMAL HIGH (ref 70–99)
Glucose-Capillary: 86 mg/dL (ref 70–99)

## 2021-03-14 LAB — ECHOCARDIOGRAM LIMITED
Calc EF: 66.7 %
Height: 70 in
S' Lateral: 3 cm
Single Plane A2C EF: 69.1 %
Single Plane A4C EF: 62.8 %
Weight: 2924.18 oz

## 2021-03-14 LAB — MAGNESIUM: Magnesium: 1.5 mg/dL — ABNORMAL LOW (ref 1.7–2.4)

## 2021-03-14 MED ORDER — POTASSIUM CHLORIDE 10 MEQ/100ML IV SOLN
10.0000 meq | INTRAVENOUS | Status: AC
  Administered 2021-03-14 (×4): 10 meq via INTRAVENOUS
  Filled 2021-03-14 (×3): qty 100

## 2021-03-14 MED ORDER — VANCOMYCIN HCL IN DEXTROSE 1-5 GM/200ML-% IV SOLN
1000.0000 mg | Freq: Three times a day (TID) | INTRAVENOUS | Status: DC
  Administered 2021-03-14 – 2021-03-16 (×5): 1000 mg via INTRAVENOUS
  Filled 2021-03-14 (×5): qty 200

## 2021-03-14 MED ORDER — MAGNESIUM SULFATE 2 GM/50ML IV SOLN
2.0000 g | Freq: Once | INTRAVENOUS | Status: AC
  Administered 2021-03-14: 2 g via INTRAVENOUS
  Filled 2021-03-14: qty 50

## 2021-03-14 MED ORDER — ACETAMINOPHEN 10 MG/ML IV SOLN
1000.0000 mg | Freq: Once | INTRAVENOUS | Status: AC
  Administered 2021-03-14: 1000 mg via INTRAVENOUS
  Filled 2021-03-14: qty 100

## 2021-03-14 MED ORDER — HALOPERIDOL LACTATE 5 MG/ML IJ SOLN
5.0000 mg | Freq: Four times a day (QID) | INTRAMUSCULAR | Status: DC | PRN
  Administered 2021-03-14 – 2021-03-15 (×3): 5 mg via INTRAVENOUS
  Filled 2021-03-14 (×3): qty 1

## 2021-03-14 NOTE — Progress Notes (Signed)
Lower extremity venous bilateral study completed.  Preliminary results relayed to Smith, MD.  See CV Proc for preliminary results report.   Mahoganie Basher, RDMS, RVT  

## 2021-03-14 NOTE — Progress Notes (Addendum)
NAME:  Casey Branch MRN:  [id number] DOB:  July 27, 1981 LOS: 6 ADMISSION DATE:  03/08/2021 CONSULTATION DATE:  03/09/2021 REFERRING MD:  Maurice Small - NSGY CHIEF COMPLAINT:  ICH, ventilator management.   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 Code Stroke was initiated on arrival to ED.  Per chart review, on EMS arrival patient was altered with L gaze deviation and R hemiplegia. BP 130/84 and CBG 107. Vomited x 2, once at home and once on arrival to ED. Initial CT Head demonstrated subarachnoid blood in the posterior fossa with ventricular extension and early ventricular dilation and VP shunt noted (present admission). Patient was subsequently intubated in ED for airway protection, given AMS and neurologic exam. Admitted by NSGY; R frontal EVD placed and HTS 3% initiated. Repeat CT showed L thalamic hemorrhage with intraventricular extension with mass effect and R frontal EVD in place.  PCCM consulted for ventilator management. Incidentally COVID+.  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 Opens eyes, biting ETT, not following commands. Ongoing R-sided neglect. Remdesivir + Dexamethasone started for COVID. TA sent, Unasyn for aspiration PNA coverage. 6/21 - Improved agitation overnight, appears more comfortable. WBC/glucoses bumped, likely in the setting of steroids. MRI Angiogram today. 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 Clamping EVD. Switch precedex for propofol. 6/23 EVD removed, extubated  Interim History / Subjective:  Agitated after extubation, now on precedex gtt, spiking fevers. Abx broadened.  Objective:  Blood pressure 122/77, pulse 94, temperature (!) 102.8 F (39.3 C), temperature source Axillary, resp. rate (!) 31, height 5\' 10"  (1.778 m), weight 82.9 kg, SpO2 90 %.    Vent Mode: Stand-by   Intake/Output Summary (Last 24 hours) at 03/14/2021 0902 Last data filed at 03/14/2021 0800 Gross per 24 hour  Intake 1830.21 ml  Output 3800 ml  Net -1969.79 ml    Filed Weights   03/08/21 1200 03/11/21 0446 03/12/21 0422  Weight: 76.2 kg 81.6 kg 82.9 kg   Physical Examination: Constitutional: tachypneic man laying in bed  Eyes: R gaze preference, tracks intermittently, pupils equal Ears, nose, mouth, and throat: MM dry, NRB in place Cardiovascular: Irregular due to PVCs, ext warm Respiratory: Scattered rhonci, tachypneic in low 30s Gastrointestinal: soft, +BS Skin: No rashes, normal turgor Ext: R > L arm swelling Neurologic: Moves left side to command Psychiatric: RASS 0   Labs/imaging that I have personally reviewed: (right click and "Reselect all SmartList Selections" daily)  Na 155 K+3.7 BUN 20 Cr 1.02 WBC 12.1 CXR personally reviewed > improving RLL infiltrate Trach aspirate> abundant strep group C, staph aureus  Resolved Hospital Problem List:     Assessment & Plan:   Acute encephalopathy in setting of ICH, delirium superimposed on question developing sepsis with fevers - Redirect as able - Try to wean from precedex to PRN haldol - Stop steroids to reduce deliriogenic effect  L thalamic  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  - NRB in place, agitation and fever seem to be drivers of respiratory distress  Fevers- abx broadened 6/23 - Vanc/cefepime - check Pct - Wean precedex question if this is central vs. Precedex-associated  Dysphagia- not ready for SLP eval - Replace cortrak, TF  Ectopy- check Mg/K/ EKG  R arm swelling- DVT confirmed on duplex 6/24; ?cause of fever - Heparin DVT ppx level for now, check LE duplex; filter if positive.  Will touch base with NSGY regarding. - Check limited echo; if showing RH strain or clot in transit may need to be more aggressive and consider mechanical thrombectomy  Best practice:  Diet: NPO Pain/Anxiety/Delirium protocol (if indicated): try to switch to haldol PRN VAP protocol (if indicated): N/A DVT prophylaxis: heparin GI prophylaxis: dc Glucose control: ssi Mobility: BR Code Status: full Family Communication: per primary Disposition: ICU pending respiratory stabilization and precedex wean   Patient critically ill due to respiratory failure, thalamic hemorrhage, delirium Interventions to address this today precedex wean, CCB gtt wean Risk of deterioration without these interventions is high  I personally spent 55 minutes providing critical care not including any separately billable procedures  Myrla Halsted MD [facility]  Prefer epic messenger for cross cover needs If after hours, please call E-link

## 2021-03-14 NOTE — Evaluation (Signed)
Physical Therapy Evaluation Patient Details Name: Casey Branch MRN: [id number] DOB: [birth year] Today's Date: 03/14/2021   History of Present Illness  Pt is a 40 y.o. male who presented 6/18 with a headache, confusion, then LOC with nausea and vomiting. 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 S/p R frontal ventriculostomy 6/18. Developed hydrocephalus s/p R F EVD 6/19. ETT 6/19 - 6/23. S/p diagnostic cerebral angiogram 6/21. EVD removed 6/23. Pt found to be COVID + on admission. + Rt UE DVT 6/24; ectopy and PVC 6/24 PMH: h/o prior intracranial hemorrhage with VPS.   Clinical Impression  Pt presents with condition above and deficits mentioned below, see PT Problem List. Pt very lethargic this date, opening eyes for a brief period but not following any commands or assisting with mobility. Thus, pt needing TA x2 for all bed mobility and sitting balance this date. Through pt spontaneously moving legs prior to PT entering room and noted AROM of legs to pull away from noxious stimuli distally at each foot, he tends to display some weakness in his R lower extremity compared to his L. Unable to formally assess secondary to pt lethargy. PTA, he was living with his wife, and per chart, was independent. Feel he will likely benefit from CIR once lethargy and arousal improves. Will continue to follow acutely.     Follow Up Recommendations CIR;Supervision/Assistance - 24 hour    Equipment Recommendations  Other (comment) (TBD)    Recommendations for Other Services Rehab consult     Precautions / Restrictions Precautions Precautions: Fall Precaution Comments: periods of agitation requiring restraints and haldol; L wrist restraint; posey belt; SBP goal < 140 Restrictions Weight Bearing Restrictions: No      Mobility  Bed Mobility Overal bed mobility: Needs Assistance Bed Mobility: Supine to Sit;Sit to Supine     Supine to sit: Total assist;+2 for physical assistance;+2 for  safety/equipment;HOB elevated Sit to supine: Total assist;+2 for physical assistance;+2 for safety/equipment;HOB elevated   General bed mobility comments: required assist for all aspects - pt 0%    Transfers Overall transfer level: Needs assistance               General transfer comment: unable to attempt due to decreased responsiveness  Ambulation/Gait             General Gait Details: unable to attempt due to decreased responsiveness  Stairs            Wheelchair Mobility    Modified Rankin (Stroke Patients Only) Modified Rankin (Stroke Patients Only) Pre-Morbid Rankin Score: No symptoms Modified Rankin: Severe disability     Balance Overall balance assessment: Needs assistance Sitting-balance support: No upper extremity supported;Feet supported Sitting balance-Leahy Scale: Zero Sitting balance - Comments: required total A to maintain EOB sitting - pt 0%       Standing balance comment: unable to attempt                             Pertinent Vitals/Pain Pain Assessment: Faces Pain Score: 0-No pain Faces Pain Scale: Hurts a little bit Pain Location: pulls away with noxious stimuli to lower extremities Pain Descriptors / Indicators:  (pulls away) Pain Intervention(s): Limited activity within patient's tolerance;Monitored during session;Repositioned    Home Living Family/patient expects to be discharged to:: Unsure                 Additional Comments: Pt unable to provide info and  no family present    Prior Function           Comments: per chart, pt appears to have been independent, but pt unable to provide info and no family present     Hand Dominance   Dominant Hand:  (unknown)    Extremity/Trunk Assessment   Upper Extremity Assessment Upper Extremity Assessment: Defer to OT evaluation RUE Deficits / Details: edema noted.  Pt diagnosed with DVT today - unable to be anticoagulated per RN.  ROM not assessed.  Noted  minimal activel movement prior to therapist's entrance into room RUE Coordination: decreased gross motor;decreased fine motor LUE Deficits / Details: Pt noted with spontaneous movement prior to therapist's entrance, but did not actively move Lt UE during eval. PROM grossly [facility]    Lower Extremity Assessment Lower Extremity Assessment: RLE deficits/detail;LLE deficits/detail RLE Deficits / Details: Pt noted with spontaneous movement prior to therapist's entrance, and able to pull lower extremity away from noxious stimuli with good knee and hip flexion AROM while supine in bed RLE Sensation:  (unable to formally assess, detects noxious stimuli distally) RLE Coordination: decreased gross motor LLE Deficits / Details: Pt noted with spontaneous movement prior to therapist's entrance, and able to pull lower extremity away from noxious stimuli but did not achieve as full knee and hip flexion AROM supine in bed as he did on L LLE Sensation:  (unable to formally assess, detects noxious stimuli distally) LLE Coordination: decreased gross motor       Communication   Communication: Other (comment) (Pt non verbal and followed no commands)  Cognition Arousal/Alertness: Lethargic Behavior During Therapy: Flat affect Overall Cognitive Status: Impaired/Different from baseline                                 General Comments: Pt lethargic with very minimal eye opening and grimacing to noxious stimuli.  RN reports pt had increasing restlessness and recently had Haldol.  Just prior to therapy entrance, pt was restless and moving spontaneously in bed, but responded very minimally to therapists' stimuli during eval      General Comments General comments (skin integrity, edema, etc.): Pt maintained Sp02 94% on 10L hiflo 02 during session.  HR 42-68 with ? V-Fib, per monitor - RN aware. Mild drainage from scalp incision - RN aware    Exercises     Assessment/Plan    PT Assessment Patient needs  continued PT services  PT Problem List Decreased strength;Decreased range of motion;Decreased activity tolerance;Decreased balance;Decreased mobility;Decreased coordination;Decreased cognition;Decreased knowledge of use of DME;Decreased safety awareness       PT Treatment Interventions DME instruction;Gait training;Stair training;Functional mobility training;Therapeutic exercise;Therapeutic activities;Balance training;Neuromuscular re-education;Cognitive remediation;Patient/family education;Wheelchair mobility training    PT Goals (Current goals can be found in the Care Plan section)  Acute Rehab PT Goals Patient Stated Goal: did not state PT Goal Formulation: Patient unable to participate in goal setting Time For Goal Achievement: 03/28/21 Potential to Achieve Goals: Good    Frequency Min 4X/week   Barriers to discharge        Co-evaluation PT/OT/SLP Co-Evaluation/Treatment: Yes Reason for Co-Treatment: Necessary to address cognition/behavior during functional activity;To address functional/ADL transfers;For patient/therapist safety PT goals addressed during session: Mobility/safety with mobility OT goals addressed during session: Strengthening/ROM       AM-PAC PT "6 Clicks" Mobility  Outcome Measure Help needed turning from your back to your side while in a flat bed without  using bedrails?: Total Help needed moving from lying on your back to sitting on the side of a flat bed without using bedrails?: Total Help needed moving to and from a bed to a chair (including a wheelchair)?: Total Help needed standing up from a chair using your arms (e.g., wheelchair or bedside chair)?: Total Help needed to walk in hospital room?: Total Help needed climbing 3-5 steps with a railing? : Total 6 Click Score: 6    End of Session Equipment Utilized During Treatment: Oxygen Activity Tolerance: Patient limited by lethargy Patient left: in bed;with call bell/phone within reach;with bed alarm  set;with restraints reapplied Nurse Communication: Mobility status;Other (comment) (lethargy; V-Fib, per monitor) PT Visit Diagnosis: Muscle weakness (generalized) (M62.81);Difficulty in walking, not elsewhere classified (R26.2);Other symptoms and signs involving the nervous system (R29.898)    Time: 4782-9562 PT Time Calculation (min) (ACUTE ONLY): 33 min   Charges:   PT Evaluation $PT Eval Moderate Complexity: 1 Mod          Raymond Gurney, PT, DPT Acute Rehabilitation Services  Pager: [phone] Office: [phone]   Jewel Baize 03/14/2021, 2:17 PM

## 2021-03-14 NOTE — Progress Notes (Signed)
  Echocardiogram 2D Echocardiogram has been performed.  Casey Branch 03/14/2021, 3:29 PM

## 2021-03-14 NOTE — Progress Notes (Signed)
SLP Cancellation Note  Patient Details Name: Kiondre Grenz MRN: [id number] DOB: 1981-06-05   Cancelled treatment:        Pt on non rebreather and Dr. Katrinka Blazing has ordered Cortrak. Pt not ready for swallow assessment presently however will continue efforts tomorrow.    Royce Macadamia 03/14/2021, 9:13 AM  Breck Coons Lonell Face.Ed Nurse, children's [phone] Office [phone]

## 2021-03-14 NOTE — Procedures (Signed)
Cortrak  Person Inserting Tube:  Samanda Buske C, RD Tube Type:  Cortrak - 43 inches Tube Size:  10 Tube Location:  Right nare Initial Placement:  Stomach Secured by: Bridle Technique Used to Measure Tube Placement:  Marking at nare/corner of mouth Cortrak Secured At:  69 cm  Cortrak Tube Team Note:  Consult received to place a Cortrak feeding tube.   X-ray is required, abdominal x-ray has been ordered by the Cortrak team. Please confirm tube placement before using the Cortrak tube.   If the tube becomes dislodged please keep the tube and contact the Cortrak team at [email or web address] (password [email or web address]) for replacement.  If after hours and replacement cannot be delayed, place a NG tube and confirm placement with an abdominal x-ray.    Mayrani Khamis P., RD, LDN, CNSC See AMiON for contact information    

## 2021-03-14 NOTE — Progress Notes (Signed)
   Providing Compassionate, Quality Care - Together  NEUROSURGERY PROGRESS NOTE   S: high fevers, extubated yesterday  O: EXAM:  BP 135/73   Pulse 60   Temp (!) 102.8 F (39.3 C) (Axillary) Comment: see eMAR and packed in ice  Resp (!) 25   Ht 5\' 10"  (1.778 m)   Wt 82.9 kg   SpO2 94%   BMI 26.22 kg/m   Awake, alert, noncommunicative Tracks around room Slight disconjugate gaze Spont moves L arm/leg WD RLE No motor response RUE Does not follow commands  ASSESSMENT:  40 y.o. male with   L thalamic AVM, grade 4, presented with hemorrhage/hydrocephalus  -s/p EVD removed 6/23  PLAN: - fever workup - sbp control - sz prophylaxis - cont supportive care    Thank you for allowing me to participate in this patient's care.  Please do not hesitate to call with questions or concerns.   7/23, DO Neurosurgeon [facility] Cell: [phone]

## 2021-03-14 NOTE — Evaluation (Signed)
Occupational Therapy Evaluation Patient Details Name: Casey Branch MRN: [id number] DOB: 16-Jun-1981 Today's Date: 03/14/2021    History of Present Illness Pt is a 40 y.o. male who presented 6/18 with a headache, confusion, then LOC with nausea and vomiting. 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 S/p R frontal ventriculostomy 6/18. Developed hydrocephalus s/p R F EVD 6/19. ETT 6/19 - 6/23. S/p diagnostic cerebral angiogram 6/21. EVD removed 6/23. Pt found to be COVID + on admission. + Rt UE DVT 6/24; ectopy and PVC 6/24 PMH: h/o prior intracranial hemorrhage with VPS.   Clinical Impression   Pt admitted with above. He demonstrates the below listed deficits and will benefit from continued OT to maximize safety and independence with BADLs.  Pt presents to OT with decreased arousal and dependencies with all ADLs and functional mobility.  He currently requires total A for all aspects of ADLs and functional mobility.  PTA, he was living with his wife, and per chart, was independent with ADLs.  Feel he will likely benefit from CIR once lethargy and arousal improves.      Follow Up Recommendations  CIR    Equipment Recommendations  None recommended by OT    Recommendations for Other Services Rehab consult     Precautions / Restrictions Precautions Precautions: Fall Precaution Comments: periods of agitation requiring restraints and haldol      Mobility Bed Mobility Overal bed mobility: Needs Assistance Bed Mobility: Supine to Sit;Sit to Supine     Supine to sit: Total assist;+2 for physical assistance;+2 for safety/equipment;HOB elevated Sit to supine: Total assist;+2 for physical assistance;+2 for safety/equipment;HOB elevated   General bed mobility comments: required assist for all aspects - pt 0%    Transfers Overall transfer level: Needs assistance               General transfer comment: unable to attempt due to decreased responsiveness    Balance  Overall balance assessment: Needs assistance Sitting-balance support: No upper extremity supported;Feet supported Sitting balance-Leahy Scale: Zero Sitting balance - Comments: required total A to maintain EOB sitting - pt 0%       Standing balance comment: unable to attempt                           ADL either performed or assessed with clinical judgement   ADL Overall ADL's : Needs assistance/impaired Eating/Feeding: NPO                                     General ADL Comments: Pt required total A - unable to engage in any ADL task     Vision   Additional Comments: Pt opens eyes minimally to noxious stimuli, but does not track or fixate     Perception Perception Perception Tested?: No   Praxis Praxis Praxis tested?: Not tested    Pertinent Vitals/Pain Pain Assessment: 0-10 Pain Score: 0-No pain     Hand Dominance  (unknown)   Extremity/Trunk Assessment Upper Extremity Assessment Upper Extremity Assessment: RUE deficits/detail;LUE deficits/detail RUE Deficits / Details: edema noted.  Pt diagnosed with DVT today - unable to be anticoagulated per RN.  ROM not assessed.  Noted minimal activel movement prior to therapist's entrance into room RUE Coordination: decreased gross motor;decreased fine motor LUE Deficits / Details: Pt noted with spontaneous movement prior to therapist's entrance, but did not actively  move Lt UE during eval. PROM grossly WFL   Lower Extremity Assessment Lower Extremity Assessment: Defer to PT evaluation       Communication Communication Communication: Other (comment) (Pt non verbal and followed no commands)   Cognition Arousal/Alertness: Lethargic Behavior During Therapy: Flat affect Overall Cognitive Status: Impaired/Different from baseline                                 General Comments: Pt lethargic with very minimal eye opening and grimacing to noxious stimuli.  RN reports pt had increasing  restlessness and recently had Haldol.  Just prior to therapy entrance, pt was restless and moving spontaneously in bed, but responded very minimally to therapists' stimuli during eval   General Comments  Pt maintained Sp02 94% on 10L hiflo 02 during session.  HR 42-68 with ? V-Fib, per monitor - RN aware. Mild drainage from scalp incision - RN aware    Exercises     Shoulder Instructions      Home Living Family/patient expects to be discharged to:: Unsure                                 Additional Comments: Pt unable to provide info and no family present      Prior Functioning/Environment          Comments: per chart, pt appears to have been independent, but pt unable to provide info and no family present        OT Problem List: Decreased strength;Decreased range of motion;Decreased activity tolerance;Impaired balance (sitting and/or standing);Impaired vision/perception;Decreased coordination;Decreased cognition;Decreased safety awareness;Decreased knowledge of use of DME or AE;Decreased knowledge of precautions;Cardiopulmonary status limiting activity;Impaired UE functional use      OT Treatment/Interventions: Self-care/ADL training;Neuromuscular education;DME and/or AE instruction;Manual therapy;Splinting;Therapeutic activities;Cognitive remediation/compensation;Visual/perceptual remediation/compensation;Patient/family education;Balance training    OT Goals(Current goals can be found in the care plan section) Acute Rehab OT Goals OT Goal Formulation: Patient unable to participate in goal setting Time For Goal Achievement: 03/28/21 Potential to Achieve Goals: Good ADL Goals Pt Will Perform Grooming: (P) with mod assist;sitting Pt Will Perform Upper Body Bathing: (P) with max assist;sitting Additional ADL Goal #1: (P) Pt will attend to simple self care task x 5 mins with min cues Additional ADL Goal #2: (P) Will follow simple one step commands at least 40% of the  time with min prompting Additional ADL Goal #3: (P) Will maintain EOB sitting with mod A x 5 mins in prep for ADLs  OT Frequency: Min 2X/week   Barriers to D/C:            Co-evaluation PT/OT/SLP Co-Evaluation/Treatment: Yes Reason for Co-Treatment: Necessary to address cognition/behavior during functional activity;For patient/therapist safety;To address functional/ADL transfers   OT goals addressed during session: Strengthening/ROM      AM-PAC OT "6 Clicks" Daily Activity     Outcome Measure Help from another person eating meals?: Total Help from another person taking care of personal grooming?: Total Help from another person toileting, which includes using toliet, bedpan, or urinal?: Total Help from another person bathing (including washing, rinsing, drying)?: Total Help from another person to put on and taking off regular upper body clothing?: Total Help from another person to put on and taking off regular lower body clothing?: Total 6 Click Score: 6   End of Session Equipment Utilized During Treatment: Oxygen Nurse Communication:  Mobility status  Activity Tolerance: Patient limited by lethargy Patient left: in bed;with bed alarm set;with restraints reapplied  OT Visit Diagnosis: Cognitive communication deficit (R41.841) Symptoms and signs involving cognitive functions: Nontraumatic intracerebral hemorrhage                Time: 1308-6578 OT Time Calculation (min): 33 min Charges:  OT General Charges $OT Visit: 1 Visit OT Evaluation $OT Eval Moderate Complexity: 1 Mod  Damyra Luscher C., OTR/L Acute Rehabilitation Services Pager [phone] Office [phone]   Jeani Hawking M 03/14/2021, 1:00 PM

## 2021-03-14 NOTE — Progress Notes (Signed)
Nutrition Follow-up  DOCUMENTATION CODES:  Not applicable  INTERVENTION:  Once Cortrak is placed, resume TF via Cortrak: Vital 1.5 at 55 ml/hr Pro-Source TF 45 mL BID  Free water flushes per CCM.  NUTRITION DIAGNOSIS:  Inadequate oral intake related to acute illness as evidenced by NPO status. - ongoing  GOAL:  Patient will meet greater than or equal to 90% of their needs - met via TF  MONITOR:  Vent status, TF tolerance, Weight trends, Labs  REASON FOR ASSESSMENT:  Ventilator, Consult Enteral/tube feeding initiation and management  ASSESSMENT:  40 yo male with h/o prior ICH with VPS who presents with confusion followed by LOC with N/V and admitted with likely recurrent AVM hemorrhage-VPS likely non-functional. Pt developed respiratory failure due to encephalopathy from brain bleed requiring intubation. COVID-19+-incidental finding. 6/18 - Presented to [facility] ED via EMS with AMS/R-sided hemiplegia/L gaze preference 6/19 - PCCM consulted for ventilator management post-intubation 6/20 - Increased agitation on vent, requiring increased sedation. Opens eyes, biting ETT, not following commands. Ongoing R-sided neglect 6/21 - Improved agitation overnight, appears more comfortable. 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 Clamping EVD. Switch precedex for propofol. 6/23 - EVD removed; extubated  6/24 - plan for Cortrak placement  Current TF regimen (currently holding for Cortrak placement): Tube Feeding via OG: Vital 1.5 at 55 ml/hr Pro-Source TF 45 mL BID Provides 2060 kcals, 111 g of protein, and 1003 mL of free water  Admit wt: 76.2 kg Current wt: 82.9 kg  Medications: reviewed; SSI, Senokot syrup BID per tube, cefepime TID via IV, Precedex, Vancomycin TID via IV, Haldol PRN (given once today), Versed PRN (given once today)  Labs: reviewed; K 3.1 (L)  Diet Order:    Diet Order             Diet NPO time specified  Diet effective now                  EDUCATION NEEDS:  Not appropriate for education at this time  Skin:  Skin Assessment: Reviewed RN Assessment  Last BM:  03/13/21 - Type 6, medium  Height:  Ht Readings from Last 1 Encounters:  03/13/21 _0  (1.778 m)   Weight:  Wt Readings from Last 1 Encounters:  03/12/21 82.9 kg   BMI:  Body mass index is 26.22 kg/m.  Estimated Nutritional Needs:  Kcal:  1900-2200 kcals Protein:  105-130 g Fluid:  >/= 2 L  Derrel Nip, RD, LDN (she/her/hers) Registered Dietitian I After-Hours/Weekend Pager # in [place]

## 2021-03-14 NOTE — Progress Notes (Signed)
Pharmacy Antibiotic Note  Casey Branch is a 40 y.o. male admitted on 03/08/2021 with ICH due to AVM. Patient de-escalated to cefazolin on 6/23 for TA with group c strept and MSSA then re-broadened to vancomycin (AUC dosing) and cefepime due to temperature 101F later that day. EVD was also removed yesterday. Discussed with Dr. Katrinka Blazing and will dose vancomycin by nomogram for CNS penetration with recent EVD removal. No Scr today due to agitation preventing lab collection. UOP good. Tmax 102.8.    Plan: Continue cefepime 2g q8h  Adjust vancomycin to 1000mg  q8h based on nomogram dosing for CNS penetration Vancomycin levels as indicated  Monitor renal function, cultures, and clinical progression    Height: 5\' 10"  (177.8 cm) Weight: 82.9 kg (182 lb 12.2 oz) IBW/kg (Calculated) : 73  Temp (24hrs), Avg:100.9 F (38.3 C), Min:99.4 F (37.4 C), Max:102.8 F (39.3 C)  Recent Labs  Lab 03/08/21 1212 03/08/21 1213 03/09/21 0736 03/10/21 0444 03/11/21 0556 03/12/21 0544 03/13/21 0254  WBC 5.5  --   --   --  12.2*  --  12.1*  CREATININE 1.03   < > 1.03 0.92 0.90 0.91 1.02   < > = values in this interval not displayed.     Estimated Creatinine Clearance: 99.4 mL/min (by C-G formula based on SCr of 1.02 mg/dL).    No Known Allergies  Antimicrobials this admission: 6/20 unasyn>>6/23 6/23 cefazolin>>6/23 6/23 vanc >> 6/23 cefepime >> 6/20 remdesivir>>6/24  Dose adjustments this admission: 6/24: AUC dosing of vancomycin changed to nomogram dosing  Microbiology results: 6/20 TA: group C strep, MSSA 6/23 bcx:   7/20, PharmD Clinical Pharmacist  03/14/2021 8:59 AM

## 2021-03-14 NOTE — Progress Notes (Signed)
Upper extremity venous RT study completed.  Preliminary results relayed to Victorino Dike, RN.  See CV Proc for preliminary results report.   Jean Rosenthal, RDMS, RVT

## 2021-03-14 NOTE — Progress Notes (Signed)
Inpatient Rehab Admissions Coordinator Note:   Per PT recommendations, pt was screened for CIR candidacy by Estill Dooms, PT, DPT.  At this time pt not able to participate or follow commands.  Will re-screen pt on Monday 6/27 for progress and possibly candidacy.  Please contact me with questions.   Estill Dooms, PT, DPT [phone] 03/14/21 4:01 PM

## 2021-03-15 LAB — CBC
HCT: 36.5 % — ABNORMAL LOW (ref 39.0–52.0)
Hemoglobin: 12.4 g/dL — ABNORMAL LOW (ref 13.0–17.0)
MCH: 30.1 pg (ref 26.0–34.0)
MCHC: 34 g/dL (ref 30.0–36.0)
MCV: 88.6 fL (ref 80.0–100.0)
Platelets: 234 10*3/uL (ref 150–400)
RBC: 4.12 MIL/uL — ABNORMAL LOW (ref 4.22–5.81)
RDW: 13 % (ref 11.5–15.5)
WBC: 13 10*3/uL — ABNORMAL HIGH (ref 4.0–10.5)
nRBC: 0.2 % (ref 0.0–0.2)

## 2021-03-15 LAB — BASIC METABOLIC PANEL
Anion gap: 10 (ref 5–15)
BUN: 26 mg/dL — ABNORMAL HIGH (ref 6–20)
CO2: 23 mmol/L (ref 22–32)
Calcium: 8.2 mg/dL — ABNORMAL LOW (ref 8.9–10.3)
Chloride: 103 mmol/L (ref 98–111)
Creatinine, Ser: 1.08 mg/dL (ref 0.61–1.24)
GFR, Estimated: >60 mL/min (ref >60)
Glucose, Bld: 150 mg/dL — ABNORMAL HIGH (ref 70–99)
Potassium: 3 mmol/L — ABNORMAL LOW (ref 3.5–5.1)
Sodium: 136 mmol/L (ref 135–145)

## 2021-03-15 LAB — MAGNESIUM: Magnesium: 2 mg/dL (ref 1.7–2.4)

## 2021-03-15 LAB — PROCALCITONIN: Procalcitonin: 0.19 ng/mL

## 2021-03-15 LAB — GLUCOSE, CAPILLARY
Glucose-Capillary: 116 mg/dL — ABNORMAL HIGH (ref 70–99)
Glucose-Capillary: 118 mg/dL — ABNORMAL HIGH (ref 70–99)
Glucose-Capillary: 119 mg/dL — ABNORMAL HIGH (ref 70–99)
Glucose-Capillary: 150 mg/dL — ABNORMAL HIGH (ref 70–99)

## 2021-03-15 LAB — MRSA NEXT GEN BY PCR, NASAL: MRSA by PCR Next Gen: NOT DETECTED

## 2021-03-15 MED ORDER — MAGNESIUM SULFATE 2 GM/50ML IV SOLN: 2.0000 g | Freq: Once | INTRAVENOUS | Status: DC

## 2021-03-15 MED ORDER — POTASSIUM CHLORIDE 20 MEQ PO PACK
40.0000 meq | PACK | Freq: Two times a day (BID) | ORAL | Status: AC
  Administered 2021-03-15 (×2): 40 meq
  Filled 2021-03-15 (×2): qty 2

## 2021-03-15 MED ORDER — ACETAMINOPHEN 500 MG PO TABS
500.0000 mg | ORAL_TABLET | Freq: Four times a day (QID) | ORAL | Status: DC | PRN
  Administered 2021-03-15 – 2021-03-16 (×4): 500 mg
  Filled 2021-03-15 (×4): qty 1

## 2021-03-15 MED ORDER — CHLORHEXIDINE GLUCONATE 0.12 % MT SOLN
15.0000 mL | Freq: Two times a day (BID) | OROMUCOSAL | Status: DC
  Administered 2021-03-15 – 2021-03-18 (×6): 15 mL via OROMUCOSAL
  Filled 2021-03-15 (×5): qty 15

## 2021-03-15 MED ORDER — ACETAMINOPHEN 500 MG PO TABS: 500.0000 mg | ORAL_TABLET | Freq: Four times a day (QID) | ORAL | Status: DC | PRN

## 2021-03-15 MED ORDER — ORAL CARE MOUTH RINSE
15.0000 mL | Freq: Two times a day (BID) | OROMUCOSAL | Status: DC
  Administered 2021-03-16 – 2021-03-18 (×4): 15 mL via OROMUCOSAL

## 2021-03-15 NOTE — Progress Notes (Signed)
eLink Physician-Brief Progress Note Patient Name: Casey Branch DOB: [birth year] MRN: [id number]   Date of Service  03/15/2021  HPI/Events of Note  Py having low grade temps 101 and tylenol order is rectally. Pt now has feeding tube and would like order changed to per tube. Last Blood cultures 6/23, sputum 6/20  eICU Interventions  Already on vanc/cefepime.  - Tylenol changed to NG tube. Follow cultures. Discussed with RN.      Intervention Category Intermediate Interventions: Other:  Ranee Gosselin 03/15/2021, 10:25 PM

## 2021-03-15 NOTE — Progress Notes (Signed)
NAME:  Eastin Swing MRN:  [id number] DOB:  Mar 21, 1981 LOS: 7 ADMISSION DATE:  03/08/2021 CONSULTATION DATE:  03/09/2021 REFERRING MD:  Maurice Small - NSGY CHIEF COMPLAINT:  ICH, ventilator management.   History of Present Illness:  40 year old man with PMHx significant for prior ICH (?secondary to brain aneurysm vs. AVM) s/p VP shunt placement who presented to [facility] ED with AMS and unilateral R-sided weakness after a syncopal event at home (heard by wife, found patient on floor). Code Stroke was initiated on arrival to ED.  Per chart review, on EMS arrival patient was altered with L gaze deviation and R hemiplegia. BP 130/84 and CBG 107. Vomited x 2, once at home and once on arrival to ED. Initial CT Head demonstrated subarachnoid blood in the posterior fossa with ventricular extension and early ventricular dilation and VP shunt noted (present admission). Patient was subsequently intubated in ED for airway protection, given AMS and neurologic exam. Admitted by NSGY; R frontal EVD placed and HTS 3% initiated. Repeat CT showed L thalamic hemorrhage with intraventricular extension with mass effect and R frontal EVD in place.  PCCM consulted for ventilator management. Incidentally COVID+.  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 Opens eyes, biting ETT, not following commands. Ongoing R-sided neglect. Remdesivir + Dexamethasone started for COVID. TA sent, Unasyn for aspiration PNA coverage. 6/21 - Improved agitation overnight, appears more comfortable. WBC/glucoses bumped, likely in the setting of steroids. MRI Angiogram today. 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 Clamping EVD. Switch precedex for propofol. 6/23 EVD removed, extubated; HTS stopped 6/24 low dose precedex, tmax 102, abx expanded, superficial ulnar dvt in RUE, neg LE DVTs  6/20 unasyn >>6/23 6/23 cefazolin 6/23 cefepime >> 6/23 vanc >>  6/20 remdesivir> 6/24  6/18 SARS >> positive  6/18 MRSA pcr > neg 6/20 trach asp >> group c strep, MSSA 6/23 Bcx2 >> ngtd  Interim History / Subjective:  Off precedex this am doing well  Awake, oriented x 2 Down to  2L  Having drainage from prior EVD site Tmax 99.8  Objective:  Blood pressure (!) 120/59, pulse 74, temperature (!) 100.5 F (38.1 C), temperature source Oral, resp. rate (!) 33, height 5\' 10"  (1.778 m), weight 82.9 kg, SpO2 93 %.        Intake/Output Summary (Last 24 hours) at 03/15/2021 03/17/2021 Last data filed at 03/15/2021 0800 Gross per 24 hour  Intake 1840.15 ml  Output 1200 ml  Net 640.15 ml   Filed Weights   03/08/21 1200 03/11/21 0446 03/12/21 0422  Weight: 76.2 kg 81.6 kg 82.9 kg   Physical Examination: General:  Adult male sitting upright in bed in NAD HEENT: MM pink/moist, thick layer of dried secretions from tongue/ mouth removed, pupils 3/reactive, left gaze preference, cortrak, some tan/ brownish drainage at prior EVD site Neuro: Awake, soft verbalization of name and "at hospital", f/c in all extremities except RLE, LUE 3-4/5, LLE 4/4, RUE able to squeeze hand/ thumbs up, withdrawals vs triple flex in RLE CV: NSR, no murmur PULM:  non labored, clear anteriorly, sensitive gag reflex GI: soft, bs+, NT,  condom cath Extremities: warm/dry, no LE edema, slight dependent edema RUE Skin: no rashes   Labs/imaging that I have personally reviewed: (right click and "Reselect all SmartList Selections" daily)  Pending labs 6/25  Micro data reviewed 6/24 RUE with DVT of right ulnar vein/ superficial right  cephalic vein, indeterminate DVT right axillary vein 6/24 LE DVT neg  Resolved Hospital Problem List:     Assessment & Plan:  L thalamic ICH 2/2 L thalamic AVM with acute hydrocephalus -- s/p R frontal EVD  Prior ICH in same location, s/p VP Shunt  Iatrogenic hypernatremia Acute encephalopathy in setting of ICH, delirium superimposed on question developing sepsis with fevers - NSGY primary - will defer drainage to NSGY - continue vanc/ cefepime for now  - improved neuro exam today, f/c/ awake - off precedex, prn as needed/ prn haldol - steroids stopped 6/23 - delirium precautions - trending neuro exams - trend BMET for Na  - will need outpt SRS for AVM  Acute hypoxic respiratory failure requiring MV due to encephalopathy- extubated 6/23 COVID-19 positivity suspicion for incidental RLL aspiration PNA - strep group C and staph in sputum - Airborne/contact precautions until 6/26.  - down to [place] 2L today, no respiratory distress, looks much better -  supplemental O2 for sat goal > 90% - pulmonary hygine as able - hopeful for OOB/ PT/ OT/ SLP  - aspiration precautions   Fevers- abx broadened 6/23 - continue Vanc/cefepime for now - PNA seems to be improving  - ? Fevers related to central vs precedex vs DVT vs now drainage out of prior EVD site, today off precedex - leukocytosis ?exacerbated by steroids which were stopped 6/23 - follow cultures   Dysphagia-  - continue cortrak - SLP   Ectopy- seems resolved, trend electrolytes  R arm swelling- DVT confirmed on duplex 6/24; ?cause of fever - Heparin DVT ppx for now, will need to check with NSGY if ok with heparin gtt - TTE 6/24 with reassuring RV, normal and given improving O2 needs, less suspicious for PE   Hypokalemia Hypomag - pending BMET/ mag - replete as needed  Best practice:  Diet: NPO, TF per cortrak Pain/Anxiety/Delirium protocol (if indicated): haldol/ precedex prn  VAP protocol (if indicated): N/A DVT  prophylaxis: heparin SQ GI prophylaxis:  Glucose control: d/c SSI, within goal, trend on BMET Mobility: progress as able Code Status: full Family Communication: per primary Disposition: ICU for now, nearing readiness for transfer out       Posey Boyer, ACNP [facility] 03/15/2021, 9:28 AM

## 2021-03-15 NOTE — Evaluation (Signed)
Clinical/Bedside Swallow Evaluation Patient Details  Name: Casey Branch MRN: [id number] Date of Birth: 1981-03-11  Today's Date: 03/15/2021 Time: SLP Start Time (ACUTE ONLY): 0930 SLP Stop Time (ACUTE ONLY): 0952 SLP Time Calculation (min) (ACUTE ONLY): 22 min  Past Medical History:  Past Medical History:  Diagnosis Date   Brain aneurysm    Past Surgical History:  Past Surgical History:  Procedure Laterality Date   IR 3D INDEPENDENT WKST  03/11/2021   IR ANGIO INTRA EXTRACRAN SEL COM CAROTID INNOMINATE BILAT MOD SED  03/11/2021   IR ANGIO VERTEBRAL SEL VERTEBRAL BILAT MOD SED  03/11/2021   RADIOLOGY WITH ANESTHESIA N/A 03/11/2021   Procedure: IR WITH ANESTHESIA - DIAGNOSTIC CEREBRAL ANGIOGRAM;  Surgeon: Lisbeth Renshaw, MD;  Location: [facility] OR;  Service: Radiology;  Laterality: N/A;   VENTRICULOPERITONEAL SHUNT     HPI:  Pt is a 40 yo male who presented to [facility] ED with AMS and unilateral R-sided weakness after a syncopal event at home (heard by wife, found patient on floor). Code Stroke was initiated on arrival to ED.  Pt was was found to have large L IVH on CT 6/18.  Pt intubated 6/18-24.  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 CXR 6/23 concerning for RLL infiltrate.  Cortrak placed 6/24.   Assessment / Plan / Recommendation Clinical Impression  Pt presents with oral dysphagia c/b decreased labial seal, reduced lingual strength and coordination, and suspected diminished oral sensation.  These deficits resulted in anterior loss of solids, incomplete and inefficent mastication, and severe oral residue.  Pt did not attempt to contain bolus or perform lingual sweep to recapture lost bolus material remaining on lips.  Majority of solid bolus was removed from oral cavity by  SLP.  There was no oral loss of puree or liquids by cup or straw.  Oral clearance of puree was seemingly adequate, but it was difficult to visualize oral cavity.  SLP  provided oral care prior to administration of bolus trial there was a mild amount of thick dried secretions in oral cavity.  RN reports aggressive, thorough oral care this AM and continunuing.  Pt declined to participate in OME tasks.  Pt did not exhibite clinical s/s of pharyngeal dysphagia with any consistencies; however, a concern for prandial aspiration remains given prolonged intubation and large IVH.  Recommend instrumental assessment prior to initiation of PO diet.  Pt has Cortrak in place at this time.  Will plan on MBSS Monday 2/2 radiology availbility.    Recommend pt remain NPO with alternate means of nutrition, hydration, and medication.  Pt may have ice chips and small sips of unthickened water only for comfort in moderation, after good oral care, when fully awake/alert, with upright positioning and direct supervision.  Pt was lethargic and had declined to follow directions during swallowing assessment.  Speech-Language/Cognitive evaluation deferred until pt's alertness has improved and he is able to participate.   SLP Visit Diagnosis: Dysphagia, oral phase (R13.11);Dysphagia, unspecified (R13.10)    Aspiration Risk  Moderate aspiration risk    Diet Recommendation NPO   Medication Administration: Via alternative means    Other  Recommendations     Follow up Recommendations  (TBD)      Frequency and Duration  (TBD)          Prognosis Prognosis for Safe Diet Advancement:  (TBD)      Swallow Study   General Date of Onset: 03/08/21 HPI: Pt is a 39  yo male who presented to [facility] ED with AMS and unilateral R-sided weakness after a syncopal event at home (heard by wife, found patient on floor). Code Stroke was initiated on arrival to ED.  Pt was was found to have large L IVH on CT 6/18.  Pt intubated 6/18-24.  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 CXR 6/23 concerning for RLL infiltrate.  Cortrak placed 6/24. Type of Study:  Bedside Swallow Evaluation Previous Swallow Assessment: none Diet Prior to this Study: NPO Temperature Spikes Noted: No Respiratory Status: Nasal cannula History of Recent Intubation: Yes Length of Intubations (days): 6 days Date extubated: 03/14/21 Behavior/Cognition: Alert;Doesn't follow directions;Requires cueing Oral Cavity Assessment: Dried secretions Oral Care Completed by SLP: Yes Oral Cavity - Dentition: Adequate natural dentition;Missing dentition Self-Feeding Abilities: Needs assist Patient Positioning: Upright in bed Baseline Vocal Quality: Normal Volitional Cough: Cognitively unable to elicit Volitional Swallow: Unable to elicit    Oral/Motor/Sensory Function Overall Oral Motor/Sensory Function: Moderate impairment (Could not test) Facial ROM: Reduced right (suspected) Facial Symmetry: Abnormal symmetry right Facial Strength: Reduced right (suspected) Lingual ROM:  (could not assess) Lingual Symmetry:  (could not assess) Lingual Strength:  (could not assess) Velum:  (could not assess) Mandible:  (could not assess; possibly reduced excursion)   Ice Chips Ice chips: Within functional limits Presentation: Spoon   Thin Liquid Thin Liquid: Within functional limits Presentation: Cup;Straw    Nectar Thick Nectar Thick Liquid: Not tested   Honey Thick Honey Thick Liquid: Not tested   Puree Puree: Within functional limits Presentation: Spoon   Solid     Solid: Impaired Presentation:  (SLP fed) Oral Phase Impairments: Impaired mastication;Reduced labial seal Oral Phase Functional Implications: Right anterior spillage;Prolonged oral transit;Oral residue;Oral holding;Impaired mastication;Right lateral sulci pocketing      Kerrie Pleasure , MA, CCC-SLP Acute Rehabilitation Services Office: [phone]; Pager (6/25): [phone] 03/15/2021,10:33 AM

## 2021-03-15 NOTE — Progress Notes (Signed)
Subjective: NAEs o/n  Objective: Vital signs in last 24 hours: Temp:  [97.7 F (36.5 C)-100.5 F (38.1 C)] 100.5 F (38.1 C) (06/25 0800) Pulse Rate:  [41-87] 74 (06/25 1000) Resp:  [13-33] 32 (06/25 1000) BP: (107-142)/(59-87) 116/63 (06/25 1000) SpO2:  [92 %-100 %] 94 % (06/25 1000)  Intake/Output from previous day: 06/24 0701 - 06/25 0700 In: 1981.1 [I.V.:472.1; NG/GT:90; IV Piggyback:1418.1] Out: 1950 [Urine:1950] Intake/Output this shift: Total I/O In: 13.3 [I.V.:13.3] Out: -   Eyes open, fc x 4 but deconditioned.  Oriented to name.  Somewhat breathy with respirations. EVD area inspected.  The scalp is macerated with no obvious leakage and no pseudomeningocele. No gross purulence noted.  Lab Results: Recent Labs    03/13/21 0254 03/14/21 0934  WBC 12.1* 15.3*  HGB 11.6* 13.3  HCT 34.8* 37.6*  PLT 207 246   BMET Recent Labs    03/13/21 0254 03/13/21 1214 03/14/21 0933  NA 155*  155* 160* 143  K 3.7  --  3.1*  CL 127*  --  108  CO2 23  --  22  GLUCOSE 117*  --  78  BUN 20  --  15  CREATININE 1.02  --  1.02  CALCIUM 8.6*  --  9.0    Studies/Results: DG CHEST PORT 1 VIEW  Result Date: 03/13/2021 CLINICAL DATA:  Fevers EXAM: PORTABLE CHEST 1 VIEW COMPARISON:  Film from earlier in the same day. FINDINGS: Cardiac shadow is stable. Shunt catheter is again identified. Endotracheal tube and right jugular line have been removed. Lungs are well aerated bilaterally. Increasing central vascular congestion with interstitial edema is noted. Persistent right basilar infiltrate is seen. IMPRESSION: Stable right basilar infiltrate. Increasing vascular congestion and pulmonary edema. Electronically Signed   By: Alcide Clever M.D.   On: 03/13/2021 19:10   DG Abd Portable 1V  Result Date: 03/14/2021 CLINICAL DATA:  Tube placement EXAM: PORTABLE ABDOMEN - 1 VIEW COMPARISON:  Chest radiograph 03/13/2021, abdominal radiograph 03/12/2021 FINDINGS: Feeding tube tip appears to  have been repositioned now directed towards the gastric antrum. Shunt catheter tubing is again noted. Telemetry leads and external support devices overlie chest and abdomen. Persistent patchy consolidative opacity in the bilateral lower lobes. No high-grade obstructive bowel gas pattern is seen though this is not a complete view of the abdomen. No acute or worrisome osseous abnormalities. IMPRESSION: Repositioning of the feeding tube tip now directed towards the gastric antrum. Contiguous shunt catheter tubing noted. Bibasilar patchy opacities, could reflect airspace disease and/or edema. Electronically Signed   By: Kreg Shropshire M.D.   On: 03/14/2021 19:09   VAS [place] LOWER EXTREMITY VENOUS (DVT)  Result Date: 03/14/2021  Lower Venous DVT Study Patient Name:  VAISHNAV DEMARTIN  Date of Exam:   03/14/2021 Medical Rec #: [id number]     Accession #:    [id number] Date of Birth: 1981-01-20    Patient Gender: M Patient Age:   39Y Exam Location:  [facility] Procedure:      VAS [place] LOWER EXTREMITY VENOUS (DVT) Referring Phys: [id number] PAULA B SIMPSON --------------------------------------------------------------------------------  Indications: Upper extremity positive for DVT. Patient cannot be anticoagulated, eval for filter placement.  Limitations: Bandaging at RT CFV, unable to reposition. Comparison Study: No prior lower venous studies. Performing Technologist: Jean Rosenthal RDMS,RVT  Examination Guidelines: A complete evaluation includes B-mode imaging, spectral Doppler, color Doppler, and power Doppler as needed of all accessible portions of each vessel. Bilateral testing is considered an integral part of a  complete examination. Limited examinations for reoccurring indications may be performed as noted. The reflux portion of the exam is performed with the patient in reverse Trendelenburg.  +---------+---------------+---------+-----------+----------+--------------+ RIGHT     CompressibilityPhasicitySpontaneityPropertiesThrombus Aging +---------+---------------+---------+-----------+----------+--------------+ CFV      Full           Yes      Yes                                 +---------+---------------+---------+-----------+----------+--------------+ SFJ      Full                                                        +---------+---------------+---------+-----------+----------+--------------+ FV Prox  Full                                                        +---------+---------------+---------+-----------+----------+--------------+ FV Mid   Full                                                        +---------+---------------+---------+-----------+----------+--------------+ FV DistalFull                                                        +---------+---------------+---------+-----------+----------+--------------+ PFV      Full                                                        +---------+---------------+---------+-----------+----------+--------------+ POP      Full           Yes      Yes                                 +---------+---------------+---------+-----------+----------+--------------+ PTV      Full                                                        +---------+---------------+---------+-----------+----------+--------------+ PERO     Full                                                        +---------+---------------+---------+-----------+----------+--------------+   +---------+---------------+---------+-----------+----------+--------------+ LEFT     CompressibilityPhasicitySpontaneityPropertiesThrombus Aging +---------+---------------+---------+-----------+----------+--------------+ CFV      Full  Yes      Yes                                 +---------+---------------+---------+-----------+----------+--------------+ SFJ      Full                                                         +---------+---------------+---------+-----------+----------+--------------+ FV Prox  Full                                                        +---------+---------------+---------+-----------+----------+--------------+ FV Mid   Full                                                        +---------+---------------+---------+-----------+----------+--------------+ FV DistalFull                                                        +---------+---------------+---------+-----------+----------+--------------+ PFV      Full                                                        +---------+---------------+---------+-----------+----------+--------------+ POP      Full           Yes      Yes                                 +---------+---------------+---------+-----------+----------+--------------+ PTV      Full                                                        +---------+---------------+---------+-----------+----------+--------------+ PERO     Full                                                        +---------+---------------+---------+-----------+----------+--------------+     Summary: RIGHT: - There is no evidence of deep vein thrombosis in the lower extremity.  - No cystic structure found in the popliteal fossa.  LEFT: - There is no evidence of deep vein thrombosis in the lower extremity.  - No cystic structure found in the popliteal fossa.  *See table(s) above for measurements and observations. Electronically signed by Lemar Livings MD on 03/14/2021 at 2:42:32  PM.    Final    VAS [place] UPPER EXTREMITY VENOUS DUPLEX  Result Date: 03/14/2021 UPPER VENOUS STUDY  Patient Name:  MOSES ODOHERTY  Date of Exam:   03/14/2021 Medical Rec #: [id number]     Accession #:    [id number] Date of Birth: 11-13-1980    Patient Gender: M Patient Age:   39Y Exam Location:  [facility] Procedure:      VAS [place] UPPER EXTREMITY VENOUS DUPLEX Referring Phys: [id number]  PAULA B SIMPSON --------------------------------------------------------------------------------  Indications: Asymmetrical swelling RT arm Other Indications: Covid-19. Limitations: Patient unable/unwilling to reposition. Comparison Study: No prior studies. Performing Technologist: Jean Rosenthal RDMS,RVT  Examination Guidelines: A complete evaluation includes B-mode imaging, spectral Doppler, color Doppler, and power Doppler as needed of all accessible portions of each vessel. Bilateral testing is considered an integral part of a complete examination. Limited examinations for reoccurring indications may be performed as noted.  Right Findings: +----------+------------+---------+-----------+----------+-----------------+ RIGHT     CompressiblePhasicitySpontaneousProperties     Summary      +----------+------------+---------+-----------+----------+-----------------+ IJV           Full       Yes       Yes                                +----------+------------+---------+-----------+----------+-----------------+ Subclavian    Full       Yes       Yes                                +----------+------------+---------+-----------+----------+-----------------+ Axillary      None       No        No               Age Indeterminate +----------+------------+---------+-----------+----------+-----------------+ Brachial      Full       Yes       Yes                                +----------+------------+---------+-----------+----------+-----------------+ Radial        Full                                                    +----------+------------+---------+-----------+----------+-----------------+ Ulnar       Partial      Yes       Yes                    Acute       +----------+------------+---------+-----------+----------+-----------------+ Cephalic      None       No        No                     Acute        +----------+------------+---------+-----------+----------+-----------------+ Basilic       Full       Yes       Yes                                +----------+------------+---------+-----------+----------+-----------------+  Left Findings: +----------+------------+---------+-----------+----------+-------+ LEFT  CompressiblePhasicitySpontaneousPropertiesSummary +----------+------------+---------+-----------+----------+-------+ Subclavian               Yes       Yes                      +----------+------------+---------+-----------+----------+-------+  Summary:  Right: Findings consistent with acute deep vein thrombosis involving a single right ulnar vein at the proximal forearm. Findings consistent with acute superficial vein thrombosis involving the right cephalic vein. Findings consistent with age indeterminate deep vein thrombosis involving the right axillary vein.  *See table(s) above for measurements and observations.  Diagnosing physician: Lemar LivingsBrandon Cain MD Electronically signed by Lemar LivingsBrandon Cain MD on 03/14/2021 at 2:43:58 PM.    Final    ECHOCARDIOGRAM LIMITED  Result Date: 03/14/2021    ECHOCARDIOGRAM LIMITED REPORT   Patient Name:   Titus DubinLAMAR Jared Date of Exam: 03/14/2021 Medical Rec #:  [id number]    Height:       70.0 in Accession #:    [id number]   Weight:       182.8 lb Date of Birth:  10-08-1980   BSA:          2.009 m Patient Age:    39 years     BP:           125/73 mmHg Patient Gender: M            HR:           45 bpm. Exam Location:  Inpatient Procedure: 3D Echo, Limited Echo, Cardiac Doppler and Color Doppler Indications:    Stroke  History:        Patient has no prior history of Echocardiogram examinations.                 Stroke. Covid positive.  Sonographer:    Sheralyn Boatmanina West RDCS Referring Phys: [id number] Lorin GlassANIEL C SMITH  Sonographer Comments: Patient moving throughout echo. IMPRESSIONS  1. Limited study; full doppler not performed.  2. Left ventricular ejection fraction, by  estimation, is 50 to 55%. The left ventricle has low normal function. The left ventricle has no regional wall motion abnormalities. Left ventricular diastolic function could not be evaluated.  3. Right ventricular systolic function is normal. The right ventricular size is normal. There is normal pulmonary artery systolic pressure.  4. The mitral valve is normal in structure. No evidence of mitral valve regurgitation.  5. The aortic valve is tricuspid. Mild aortic valve sclerosis is present, with no evidence of aortic valve stenosis.  6. The inferior vena cava is normal in size with greater than 50% respiratory variability, suggesting right atrial pressure of 3 mmHg. FINDINGS  Left Ventricle: Left ventricular ejection fraction, by estimation, is 50 to 55%. The left ventricle has low normal function. The left ventricle has no regional wall motion abnormalities. The left ventricular internal cavity size was normal in size. There is no left ventricular hypertrophy. Left ventricular diastolic function could not be evaluated. Right Ventricle: The right ventricular size is normal. Right ventricular systolic function is normal. There is normal pulmonary artery systolic pressure. The tricuspid regurgitant velocity is 1.71 m/s, and with an assumed right atrial pressure of 3 mmHg,  the estimated right ventricular systolic pressure is 14.7 mmHg. Left Atrium: Left atrial size was normal in size. Right Atrium: Right atrial size was normal in size. Pericardium: Trivial pericardial effusion is present. Mitral Valve: The mitral valve is normal in structure. Tricuspid Valve: The tricuspid valve is normal in structure. Tricuspid valve  regurgitation is mild. Aortic Valve: The aortic valve is tricuspid. Mild aortic valve sclerosis is present, with no evidence of aortic valve stenosis. Pulmonic Valve: The pulmonic valve was normal in structure. Aorta: The aortic root is normal in size and structure. Venous: The inferior vena cava is  normal in size with greater than 50% respiratory variability, suggesting right atrial pressure of 3 mmHg. IAS/Shunts: No atrial level shunt detected by color flow Doppler. Additional Comments: Limited study; full doppler not performed. LEFT VENTRICLE PLAX 2D LVIDd:         4.70 cm LVIDs:         3.00 cm LV PW:         1.10 cm LV IVS:        0.90 cm  LV Volumes (MOD) LV vol d, MOD A2C: 85.8 ml LV vol d, MOD A4C: 109.0 ml LV vol s, MOD A2C: 26.5 ml LV vol s, MOD A4C: 40.6 ml LV SV MOD A2C:     59.3 ml LV SV MOD A4C:     109.0 ml LV SV MOD BP:      66.2 ml IVC IVC diam: 2.00 cm LEFT ATRIUM         Index      RIGHT ATRIUM           Index LA diam:    3.50 cm 1.74 cm/m RA Area:     14.00 cm                                RA Volume:   37.30 ml  18.57 ml/m   AORTA Ao Root diam: 3.20 cm TRICUSPID VALVE TR Peak grad:   11.7 mmHg TR Vmax:        171.00 cm/s Olga Millers MD Electronically signed by Olga Millers MD Signature Date/Time: 03/14/2021/3:49:02 PM    Final     Assessment/Plan: 40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

## 2021-03-15 NOTE — Progress Notes (Signed)
Spoke with the patient's sister Morrie Sheldon and gave an update. She expressed concern regarding the patient receiving Haldol during the night shift. She requested I page the doctor for an alternative. No haldol has been given since 0533 this morning. Selmer Dominion, NP notified. Haldol discontinued.

## 2021-03-16 LAB — RENAL FUNCTION PANEL
Albumin: 2.9 g/dL — ABNORMAL LOW (ref 3.5–5.0)
Anion gap: 11 (ref 5–15)
BUN: 21 mg/dL — ABNORMAL HIGH (ref 6–20)
CO2: 17 mmol/L — ABNORMAL LOW (ref 22–32)
Calcium: 7.9 mg/dL — ABNORMAL LOW (ref 8.9–10.3)
Chloride: 109 mmol/L (ref 98–111)
Creatinine, Ser: 0.89 mg/dL (ref 0.61–1.24)
GFR, Estimated: >60 mL/min (ref >60)
Glucose, Bld: 127 mg/dL — ABNORMAL HIGH (ref 70–99)
Phosphorus: 1.7 mg/dL — ABNORMAL LOW (ref 2.5–4.6)
Potassium: 3.8 mmol/L (ref 3.5–5.1)
Sodium: 137 mmol/L (ref 135–145)

## 2021-03-16 LAB — GLUCOSE, CAPILLARY
Glucose-Capillary: 119 mg/dL — ABNORMAL HIGH (ref 70–99)
Glucose-Capillary: 137 mg/dL — ABNORMAL HIGH (ref 70–99)
Glucose-Capillary: 129 mg/dL — ABNORMAL HIGH (ref 70–99)
Glucose-Capillary: 90 mg/dL (ref 70–99)
Glucose-Capillary: 124 mg/dL — ABNORMAL HIGH (ref 70–99)

## 2021-03-16 LAB — MAGNESIUM: Magnesium: 2.2 mg/dL (ref 1.7–2.4)

## 2021-03-16 NOTE — Progress Notes (Signed)
NAME:  Casey Branch MRN:  [id number] DOB:  Dec 27, 1980 LOS: 8 ADMISSION DATE:  03/08/2021 CONSULTATION DATE:  03/09/2021 REFERRING MD:  Maurice Small - NSGY CHIEF COMPLAINT:  ICH, ventilator management.   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 Code Stroke was initiated on arrival to ED.  Per chart review, on EMS arrival patient was altered with L gaze deviation and R hemiplegia. BP 130/84 and CBG 107. Vomited x 2, once at home and once on arrival to ED. Initial CT Head demonstrated subarachnoid blood in the posterior fossa with ventricular extension and early ventricular dilation and VP shunt noted (present admission). Patient was subsequently intubated in ED for airway protection, given AMS and neurologic exam. Admitted by NSGY; R frontal EVD placed and HTS 3% initiated. Repeat CT showed L thalamic hemorrhage with intraventricular extension with mass effect and R frontal EVD in place.  PCCM consulted for ventilator management. Incidentally COVID+.  Pertinent Medical History:  ICH (2/2 aneurysm, s/p VP shunt placement  Significant Hospital Events: Including procedures, antibiotic start and stop dates in addition to other pertinent events   6/18 - Presented to [facility] ED via EMS with AMS/R-sided hemiplegia/L gaze preference 6/19 - PCCM consulted for ventilator management post-intubation 6/20 - Increased agitation on vent, requiring increased sedation. Opens eyes, biting ETT, not following commands. Ongoing R-sided neglect. Remdesivir + Dexamethasone started for COVID. TA sent, Unasyn for aspiration PNA coverage. 6/21 - Improved agitation overnight, appears more comfortable. WBC/glucoses bumped, likely in the setting of steroids. MRI Angiogram today. 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 Clamping EVD. Switch precedex for propofol. 6/23 EVD removed, extubated; HTS stopped 6/24 low dose precedex, tmax 102, abx expanded, +dvt in RUE, neg LE DVTs 6/25 off precedex, following commands, oriented x 2, tmax 99.8, some drainage from EVD prior site, 2L [place], PCT 0.19, failed SLP  6/20 unasyn >>6/23 6/23 cefazolin 6/23 cefepime >> 6/26 6/23 vanc >> 6/26  6/20 remdesivir> 6/24  6/18 SARS >> positive  6/18 MRSA pcr > neg 6/20 trach asp >> group c strep, MSSA 6/23 Bcx2 >> ngtd 6/25 MRSA >> neg  Interim History / Subjective:  Tmax 101.7 Failed SLP 6/25, remains NPO On room air Off airborne precautions today- wife and sister at bedside  No complaints per patient other than wanting water/ ice chips  Objective:  Blood pressure 123/75, pulse 65, temperature (!) 101.7 F (38.7 C), temperature source Axillary, resp. rate (!) 26, height 5\' 10"  (1.778 m), weight 82.9 kg, SpO2 96 %.        Intake/Output Summary (Last 24 hours) at 03/16/2021 0849 Last data filed at 03/16/2021 0800 Gross per 24 hour  Intake 2603.29 ml  Output 950 ml  Net 1653.29 ml   Filed Weights   03/08/21 1200 03/11/21 0446 03/12/21 0422  Weight: 76.2 kg 81.6 kg 82.9 kg   Physical Examination: General:  Younger adult male sitting upright in bed in NAD HEENT: MM pink/moist, pupils 4/sluggish, left gaze preference, will not cross right for me, Cortrac, soft phonation, minimal drainage at prior R EVD site  Neuro: Awake, oriented to person, place, follows commands, RUE/ RLE 4/5, 3/5 LUE, 2-3/5 RLE CV: rr, no  murmur PULM:  non labored, CTA GI: soft, NT, +bs, condom cath Extremities: warm/dry, no LE edema  Skin: no rashes   Labs/imaging that I have personally reviewed: (right click and "Reselect all SmartList Selections" daily)  Pending labs 6/26  Micro data reviewed 6/24 RUE with DVT of right  ulnar vein/ superficial right cephalic vein, indeterminate DVT right axillary vein 6/24 LE DVT neg  Resolved Hospital Problem List:     Assessment & Plan:  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  - on room air, prn supplemental O2 for sat goal > 90% - aggressive pulmonary hygiene, push PT/ OOB efforts - aspiration precautions - remains NPO per SLP  Fevers- abx broadened 6/23 - stop abx, day 7 - PCT yest 0.19, fevers likely related to CNS/ reabsorption/ contributing RUE DVT - follow cultures   Dysphagia - continue cortrak - SLP   Ectopy- seems resolved, trend electrolytes  RUE DVT - Heparin DVT ppx for now, no further AC given ICH/ AVM per NSGY - lower ext DVT neg  Hypokalemia Hypomag - pending BMET this am  - mag 2.2  PCCM will continue to follow for medical needs while in ICU.  Possible transfer out of ICU, defer to primary/ NSGY  Best practice:  Diet: NPO, TF per cortrak Pain/Anxiety/Delirium protocol (if indicated): n/a VAP protocol (if indicated): N/A DVT prophylaxis: heparin SQ GI prophylaxis: n/a Glucose control:  trend on BMET Mobility: progress as able- PT/ OT Code Status: full Family Communication: per primary; wife and sister updated at bedside 6/26 Disposition: ICU for now, nearing readiness for transfer out       Posey Boyer, ACNP [facility] 03/16/2021, 8:49 AM

## 2021-03-16 NOTE — Progress Notes (Signed)
Subjective: Patient reports no headache.  Objective: Vital signs in last 24 hours: Temp:  [98.6 F (37 C)-101.7 F (38.7 C)] 101.7 F (38.7 C) (06/26 0800) Pulse Rate:  [61-99] 77 (06/26 0900) Resp:  [16-33] 22 (06/26 0900) BP: (113-136)/(63-86) 113/69 (06/26 0900) SpO2:  [93 %-98 %] 96 % (06/26 0900)  Intake/Output from previous day: 06/25 0701 - 06/26 0700 In: 2596.6 [I.V.:136.4; NG/GT:1670; IV Piggyback:790.2] Out: 950 [Urine:950] Intake/Output this shift: Total I/O In: 20 [I.V.:20] Out: -   Awake, alert, oriented to self Dysconjugate gaze Generalized deconditioning, but MAEs L>R Scalp clean, dry  Lab Results: Recent Labs    03/14/21 0934 03/15/21 1236  WBC 15.3* 13.0*  HGB 13.3 12.4*  HCT 37.6* 36.5*  PLT 246 234   BMET Recent Labs    03/14/21 0933 03/15/21 1236  NA 143 136  K 3.1* 3.0*  CL 108 103  CO2 22 23  GLUCOSE 78 150*  BUN 15 26*  CREATININE 1.02 1.08  CALCIUM 9.0 8.2*    Studies/Results: DG Abd Portable 1V  Result Date: 03/14/2021 CLINICAL DATA:  Tube placement EXAM: PORTABLE ABDOMEN - 1 VIEW COMPARISON:  Chest radiograph 03/13/2021, abdominal radiograph 03/12/2021 FINDINGS: Feeding tube tip appears to have been repositioned now directed towards the gastric antrum. Shunt catheter tubing is again noted. Telemetry leads and external support devices overlie chest and abdomen. Persistent patchy consolidative opacity in the bilateral lower lobes. No high-grade obstructive bowel gas pattern is seen though this is not a complete view of the abdomen. No acute or worrisome osseous abnormalities. IMPRESSION: Repositioning of the feeding tube tip now directed towards the gastric antrum. Contiguous shunt catheter tubing noted. Bibasilar patchy opacities, could reflect airspace disease and/or edema. Electronically Signed   By: Kreg Shropshire M.D.   On: 03/14/2021 19:09   VAS [place] LOWER EXTREMITY VENOUS (DVT)  Result Date: 03/14/2021  Lower Venous DVT Study  Patient Name:  Casey Branch  Date of Exam:   03/14/2021 Medical Rec #: [id number]     Accession #:    [id number] Date of Birth: May 16, 1981    Patient Gender: M Patient Age:   39Y Exam Location:  [facility] Procedure:      VAS [place] LOWER EXTREMITY VENOUS (DVT) Referring Phys: [id number] PAULA B SIMPSON --------------------------------------------------------------------------------  Indications: Upper extremity positive for DVT. Patient cannot be anticoagulated, eval for filter placement.  Limitations: Bandaging at RT CFV, unable to reposition. Comparison Study: No prior lower venous studies. Performing Technologist: Jean Rosenthal RDMS,RVT  Examination Guidelines: A complete evaluation includes B-mode imaging, spectral Doppler, color Doppler, and power Doppler as needed of all accessible portions of each vessel. Bilateral testing is considered an integral part of a complete examination. Limited examinations for reoccurring indications may be performed as noted. The reflux portion of the exam is performed with the patient in reverse Trendelenburg.  +---------+---------------+---------+-----------+----------+--------------+ RIGHT    CompressibilityPhasicitySpontaneityPropertiesThrombus Aging +---------+---------------+---------+-----------+----------+--------------+ CFV      Full           Yes      Yes                                 +---------+---------------+---------+-----------+----------+--------------+ SFJ      Full                                                        +---------+---------------+---------+-----------+----------+--------------+  FV Prox  Full                                                        +---------+---------------+---------+-----------+----------+--------------+ FV Mid   Full                                                        +---------+---------------+---------+-----------+----------+--------------+ FV DistalFull                                                         +---------+---------------+---------+-----------+----------+--------------+ PFV      Full                                                        +---------+---------------+---------+-----------+----------+--------------+ POP      Full           Yes      Yes                                 +---------+---------------+---------+-----------+----------+--------------+ PTV      Full                                                        +---------+---------------+---------+-----------+----------+--------------+ PERO     Full                                                        +---------+---------------+---------+-----------+----------+--------------+   +---------+---------------+---------+-----------+----------+--------------+ LEFT     CompressibilityPhasicitySpontaneityPropertiesThrombus Aging +---------+---------------+---------+-----------+----------+--------------+ CFV      Full           Yes      Yes                                 +---------+---------------+---------+-----------+----------+--------------+ SFJ      Full                                                        +---------+---------------+---------+-----------+----------+--------------+ FV Prox  Full                                                        +---------+---------------+---------+-----------+----------+--------------+  FV Mid   Full                                                        +---------+---------------+---------+-----------+----------+--------------+ FV DistalFull                                                        +---------+---------------+---------+-----------+----------+--------------+ PFV      Full                                                        +---------+---------------+---------+-----------+----------+--------------+ POP      Full           Yes      Yes                                  +---------+---------------+---------+-----------+----------+--------------+ PTV      Full                                                        +---------+---------------+---------+-----------+----------+--------------+ PERO     Full                                                        +---------+---------------+---------+-----------+----------+--------------+     Summary: RIGHT: - There is no evidence of deep vein thrombosis in the lower extremity.  - No cystic structure found in the popliteal fossa.  LEFT: - There is no evidence of deep vein thrombosis in the lower extremity.  - No cystic structure found in the popliteal fossa.  *See table(s) above for measurements and observations. Electronically signed by Lemar Livings MD on 03/14/2021 at 2:42:32 PM.    Final    VAS [place] UPPER EXTREMITY VENOUS DUPLEX  Result Date: 03/14/2021 UPPER VENOUS STUDY  Patient Name:  ROHN FRITSCH  Date of Exam:   03/14/2021 Medical Rec #: [id number]     Accession #:    [id number] Date of Birth: 05/06/1981    Patient Gender: M Patient Age:   39Y Exam Location:  [facility] Procedure:      VAS [place] UPPER EXTREMITY VENOUS DUPLEX Referring Phys: [id number] PAULA B SIMPSON --------------------------------------------------------------------------------  Indications: Asymmetrical swelling RT arm Other Indications: Covid-19. Limitations: Patient unable/unwilling to reposition. Comparison Study: No prior studies. Performing Technologist: Jean Rosenthal RDMS,RVT  Examination Guidelines: A complete evaluation includes B-mode imaging, spectral Doppler, color Doppler, and power Doppler as needed of all accessible portions of each vessel. Bilateral testing is considered an integral part of a complete examination. Limited examinations for reoccurring indications may be performed as noted.  Right Findings: +----------+------------+---------+-----------+----------+-----------------+ RIGHT  CompressiblePhasicitySpontaneousProperties     Summary      +----------+------------+---------+-----------+----------+-----------------+ IJV           Full       Yes       Yes                                +----------+------------+---------+-----------+----------+-----------------+ Subclavian    Full       Yes       Yes                                +----------+------------+---------+-----------+----------+-----------------+ Axillary      None       No        No               Age Indeterminate +----------+------------+---------+-----------+----------+-----------------+ Brachial      Full       Yes       Yes                                +----------+------------+---------+-----------+----------+-----------------+ Radial        Full                                                    +----------+------------+---------+-----------+----------+-----------------+ Ulnar       Partial      Yes       Yes                    Acute       +----------+------------+---------+-----------+----------+-----------------+ Cephalic      None       No        No                     Acute       +----------+------------+---------+-----------+----------+-----------------+ Basilic       Full       Yes       Yes                                +----------+------------+---------+-----------+----------+-----------------+  Left Findings: +----------+------------+---------+-----------+----------+-------+ LEFT      CompressiblePhasicitySpontaneousPropertiesSummary +----------+------------+---------+-----------+----------+-------+ Subclavian               Yes       Yes                      +----------+------------+---------+-----------+----------+-------+  Summary:  Right: Findings consistent with acute deep vein thrombosis involving a single right ulnar vein at the proximal forearm. Findings consistent with acute superficial vein thrombosis involving the right cephalic  vein. Findings consistent with age indeterminate deep vein thrombosis involving the right axillary vein.  *See table(s) above for measurements and observations.  Diagnosing physician: Lemar LivingsBrandon Cain MD Electronically signed by Lemar LivingsBrandon Cain MD on 03/14/2021 at 2:43:58 PM.    Final    ECHOCARDIOGRAM LIMITED  Result Date: 03/14/2021    ECHOCARDIOGRAM LIMITED REPORT   Patient Name:   Titus DubinLAMAR Wenker Date of Exam: 03/14/2021 Medical Rec #:  [id number]    Height:  70.0 in Accession #:    [id number]   Weight:       182.8 lb Date of Birth:  1980-10-29   BSA:          2.009 m Patient Age:    39 years     BP:           125/73 mmHg Patient Gender: M            HR:           45 bpm. Exam Location:  Inpatient Procedure: 3D Echo, Limited Echo, Cardiac Doppler and Color Doppler Indications:    Stroke  History:        Patient has no prior history of Echocardiogram examinations.                 Stroke. Covid positive.  Sonographer:    Sheralyn Boatman RDCS Referring Phys: [id number] Lorin Glass  Sonographer Comments: Patient moving throughout echo. IMPRESSIONS  1. Limited study; full doppler not performed.  2. Left ventricular ejection fraction, by estimation, is 50 to 55%. The left ventricle has low normal function. The left ventricle has no regional wall motion abnormalities. Left ventricular diastolic function could not be evaluated.  3. Right ventricular systolic function is normal. The right ventricular size is normal. There is normal pulmonary artery systolic pressure.  4. The mitral valve is normal in structure. No evidence of mitral valve regurgitation.  5. The aortic valve is tricuspid. Mild aortic valve sclerosis is present, with no evidence of aortic valve stenosis.  6. The inferior vena cava is normal in size with greater than 50% respiratory variability, suggesting right atrial pressure of 3 mmHg. FINDINGS  Left Ventricle: Left ventricular ejection fraction, by estimation, is 50 to 55%. The left ventricle has low normal  function. The left ventricle has no regional wall motion abnormalities. The left ventricular internal cavity size was normal in size. There is no left ventricular hypertrophy. Left ventricular diastolic function could not be evaluated. Right Ventricle: The right ventricular size is normal. Right ventricular systolic function is normal. There is normal pulmonary artery systolic pressure. The tricuspid regurgitant velocity is 1.71 m/s, and with an assumed right atrial pressure of 3 mmHg,  the estimated right ventricular systolic pressure is 14.7 mmHg. Left Atrium: Left atrial size was normal in size. Right Atrium: Right atrial size was normal in size. Pericardium: Trivial pericardial effusion is present. Mitral Valve: The mitral valve is normal in structure. Tricuspid Valve: The tricuspid valve is normal in structure. Tricuspid valve regurgitation is mild. Aortic Valve: The aortic valve is tricuspid. Mild aortic valve sclerosis is present, with no evidence of aortic valve stenosis. Pulmonic Valve: The pulmonic valve was normal in structure. Aorta: The aortic root is normal in size and structure. Venous: The inferior vena cava is normal in size with greater than 50% respiratory variability, suggesting right atrial pressure of 3 mmHg. IAS/Shunts: No atrial level shunt detected by color flow Doppler. Additional Comments: Limited study; full doppler not performed. LEFT VENTRICLE PLAX 2D LVIDd:         4.70 cm LVIDs:         3.00 cm LV PW:         1.10 cm LV IVS:        0.90 cm  LV Volumes (MOD) LV vol d, MOD A2C: 85.8 ml LV vol d, MOD A4C: 109.0 ml LV vol s, MOD A2C: 26.5 ml LV vol s, MOD A4C: 40.6 ml LV  SV MOD A2C:     59.3 ml LV SV MOD A4C:     109.0 ml LV SV MOD BP:      66.2 ml IVC IVC diam: 2.00 cm LEFT ATRIUM         Index      RIGHT ATRIUM           Index LA diam:    3.50 cm 1.74 cm/m RA Area:     14.00 cm                                RA Volume:   37.30 ml  18.57 ml/m   AORTA Ao Root diam: 3.20 cm TRICUSPID  VALVE TR Peak grad:   11.7 mmHg TR Vmax:        171.00 cm/s Olga Millers MD Electronically signed by Olga Millers MD Signature Date/Time: 03/14/2021/3:49:02 PM    Final     Assessment/Plan: 41 yo M with thalamic AVM s/p IVH - possible downgrade today or tomorrow - continue supportive care  LOS: 8 days     Bedelia Person 03/16/2021, 9:49 AM

## 2021-03-17 ENCOUNTER — Inpatient Hospital Stay (HOSPITAL_COMMUNITY): Payer: Self-pay

## 2021-03-17 ENCOUNTER — Other Ambulatory Visit: Payer: Self-pay

## 2021-03-17 LAB — BASIC METABOLIC PANEL
Anion gap: 8 (ref 5–15)
Anion gap: 7 (ref 5–15)
BUN: 19 mg/dL (ref 6–20)
BUN: 16 mg/dL (ref 6–20)
CO2: 20 mmol/L — ABNORMAL LOW (ref 22–32)
CO2: 22 mmol/L (ref 22–32)
Calcium: 8 mg/dL — ABNORMAL LOW (ref 8.9–10.3)
Calcium: 8.4 mg/dL — ABNORMAL LOW (ref 8.9–10.3)
Chloride: 107 mmol/L (ref 98–111)
Chloride: 107 mmol/L (ref 98–111)
Creatinine, Ser: 0.83 mg/dL (ref 0.61–1.24)
Creatinine, Ser: 0.8 mg/dL (ref 0.61–1.24)
GFR, Estimated: >60 mL/min (ref >60)
GFR, Estimated: >60 mL/min (ref >60)
Glucose, Bld: 113 mg/dL — ABNORMAL HIGH (ref 70–99)
Glucose, Bld: 107 mg/dL — ABNORMAL HIGH (ref 70–99)
Potassium: 4.7 mmol/L (ref 3.5–5.1)
Potassium: 3.9 mmol/L (ref 3.5–5.1)
Sodium: 135 mmol/L (ref 135–145)
Sodium: 136 mmol/L (ref 135–145)

## 2021-03-17 MED ORDER — SENNOSIDES 8.8 MG/5ML PO SYRP: 5.0000 mL | ORAL_SOLUTION | Freq: Two times a day (BID) | ORAL | Status: DC | PRN

## 2021-03-17 MED ORDER — ACETAMINOPHEN 500 MG PO TABS: 500.0000 mg | ORAL_TABLET | Freq: Four times a day (QID) | ORAL | Status: AC | PRN

## 2021-03-17 NOTE — Evaluation (Signed)
Speech Language Pathology Evaluation Patient Details Name: Casey Branch MRN: [id number] DOB: Nov 28, 1980 Today's Date: 03/17/2021 Time: 0910-0930 SLP Time Calculation (min) (ACUTE ONLY): 20 min  Problem List:  Patient Active Problem List   Diagnosis Date Noted   Intraventricular hemorrhage (HCC) 03/08/2021   Past Medical History:  Past Medical History:  Diagnosis Date   Brain aneurysm    Past Surgical History:  Past Surgical History:  Procedure Laterality Date   IR 3D INDEPENDENT WKST  03/11/2021   IR ANGIO INTRA EXTRACRAN SEL COM CAROTID INNOMINATE BILAT MOD SED  03/11/2021   IR ANGIO VERTEBRAL SEL VERTEBRAL BILAT MOD SED  03/11/2021   RADIOLOGY WITH ANESTHESIA N/A 03/11/2021   Procedure: IR WITH ANESTHESIA - DIAGNOSTIC CEREBRAL ANGIOGRAM;  Surgeon: Lisbeth Renshaw, MD;  Location: [facility] OR;  Service: Radiology;  Laterality: N/A;   VENTRICULOPERITONEAL SHUNT     HPI:  Pt is a 40 yo male who presented to [facility] ED with AMS and unilateral R-sided weakness after a syncopal event at home (heard by wife, found patient on floor). Code Stroke was initiated on arrival to ED.  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 S/p R frontal ventriculostomy 6/18. Pt intubated 6/18-24.  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 CXR 6/23 concerning for RLL infiltrate.  Cortrak placed 6/24.   Assessment / Plan / Recommendation Clinical Impression  Pt presents with cognitive impairments, no overt s/s of aphasia, with impaired initiation, attention, delayed response time, difficulty following multistep commands.  Disconjugate gaze.  Speech is fluent with no dysarthria - output is slow with flat prosody.  Affect flat.  Pt oriented to self only; answers "rehab?" when asked location. Recommend acute care SLP and f/u in CIR to address the above.   Additionally - f/u for swallowing reveals improved attention and oral function - pt is at higher risk  for silent aspiration given neuropathology. Recommend proceeding with MBS today to ensure safety with POs. He is scheduled for 1230.  D/W RN.     SLP Assessment  SLP Recommendation/Assessment: Patient needs continued Speech Lanaguage Pathology Services SLP Visit Diagnosis: Cognitive communication deficit (R41.841)    Follow Up Recommendations  Inpatient Rehab    Frequency and Duration min 3x week  2 weeks      SLP Evaluation Cognition  Overall Cognitive Status: Impaired/Different from baseline Arousal/Alertness: Awake/alert Orientation Level: Oriented to person;Disoriented to time;Disoriented to place Attention: Sustained Sustained Attention: Impaired Sustained Attention Impairment: Verbal basic Memory: Impaired Memory Impairment: Storage deficit Awareness: Impaired Awareness Impairment: Intellectual impairment Safety/Judgment: Impaired       Comprehension  Auditory Comprehension Overall Auditory Comprehension: Impaired Yes/No Questions: Impaired Complex Questions: 50-74% accurate Commands: Impaired Multistep Basic Commands: 50-74% accurate EffectiveTechniques: Extra processing time Visual Recognition/Discrimination Discrimination: Not tested Reading Comprehension Reading Status: Not tested    Expression Expression Primary Mode of Expression: Verbal Verbal Expression Overall Verbal Expression: Impaired Initiation: Impaired Level of Generative/Spontaneous Verbalization: Sentence Repetition: No impairment Naming: Impairment Responsive: 76-100% accurate Confrontation: Within functional limits Divergent: 50-74% accurate   Oral / Motor  Oral Motor/Sensory Function Overall Oral Motor/Sensory Function: Moderate impairment Facial Symmetry: Abnormal symmetry right;Suspected CN VII (facial) dysfunction Facial Strength: Reduced right;Suspected CN VII (facial) dysfunction Lingual Symmetry: Within Functional Limits Motor Speech Overall Motor Speech: Appears within  functional limits for tasks assessed   GO  Blenda Mounts Laurice 03/17/2021, 9:40 AM  Jill Side. Samson Frederic, MA CCC/SLP Acute Rehabilitation Services Office number [phone] Pager [phone]

## 2021-03-17 NOTE — Progress Notes (Signed)
Occupational Therapy Treatment Patient Details Name: Casey Branch MRN: [id number] DOB: [birth year] Today's Date: 03/17/2021    History of present illness Pt is a 40 y.o. male who presented 6/18 with a headache, confusion, then LOC with nausea and vomiting. 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 S/p R frontal ventriculostomy 6/18. Developed hydrocephalus s/p R F EVD 6/19. ETT 6/19 - 6/23. S/p diagnostic cerebral angiogram 6/21. EVD removed 6/23. Pt found to be COVID + on admission. + Rt UE DVT 6/24; ectopy and PVC 6/24 PMH: h/o prior intracranial hemorrhage with VPS.   OT comments  Pt noted to have a drift from L to R with eyes during session and dysconjugate gaze. Pt denies diplopia however appears likely. Pt reading clock with errors even when placed by pt in preferred L visual field. Pt neglecting the L lower quadrant of visual fields. Recommendations for CIR at this time. Pt progressed from bed to chair this session with increased arousal compared to evaluation.    Follow Up Recommendations  CIR    Equipment Recommendations  None recommended by OT    Recommendations for Other Services Rehab consult    Precautions / Restrictions Precautions Precautions: Fall Precaution Comments: L wrist restraint, posey, cortrak Restrictions Weight Bearing Restrictions: No       Mobility Bed Mobility Overal bed mobility: Needs Assistance Bed Mobility: Supine to Sit;Rolling Rolling: Min assist   Supine to sit: Mod assist;+2 for physical assistance     General bed mobility comments: Rolling to R/L for peri care with cues for initiation. Exiting towards left side of bed, with multimodal cues for sequencing. Pt attempting to put legs back onto bed.    Transfers Overall transfer level: Needs assistance Equipment used: None Transfers: Sit to/from [employer] Sit to Stand: Min assist;Mod assist;+2 physical assistance Stand pivot transfers: Min assist;+2  physical assistance       General transfer comment: Sit to stands x 2 with recliner placed anteriorly. Left knee block provided; multimodal cueing for left knee extension, upright posture, midline positioning. Pivoting towards left to chair with minA + 2, cues for sequencing/direction    Balance Overall balance assessment: Needs assistance Sitting-balance support: No upper extremity supported;Feet supported Sitting balance-Leahy Scale: Poor Sitting balance - Comments: Requiring minA, intermittent posterior and left lateral lean Postural control: Left lateral lean;Posterior lean Standing balance support: Bilateral upper extremity supported;During functional activity Standing balance-Leahy Scale: Poor Standing balance comment: reliant on external support                           ADL either performed or assessed with clinical judgement   ADL Overall ADL's : Needs assistance/impaired                     Lower Body Dressing: Maximal assistance;Bed level Lower Body Dressing Details (indicate cue type and reason): pt opening hands and correctly idenifies sock as a sock.Pt initiated pull up sock with backward chaining of sock on foot by OT Toilet Transfer: +2 for physical assistance;Minimal assistance     Toileting - Clothing Manipulation Details (indicate cue type and reason): incontinence supine and no awareness       General ADL Comments: pt motivated to be up in chair this session     Vision       Perception     Praxis      Cognition Arousal/Alertness: Awake/alert Behavior During Therapy: Flat affect Overall  Cognitive Status: Impaired/Different from baseline Area of Impairment: Orientation;Attention;Memory;Following commands;Safety/judgement;Awareness;Problem solving                 Orientation Level: Disoriented to;Place;Time;Situation Current Attention Level: Sustained Memory: Decreased recall of precautions;Decreased short-term  memory Following Commands: Follows one step commands with increased time Safety/Judgement: Decreased awareness of safety;Decreased awareness of deficits Awareness: Intellectual Problem Solving: Difficulty sequencing;Requires verbal cues;Slow processing General Comments: pt lacks awareness to incontinence of stool, reports month as May and needs cues of April May .... to state June with a question like response as if not sure, Pt able to recall 2022 after 5 minutes with delayed response time. Pt slow to process information and very soft voice tone        Exercises     Shoulder Instructions       General Comments VSS SBP 125 max during session    Pertinent Vitals/ Pain       Pain Assessment: No/denies pain  Home Living                                          Prior Functioning/Environment              Frequency  Min 2X/week        Progress Toward Goals  OT Goals(current goals can now be found in the care plan section)  Progress towards OT goals: Progressing toward goals  Acute Rehab OT Goals Patient Stated Goal: did not state OT Goal Formulation: Patient unable to participate in goal setting Time For Goal Achievement: 03/28/21 Potential to Achieve Goals: Good ADL Goals Pt Will Perform Grooming: with mod assist;sitting Pt Will Perform Upper Body Bathing: with max assist;sitting Additional ADL Goal #1: Pt will attend to simple self care task x 5 mins with min cues Additional ADL Goal #2: Will follow simple one step commands at least 40% of the time with min prompting Additional ADL Goal #3: Will maintain EOB sitting with mod A x 5 mins in prep for ADLs  Plan Discharge plan remains appropriate    Co-evaluation    PT/OT/SLP Co-Evaluation/Treatment: Yes Reason for Co-Treatment: To address functional/ADL transfers;For patient/therapist safety;Necessary to address cognition/behavior during functional activity   OT goals addressed during session:  ADL's and self-care;Strengthening/ROM;Proper use of Adaptive equipment and DME      AM-PAC OT "6 Clicks" Daily Activity     Outcome Measure   Help from another person eating meals?: A Lot Help from another person taking care of personal grooming?: A Lot Help from another person toileting, which includes using toliet, bedpan, or urinal?: A Lot Help from another person bathing (including washing, rinsing, drying)?: A Lot Help from another person to put on and taking off regular upper body clothing?: A Lot Help from another person to put on and taking off regular lower body clothing?: A Lot 6 Click Score: 12    End of Session Equipment Utilized During Treatment: Gait belt  OT Visit Diagnosis: Cognitive communication deficit (R41.841) Symptoms and signs involving cognitive functions: Nontraumatic intracerebral hemorrhage   Activity Tolerance Patient tolerated treatment well   Patient Left in chair;with call bell/phone within reach;with chair alarm set;with restraints reapplied   Nurse Communication Mobility status;Precautions        Time: 0109-3235 OT Time Calculation (min): 29 min  Charges: OT General Charges $OT Visit: 1 Visit OT Treatments $Self Care/Home Management :  8-22 mins   Timmothy Euler, OTR/L  Acute Rehabilitation Services Pager: [phone] Office: [phone] .    Mateo Flow 03/17/2021, 9:28 AM

## 2021-03-17 NOTE — Progress Notes (Signed)
NAME:  Casey Branch MRN:  [id number] DOB:  1981-03-05 LOS: 9 ADMISSION DATE:  03/08/2021 CONSULTATION DATE:  03/09/2021 REFERRING MD:  Maurice Small - NSGY CHIEF COMPLAINT:  ICH, ventilator management.   History of Present Illness:  40 year old man with PMHx significant for prior ICH (?secondary to brain aneurysm vs. AVM) s/p VP shunt placement who presented to [facility] ED with AMS and unilateral R-sided weakness after a syncopal event at home (heard by wife, found patient on floor). Code Stroke was initiated on arrival to ED.  Per chart review, on EMS arrival patient was altered with L gaze deviation and R hemiplegia. BP 130/84 and CBG 107. Vomited x 2, once at home and once on arrival to ED. Initial CT Head demonstrated subarachnoid blood in the posterior fossa with ventricular extension and early ventricular dilation and VP shunt noted (present admission). Patient was subsequently intubated in ED for airway protection, given AMS and neurologic exam. Admitted by NSGY; R frontal EVD placed and HTS 3% initiated. Repeat CT showed L thalamic hemorrhage with intraventricular extension with mass effect and R frontal EVD in place.  PCCM consulted for ventilator management. Incidentally COVID+.  Pertinent Medical History:  ICH (2/2 aneurysm, s/p VP shunt placement  Significant Hospital Events: Including procedures, antibiotic start and stop dates in addition to other pertinent events   6/18 - Presented to [facility] ED via EMS with AMS/R-sided hemiplegia/L gaze preference 6/19 - PCCM consulted for ventilator management post-intubation 6/20 - Increased agitation on vent, requiring increased sedation. Opens eyes, biting ETT, not following commands. Ongoing R-sided neglect. Remdesivir + Dexamethasone started for COVID. TA sent, Unasyn for aspiration PNA coverage. 6/21 - Improved agitation overnight, appears more comfortable. WBC/glucoses bumped, likely in the setting of steroids. MRI Angiogram today. 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 Clamping EVD. Switch precedex for propofol. 6/23 EVD removed, extubated; HTS stopped 6/24 low dose precedex, tmax 102, abx expanded, +dvt in RUE, neg LE DVTs 6/25 off precedex, following commands, oriented x 2, tmax 99.8, some drainage from EVD prior site, 2L [place], PCT 0.19, failed SLP  6/20 unasyn >>6/23 6/23 cefazolin 6/23 cefepime >> 6/26 6/23 vanc >> 6/26  6/20 remdesivir> 6/24  6/18 SARS >> positive  6/18 MRSA pcr > neg 6/20 trach asp >> group c strep, MSSA 6/23 Bcx2 >> ngtd 6/25 MRSA >> neg  6/24 RUE with DVT of right ulnar vein/ superficial right cephalic vein, indeterminate DVT right axillary vein 6/24 LE DVT neg Interim History / Subjective:  Tmax 102.1 at 1600 on 6/27 900 ml uop, x4 stool, +849 past 24 hours On room air Subjective: denies SOB, chest pain, pain.  Objective:  Blood pressure 126/79, pulse 67, temperature 99.3 F (37.4 C), temperature source Oral, resp. rate (!) 26, height 5\' 10"  (1.778 m), weight 78.6 kg, SpO2 95 %.        Intake/Output Summary (Last 24 hours) at 03/17/2021 0732 Last data filed at 03/17/2021 0600 Gross per 24 hour  Intake 1749.75 ml  Output 900 ml  Net 849.75 ml   Filed Weights   03/11/21 0446 03/12/21 0422 03/17/21 0454  Weight: 81.6 kg 82.9 kg 78.6 kg   Physical Examination: General:  in bed, appears comfortable, NAD HEENT: MM pink/moist, anicteric, atraumatic, cortrak in place Neuro: GCS 14, confused, RASS 0, PERRL 19mm, MAE L>R CV: S1S2, NSR on monitor, no m/r/g appreciated PULM:  clear in the upper lobes  and in the lower lobes, Trachea midline chest expansion symmetric GI: soft, bsx4 active, nontender Extremities: warm/dry, no pretibial edema, capillary refill less than 3 seconds  Skin: scalp site post procedure, no rashes or lesions noted   Resolved Hospital Problem List:   Iatrogenic  hypernatremia Delirium Hypokalemia Hypomag  Assessment & Plan:  L thalamic ICH 2/2 L thalamic AVM with acute hydrocephalus -- s/p R frontal EVD  Prior ICH in same location, s/p VP Shunt  Iatrogenic hypernatremia Acute encephalopathy in setting of ICH -Neurosurgery primary. Management per NSGY -Continue delirium precautions -Continue neuro checks and trending exams -Ongoing PT/OT/SLP. Appreciate assistance -Will need outpatient SRS for AVM -Reached out to Dr. Maurice Small regarding if ok to transfer to progressive care.   Acute hypoxic respiratory failure requiring MV due to encephalopathy- extubated 6/23 COVID-19, incidental RLL aspiration PNA - strep group C and staph in sputum Off airborne precautions 6/26. On Room air -Goal Spo2 92-98% -Continue pulmonary hygiene, OOB, DB and cough -Continue Aspiration precautions -7 days of antibiotics completed 6/27 (vanc/cefepime)  Fevers- Abx stopped on 6/26 after 7 days, suspect fever secondary to DVT and CNS injury -Continue to monitor Fever WBC curve -7 days of antibiotics completed 6/27 -BC 6/23 NGD4, follow up.  Dysphagia -Appreciate SLP evaluation. -Continue Cortrak  Ectopy-  K 4.7 -Goal K greater than 4.0, Mg greater than 2.0 -Evaluate on AM BMP  RUE DVT -No further AC given ICH/AVM per NSGY -Heparin for DVT PPX.   PCCM following for medical needs while in ICU. Reached out to Dr. Maurice Small regarding if ok to transfer to progressive care.  Best practice:  Diet:NPO, TF cortrak Pain/Anxiety/Delirium protocol (if indicated): n/a VAP protocol (if indicated): n/a DVT prophylaxis: Heparin DVT PPX GI prophylaxis: n/a Glucose control: N/A, trend on bmet Mobility: progress as able- pt/ot Code Status: full Family Communication: per primary; wife and sister updated at bedside 6/26 Disposition: Per primary, ready for transfer per PCCM    Gershon Mussel., MSN, APRN, AGACNP-BC [facility]   03/17/2021 , 7:32 AM  Please see [email or web address] for pager details  If no response, please call [phone] After hours, please call Elink at [phone]

## 2021-03-17 NOTE — Progress Notes (Addendum)
Physical Therapy Treatment Patient Details Name: Casey Branch MRN: [id number] DOB: 1980-09-29 Today's Date: 03/17/2021    History of Present Illness Pt is a 40 y.o. male who presented 6/18 with a headache, confusion, then LOC with nausea and vomiting. 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 S/p R frontal ventriculostomy 6/18. Developed hydrocephalus s/p R F EVD 6/19. ETT 6/19 - 6/23. S/p diagnostic cerebral angiogram 6/21. EVD removed 6/23. Pt found to be COVID + on admission. + Rt UE DVT 6/24; ectopy and PVC 6/24 PMH: h/o prior intracranial hemorrhage with VPS.    PT Comments    Pt making steady progress towards his physical therapy goals; exhibits improved arousal and command following. Pt is oriented to self only. Requiring min-mod assist (+2 safety) for bed mobility and transfers. BP 124/79, SpO2 92-93% on RA. Pt demonstrates R sided weakness, visual deficits, decreased cognition, balance deficits, and right inattention. Based on age and PLOF, suspect pt will continue to progress with mobility. Recommend CIR to address deficits and maximize functional mobility.     Follow Up Recommendations  CIR;Supervision/Assistance - 24 hour     Equipment Recommendations  Wheelchair (measurements PT);Wheelchair cushion (measurements PT);3in1 (PT)    Recommendations for Other Services Rehab consult     Precautions / Restrictions Precautions Precautions: Fall Precaution Comments: L wrist restraint, posey, cortrak Restrictions Weight Bearing Restrictions: No    Mobility  Bed Mobility Overal bed mobility: Needs Assistance Bed Mobility: Supine to Sit;Rolling Rolling: Min assist   Supine to sit: Mod assist;+2 for physical assistance     General bed mobility comments: Rolling to R/L for peri care with cues for initiation. Exiting towards left side of bed, with multimodal cues for sequencing. Pt attempting to put legs back onto bed.    Transfers Overall transfer level:  Needs assistance Equipment used: None Transfers: Sit to/from [employer] Sit to Stand: Min assist;Mod assist;+2 physical assistance Stand pivot transfers: Min assist;+2 physical assistance       General transfer comment: Sit to stands x 2 with recliner placed anteriorly. Right knee block provided; multimodal cueing for right knee extension, upright posture, midline positioning. Pivoting towards left to chair with minA + 2, cues for sequencing/direction  Ambulation/Gait                 Stairs             Wheelchair Mobility    Modified Rankin (Stroke Patients Only) Modified Rankin (Stroke Patients Only) Pre-Morbid Rankin Score: No symptoms Modified Rankin: Moderately severe disability     Balance Overall balance assessment: Needs assistance Sitting-balance support: No upper extremity supported;Feet supported Sitting balance-Leahy Scale: Poor Sitting balance - Comments: Requiring minA, intermittent posterior and left lateral lean Postural control: Left lateral lean;Posterior lean Standing balance support: Bilateral upper extremity supported;During functional activity Standing balance-Leahy Scale: Poor Standing balance comment: reliant on external support                            Cognition Arousal/Alertness: Awake/alert Behavior During Therapy: Flat affect Overall Cognitive Status: Impaired/Different from baseline Area of Impairment: Orientation;Attention;Memory;Following commands;Safety/judgement;Awareness;Problem solving                 Orientation Level: Disoriented to;Place;Time;Situation Current Attention Level: Sustained Memory: Decreased short-term memory Following Commands: Follows one step commands with increased time Safety/Judgement: Decreased awareness of safety;Decreased awareness of deficits Awareness: Intellectual Problem Solving: Difficulty sequencing;Requires verbal cues;Slow processing General  Comments: Pt  oriented to self only. States month is May; unable to recall correct information after being re-oriented. Demonstrates slow processing speeds, but able to follow 1 step commands with increased time.      Exercises      General Comments        Pertinent Vitals/Pain Pain Assessment: No/denies pain    Home Living                      Prior Function            PT Goals (current goals can now be found in the care plan section) Acute Rehab PT Goals Patient Stated Goal: did not state PT Goal Formulation: Patient unable to participate in goal setting Time For Goal Achievement: 03/28/21 Potential to Achieve Goals: Good Progress towards PT goals: Progressing toward goals    Frequency    Min 4X/week      PT Plan Current plan remains appropriate    Co-evaluation PT/OT/SLP Co-Evaluation/Treatment: Yes Reason for Co-Treatment: To address functional/ADL transfers;For patient/therapist safety          AM-PAC PT "6 Clicks" Mobility   Outcome Measure  Help needed turning from your back to your side while in a flat bed without using bedrails?: A Little Help needed moving from lying on your back to sitting on the side of a flat bed without using bedrails?: A Lot Help needed moving to and from a bed to a chair (including a wheelchair)?: A Lot Help needed standing up from a chair using your arms (e.g., wheelchair or bedside chair)?: A Lot Help needed to walk in hospital room?: Total Help needed climbing 3-5 steps with a railing? : Total 6 Click Score: 11    End of Session   Activity Tolerance: Patient tolerated treatment well Patient left: with call bell/phone within reach;in chair;with chair alarm set;with restraints reapplied Nurse Communication: Mobility status PT Visit Diagnosis: Muscle weakness (generalized) (M62.81);Difficulty in walking, not elsewhere classified (R26.2);Other symptoms and signs involving the nervous system (R29.898)     Time: 0828-0900 PT  Time Calculation (min) (ACUTE ONLY): 32 min  Charges:  $Therapeutic Activity: 8-22 mins                     Lillia Pauls, PT, DPT Acute Rehabilitation Services Pager [phone] Office [phone]    Norval Morton 03/17/2021, 9:22 AM

## 2021-03-17 NOTE — Progress Notes (Signed)
   Providing Compassionate, Quality Care - Together  NEUROSURGERY PROGRESS NOTE   S: no issues  O: EXAM:  BP 128/80   Pulse 62   Temp 99.7 F (37.6 C) (Axillary)   Resp (!) 25   Ht 5\' 10"  (1.778 m)   Wt 78.6 kg   SpO2 99%   BMI 24.86 kg/m     Awake, alert,states name Answers y/n Tracks around room Slight disconjugate gaze Spont moves L arm/leg WD RLE/RUE FC in LUE   ASSESSMENT:  40 y.o. male with   L thalamic AVM, grade 4, presented with hemorrhage/hydrocephalus   -s/p EVD removed 6/23   PLAN: - sbp control - sz prophylaxis - cont supportive care      Thank you for allowing me to participate in this patient's care.  Please do not hesitate to call with questions or concerns.   7/23, DO Neurosurgeon [facility] Cell: [phone]

## 2021-03-17 NOTE — Progress Notes (Signed)
Modified Barium Swallow Progress Note  Patient Details  Name: [name] MRN: [id number] Date of Birth: 09/04/1981  Today's Date: 03/17/2021  Modified Barium Swallow completed.  Full report located under Chart Review in the Imaging Section.  Brief recommendations include the following:  Clinical Impression  Pt presents with excellent oropharyngeal swallow function with adequate mastication, timely pharyngeal swallow response, reliable laryngeal vestibule closure with no penetration nor aspiration, no residue post-swallow.  Recommend initiating a regular diet; thin liquids. Pt did have difficulty with manipulating 13 mm barium pill - (give whole with puree) - and difficulty following some instructions for participation.  SLP will follow-up x1 to ensure safety; otherwise will follow for cognitive-communication.   Swallow Evaluation Recommendations       SLP Diet Recommendations: Regular solids;Thin liquid   Liquid Administration via: Cup;Straw   Medication Administration: Whole meds with puree   Supervision: Staff to assist with self feeding   Compensations: Minimize environmental distractions       Oral Care Recommendations: Oral care BID       Finnis Colee L. Samson Frederic, MA CCC/SLP Acute Rehabilitation Services Office number [phone] Pager [phone]  Blenda Mounts Laurice 03/17/2021,1:11 PM

## 2021-03-17 NOTE — Progress Notes (Signed)
Inpatient Rehab Admissions Coordinator Note:   Per therapy recommendations, pt was screened for CIR candidacy by Aubery Date, MS CCC-SLP. At this time, Pt. Appears to have functional decline and is a good candidate for CIR. Will place order for rehab consult per protocol.  Please contact me with questions.   Kore Madlock, MS, CCC-SLP Rehab Admissions Coordinator  [phone] (celll) [phone] (office)  

## 2021-03-18 LAB — CBC
HCT: 38.4 % — ABNORMAL LOW (ref 39.0–52.0)
Hemoglobin: 13.4 g/dL (ref 13.0–17.0)
MCH: 31.3 pg (ref 26.0–34.0)
MCHC: 34.9 g/dL (ref 30.0–36.0)
MCV: 89.7 fL (ref 80.0–100.0)
Platelets: 230 10*3/uL (ref 150–400)
RBC: 4.28 MIL/uL (ref 4.22–5.81)
RDW: 13.2 % (ref 11.5–15.5)
WBC: 10.1 10*3/uL (ref 4.0–10.5)
nRBC: 0 % (ref 0.0–0.2)

## 2021-03-18 LAB — CULTURE, BLOOD (ROUTINE X 2)
Culture: NO GROWTH
Culture: NO GROWTH
Special Requests: ADEQUATE
Special Requests: ADEQUATE

## 2021-03-18 LAB — BASIC METABOLIC PANEL
Anion gap: 13 (ref 5–15)
BUN: 14 mg/dL (ref 6–20)
CO2: 23 mmol/L (ref 22–32)
Calcium: 8.6 mg/dL — ABNORMAL LOW (ref 8.9–10.3)
Chloride: 100 mmol/L (ref 98–111)
Creatinine, Ser: 0.81 mg/dL (ref 0.61–1.24)
GFR, Estimated: >60 mL/min (ref >60)
Glucose, Bld: 100 mg/dL — ABNORMAL HIGH (ref 70–99)
Potassium: 3.7 mmol/L (ref 3.5–5.1)
Sodium: 136 mmol/L (ref 135–145)

## 2021-03-18 LAB — MAGNESIUM: Magnesium: 2.3 mg/dL (ref 1.7–2.4)

## 2021-03-18 MED ORDER — ENSURE ENLIVE PO LIQD: 237.0000 mL | Freq: Two times a day (BID) | ORAL | Status: DC

## 2021-03-18 NOTE — Progress Notes (Signed)
Inpatient Rehab Admissions Coordinator:   I spoke with this Pt. And wife regarding potential CIR admit and they stated interest. I will follow for potential admit pending insurance and bed availability.   Megan Salon, MS, CCC-SLP Rehab Admissions Coordinator  [phone] (celll) [phone] (office)

## 2021-03-18 NOTE — Final Consult Note (Signed)
Brief follow-up consult  41 year old male prior ICH secondary to brain aneurysm/AVM + VP shunt placement Presented 6/18 from home?  Code stroke syncopal episode right-sided neglect weakness and slurred speech CT head = subarachnoid blood posterior fossa + ventricular extension Neuro exam-obtunded right hemiplegia-loaded with [facility] Neurosurgery Dr. Johnsie Cancel consulted-admitted by PCCM  Underwent right frontal ventriculostomy Dr. Johnsie Cancel 03/08/2021 Hypertonic saline 3% initiated repeat CT = left thalamic hemorrhage with extension and mass-effect   Patient looks well and has no distress he is able to move all 4 limbs equally He is not agitated confused and follows commands fairly well Fever curve has been stable since 626 with no high-grade fevers above 100 since then It appears that plans are in place for him to go to CIR Medically there are no new issues he does not seem delirious agitated--he has completed 7 days of antibiotics for MSSA trach aspirate and group C strep His right upper extremity DVT is being managed conservatively only  We will follow peripherally for medical issues if needed otherwise we will relinquish care back to Dr. Johnsie Cancel of neurosurgery  Please call me directly 03/18/21 if you have medical questions/concerns   otherwise please page the flow manager if further assistance is required after 6/28 for [facility] hospitalist input  Casey Koch, MD [facility] 11:29 AM

## 2021-03-18 NOTE — Progress Notes (Signed)
Physical Therapy Treatment Patient Details Name: Casey Branch MRN: [id number] DOB: [birth year] Today's Date: 03/18/2021    History of Present Illness Pt is a 40 y.o. male who presented 6/18 with a headache, confusion, then LOC with nausea and vomiting. 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 S/p R frontal ventriculostomy 6/18. Developed hydrocephalus s/p R F EVD 6/19. ETT 6/19 - 6/23. S/p diagnostic cerebral angiogram 6/21. EVD removed 6/23. Pt found to be COVID + on admission. + Rt UE DVT 6/24; ectopy and PVC 6/24 PMH: h/o prior intracranial hemorrhage with VPS.    PT Comments    Pt making steady progress towards his physical therapy goals. Session focused on initiation of gait training. Pt ambulating x 25 feet with a walker, mod-max assist, and close chair follow. Demonstrates R sided weakness, inattention, balance deficits, decreased cognition. Based on age, PLOF and motivation, suspect excellent progress. Continue to recommend comprehensive inpatient rehab (CIR) for post-acute therapy needs.    Follow Up Recommendations  CIR;Supervision/Assistance - 24 hour     Equipment Recommendations  Wheelchair (measurements PT);Wheelchair cushion (measurements PT);3in1 (PT);Rolling walker with 5" wheels    Recommendations for Other Services       Precautions / Restrictions Precautions Precautions: Fall Precaution Comments: posey, cortrak, R inattention Restrictions Weight Bearing Restrictions: No    Mobility  Bed Mobility Overal bed mobility: Needs Assistance Bed Mobility: Supine to Sit     Supine to sit: Mod assist;+2 for safety/equipment     General bed mobility comments: Exiting towards right side of bed with increased difficulty (likely due to decreased R sided attention), initiation and assist for BLE's off edge of bed and pt able to manage trunk to upright with guidance    Transfers Overall transfer level: Needs assistance Equipment used: None Transfers:  Sit to/from Stand Sit to Stand: Mod assist;+2 safety/equipment         General transfer comment: ModA to stand, R knee blocked, multimodal cues for wider BOS  Ambulation/Gait Ambulation/Gait assistance: Mod assist;Max assist;+2 safety/equipment;+2 physical assistance Gait Distance (Feet): 25 Feet Assistive device: Rolling walker (2 wheeled) Gait Pattern/deviations: Step-through pattern;Decreased dorsiflexion - right;Leaning posteriorly;Narrow base of support;Decreased step length - right;Decreased stance time - right Gait velocity: decreased Gait velocity interpretation: <1.8 ft/sec, indicate of risk for recurrent falls General Gait Details: Pt requiring modA consistently for balance and close chair follow. Cues for wider BOS, midline positioning (tendency for posterior and right lateral lean), stepping initiation and larger right step length. Pt with one posterior LOB requiring maxA to correct and assisted back into chair   Stairs             Wheelchair Mobility    Modified Rankin (Stroke Patients Only) Modified Rankin (Stroke Patients Only) Pre-Morbid Rankin Score: No symptoms Modified Rankin: Moderately severe disability     Balance Overall balance assessment: Needs assistance Sitting-balance support: No upper extremity supported;Feet supported Sitting balance-Leahy Scale: Poor Sitting balance - Comments: Requiring minA, intermittent posterior and left lateral lean Postural control: Left lateral lean;Posterior lean Standing balance support: Bilateral upper extremity supported;During functional activity Standing balance-Leahy Scale: Poor Standing balance comment: reliant on external support                            Cognition Arousal/Alertness: Awake/alert Behavior During Therapy: Flat affect Overall Cognitive Status: Impaired/Different from baseline Area of Impairment: Orientation;Attention;Memory;Following commands;Safety/judgement;Awareness;Problem  solving  Orientation Level: Disoriented to;Place;Time;Situation Current Attention Level: Sustained Memory: Decreased short-term memory Following Commands: Follows one step commands with increased time Safety/Judgement: Decreased awareness of safety;Decreased awareness of deficits Awareness: Intellectual Problem Solving: Difficulty sequencing;Requires verbal cues;Slow processing General Comments: Demonstrates slow processing speeds, but able to follow 1 step commands with increased time.      Exercises      General Comments        Pertinent Vitals/Pain Pain Assessment: No/denies pain    Home Living                      Prior Function            PT Goals (current goals can now be found in the care plan section) Acute Rehab PT Goals Patient Stated Goal: did not state PT Goal Formulation: Patient unable to participate in goal setting Time For Goal Achievement: 03/28/21 Potential to Achieve Goals: Good Progress towards PT goals: Progressing toward goals    Frequency    Min 4X/week      PT Plan Current plan remains appropriate    Co-evaluation              AM-PAC PT "6 Clicks" Mobility   Outcome Measure  Help needed turning from your back to your side while in a flat bed without using bedrails?: A Little Help needed moving from lying on your back to sitting on the side of a flat bed without using bedrails?: A Lot Help needed moving to and from a bed to a chair (including a wheelchair)?: A Lot Help needed standing up from a chair using your arms (e.g., wheelchair or bedside chair)?: A Lot Help needed to walk in hospital room?: Total Help needed climbing 3-5 steps with a railing? : Total 6 Click Score: 11    End of Session   Activity Tolerance: Patient tolerated treatment well Patient left: with call bell/phone within reach;in chair;with chair alarm set;with restraints reapplied Nurse Communication: Mobility status PT Visit  Diagnosis: Muscle weakness (generalized) (M62.81);Difficulty in walking, not elsewhere classified (R26.2);Other symptoms and signs involving the nervous system (R29.898)     Time: 9024-0973 PT Time Calculation (min) (ACUTE ONLY): 18 min  Charges:  $Gait Training: 8-22 mins                     Lillia Pauls, PT, DPT Acute Rehabilitation Services Pager [phone] Office [phone]    Norval Morton 03/18/2021, 1:34 PM

## 2021-03-18 NOTE — Progress Notes (Signed)
Nutrition Follow-up  DOCUMENTATION CODES:   Not applicable  INTERVENTION:   Ensure Enlive po BID, each supplement provides 350 kcal and 20 grams of protein  Magic cup TID with meals, each supplement provides 290 kcal and 9 grams of protein  Encourage PO intake.   Change to Room Service with assist   NUTRITION DIAGNOSIS:   Inadequate oral intake related to lethargy/confusion as evidenced by meal completion < 50%. Ongoing.   GOAL:   Patient will meet greater than or equal to 90% of their needs Progressing with diet advancement   MONITOR:   PO intake, Supplement acceptance  REASON FOR ASSESSMENT:   Ventilator, Consult Enteral/tube feeding initiation and management  ASSESSMENT:   40 yo male with h/o prior ICH with VPS who presents with confusion followed by LOC with N/V and admitted with likely recurrent AVM hemorrhage-VPS likely non-functional. Pt developed respiratory failure due to encephalopathy from brain bleed requiring intubation. COVID-19+-incidental finding.  Pt discussed during ICU rounds and with RN.  Per RN pt is still confused and did not know what to do with his Breakfast tray. Pt is working with therapy currently.  Concern pt will have limited intake, will offer supplementation.  Per RN pt can feed himself with his left hand, wife in and out.    6/18 - [facility] ED via EMS with AMS/R-sided hemiplegia/L gaze preference 6/19 - PCCM consulted for ventilator management post-intubation 6/20 - Increased agitation on vent, requiring increased sedation. Opens eyes, biting ETT, not following commands. Ongoing R-sided neglect 6/21 - Improved agitation overnight, appears more comfortable. 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 Clamping EVD. Switch precedex for propofol. 6/23 - EVD removed; extubated  6/24 - plan for Cortrak placement 6/27 diet  advanced to Regular, cortrak removed  Labs and Meds reviewed  Diet Order:   Diet Order             Diet regular Room service appropriate? Yes; Fluid consistency: Thin  Diet effective now                   EDUCATION NEEDS:   Not appropriate for education at this time  Skin:  Skin Assessment: Reviewed RN Assessment  Last BM:  6/27 x 2  Height:   Ht Readings from Last 1 Encounters:  03/13/21 5\' 10"  (1.778 m)    Weight:   Wt Readings from Last 1 Encounters:  03/18/21 78 kg    Ideal Body Weight:     BMI:  Body mass index is 24.67 kg/m.  Estimated Nutritional Needs:   Kcal:  1900-2200 kcals  Protein:  105-130 g  Fluid:  >/= 2 L  Veasna Santibanez P., RD, LDN, CNSC See AMiON for contact information

## 2021-03-18 NOTE — Progress Notes (Signed)
   Providing Compassionate, Quality Care - Together  NEUROSURGERY PROGRESS NOTE   S: No issues overnight.  Resting comfortably.  Awakens easily.  O: EXAM:  BP 139/80   Pulse (!) 59   Temp 97.6 F (36.4 C) (Axillary)   Resp (!) 26   Ht 5\' 10"  (1.778 m)   Wt 78 kg   SpO2 96%   BMI 24.67 kg/m   Awake, alert, states his name. Face symmetric disconjugate gaze Incision clean dry and intact Follows commands x4, moves all extremities  ASSESSMENT:  40 y.o. male with  L thalamic AVM, grade 4, presented with hemorrhage/hydrocephalus   -s/p EVD removed 6/23   PLAN: - sbp control - sz prophylaxis - cont supportive care -Progressive bed pending -Rehab planning -Overall stable       Thank you for allowing me to participate in this patient's care.  Please do not hesitate to call with questions or concerns.   7/23, DO Neurosurgeon [facility] Cell: [phone]

## 2021-03-18 NOTE — Hospital Course (Signed)
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  Code stroke syncopal episode right-sided neglect weakness and slurred speech CT head = subarachnoid blood posterior fossa + ventricular extension Neuro exam-obtunded right hemiplegia-loaded with [facility] Neurosurgery Dr. Johnsie Cancel consulted-admitted by PCCM  Underwent right frontal ventriculostomy Dr. Johnsie Cancel 03/08/2021 Hypertonic saline 3% initiated repeat CT = left thalamic hemorrhage with extension and mass-effect

## 2021-03-19 ENCOUNTER — Inpatient Hospital Stay (HOSPITAL_COMMUNITY)
Admission: RE | Admit: 2021-03-19 | Discharge: 2021-04-02 | DRG: 057 | Disposition: A | Discharge status: Home / Self Care | Payer: Self-pay | Source: Intra-hospital | Attending: Physical Medicine and Rehabilitation | Admitting: Physical Medicine and Rehabilitation

## 2021-03-19 DIAGNOSIS — I69151 Hemiplegia and hemiparesis following nontraumatic intracerebral hemorrhage affecting right dominant side: Principal | ICD-10-CM

## 2021-03-19 DIAGNOSIS — Z982 Presence of cerebrospinal fluid drainage device: Secondary | ICD-10-CM

## 2021-03-19 DIAGNOSIS — G914 Hydrocephalus in diseases classified elsewhere: Secondary | ICD-10-CM | POA: Diagnosis present

## 2021-03-19 DIAGNOSIS — G4700 Insomnia, unspecified: Secondary | ICD-10-CM | POA: Diagnosis present

## 2021-03-19 DIAGNOSIS — I82A11 Acute embolism and thrombosis of right axillary vein: Secondary | ICD-10-CM | POA: Diagnosis present

## 2021-03-19 DIAGNOSIS — R451 Restlessness and agitation: Secondary | ICD-10-CM | POA: Diagnosis present

## 2021-03-19 DIAGNOSIS — E876 Hypokalemia: Secondary | ICD-10-CM | POA: Diagnosis present

## 2021-03-19 DIAGNOSIS — R159 Full incontinence of feces: Secondary | ICD-10-CM | POA: Diagnosis present

## 2021-03-19 DIAGNOSIS — F17200 Nicotine dependence, unspecified, uncomplicated: Secondary | ICD-10-CM | POA: Diagnosis present

## 2021-03-19 DIAGNOSIS — K5900 Constipation, unspecified: Secondary | ICD-10-CM | POA: Diagnosis present

## 2021-03-19 DIAGNOSIS — R414 Neurologic neglect syndrome: Secondary | ICD-10-CM | POA: Diagnosis present

## 2021-03-19 DIAGNOSIS — R000 Tachycardia, unspecified: Secondary | ICD-10-CM | POA: Diagnosis not present

## 2021-03-19 DIAGNOSIS — I69128 Other speech and language deficits following nontraumatic intracerebral hemorrhage: Secondary | ICD-10-CM

## 2021-03-19 DIAGNOSIS — I615 Nontraumatic intracerebral hemorrhage, intraventricular: Secondary | ICD-10-CM

## 2021-03-19 DIAGNOSIS — Z8616 Personal history of COVID-19: Secondary | ICD-10-CM

## 2021-03-19 DIAGNOSIS — I619 Nontraumatic intracerebral hemorrhage, unspecified: Secondary | ICD-10-CM | POA: Diagnosis present

## 2021-03-19 MED ORDER — POLYVINYL ALCOHOL 1.4 % OP SOLN
1.0000 [drp] | Freq: Two times a day (BID) | OPHTHALMIC | Status: DC
  Administered 2021-03-19 – 2021-04-01 (×26): 1 [drp] via OPHTHALMIC
  Filled 2021-03-19: qty 15

## 2021-03-19 MED ORDER — IPRATROPIUM-ALBUTEROL 0.5-2.5 (3) MG/3ML IN SOLN: 3.0000 mL | Freq: Four times a day (QID) | RESPIRATORY_TRACT | Status: DC | PRN

## 2021-03-19 MED ORDER — ENSURE ENLIVE PO LIQD
237.0000 mL | Freq: Two times a day (BID) | ORAL | Status: DC
  Administered 2021-03-20: 237 mL via ORAL

## 2021-03-19 MED ORDER — HEPARIN SODIUM (PORCINE) 5000 UNIT/ML IJ SOLN: 5000.0000 [IU] | Freq: Three times a day (TID) | INTRAMUSCULAR | Status: DC

## 2021-03-19 MED ORDER — HEPARIN SODIUM (PORCINE) 5000 UNIT/ML IJ SOLN
5000.0000 [IU] | Freq: Three times a day (TID) | INTRAMUSCULAR | Status: DC
  Administered 2021-03-19 – 2021-04-02 (×39): 5000 [IU] via SUBCUTANEOUS
  Filled 2021-03-19 (×40): qty 1

## 2021-03-19 MED ORDER — LORAZEPAM 0.5 MG PO TABS
1.0000 mg | ORAL_TABLET | Freq: Once | ORAL | Status: AC
  Administered 2021-03-19: 1 mg via ORAL
  Filled 2021-03-19: qty 2

## 2021-03-19 NOTE — H&P (Signed)
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  Per chart review lives with spouse independent prior to admission.  Presented 03/08/2021 with altered mental status and right-sided weakness, with slurred speech nausea and vomiting of acute onset with left gaze preference.  There was a reported unwitnessed fall.  He was intubated for airway protection.  Admission chemistries unremarkable except potassium 3.4, glucose 138, hemoglobin 14.6, SARS coronavirus positive.  Cranial CT scan showed large volume intraventricular hemorrhage primarily in the left lateral ventricle but also in the right ventricle and third ventricle.  Increased density and mild increase in thickness in the tentorium bilaterally possibly with layering of blood.  Mild hydrocephalus.  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  No cerebral aneurysm.  He was initially placed on 3% hypertonic saline.  Echocardiogram with ejection fraction of 50 to 55% no wall motion abnormalities.  Patient underwent right frontal ventriculostomy 03/08/2021 per Dr. Johnsie Cancel followed by diagnostic cerebral angiogram per Dr. 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  Venous Doppler right upper extremity showed a small acute superficial vein thrombosis involving the right cephalic vein and age-indeterminate DVT right axillary vein.  Patient was cleared to begin subcutaneous heparin for DVT prophylaxis and would not need long-term anticoagulation.  Venous Doppler studies negative.  Patient did complete  a 7-day course of antibiotics for MSSA trach aspirate and group C strep.  In regards to patient's incidental finding of COVID patient was treated airborne precautions until 03/16/2021 and discontinued.  Patient did initially have bouts of agitation restlessness maintained on low-dose Precedex.  Maintained on a regular consistency diet.  Therapy evaluations completed due to patient decreased functional mobility was admitted for a comprehensive rehab program.   Review of Systems Constitutional:  Positive for fever. Negative for chills. HENT:  Negative for hearing loss.   Eyes:  Negative for blurred vision and double vision. Respiratory:  Negative for cough and shortness of breath.   Cardiovascular:  Negative for chest pain, palpitations and leg swelling. Gastrointestinal:  Positive for nausea and vomiting. Genitourinary:  Negative for dysuria, flank pain and hematuria. Musculoskeletal:        Reported recent unwitnessed fall  Skin:  Negative for rash. Neurological:  Positive for weakness and headaches. Negative for seizures.  All other systems reviewed and are negative.     Past Medical History:  Diagnosis Date   Brain aneurysm           Past Surgical History:  Procedure Laterality Date   IR 3D INDEPENDENT WKST   03/11/2021   IR ANGIO INTRA EXTRACRAN SEL COM CAROTID INNOMINATE BILAT MOD SED   03/11/2021   IR ANGIO VERTEBRAL SEL VERTEBRAL BILAT MOD SED   03/11/2021   RADIOLOGY WITH ANESTHESIA N/A 03/11/2021    Procedure: IR WITH ANESTHESIA - DIAGNOSTIC CEREBRAL ANGIOGRAM;  Surgeon: Lisbeth Renshaw, MD;  Location: [facility] OR;  Service: Radiology;  Laterality: N/A;   VENTRICULOPERITONEAL SHUNT        No family history on file. Social History:  reports that he has been smoking. He does not have any smokeless  tobacco history on file. He reports current alcohol use. He reports that he does not use drugs. Allergies: No Known Allergies No medications prior to admission.      Drug Regimen  Review Drug regimen was reviewed and remains appropriate with no significant issues identified   Home: Home Living Family/patient expects to be discharged to:: Unsure Living Arrangements: Spouse/significant other Available Help at Discharge: Family, Available 24 hours/day Type of Home: House Home Access: Level entry Home Layout: One level Bathroom Shower/Tub: Tub only, Tub/shower unit, Health visitor: Standard Bathroom Accessibility: Yes Additional Comments: Pt unable to provide info and no family present  Lives With: Spouse, Family   Functional History: Prior Function Level of Independence: Independent Comments: per chart, pt appears to have been independent, but pt unable to provide info and no family present   Functional Status:  Mobility: Bed Mobility Overal bed mobility: Needs Assistance Bed Mobility: Supine to Sit Rolling: Min assist Supine to sit: Mod assist, +2 for safety/equipment Sit to supine: Total assist, +2 for physical assistance, +2 for safety/equipment, HOB elevated General bed mobility comments: Exiting towards right side of bed with increased difficulty (likely due to decreased R sided attention), initiation and assist for BLE's off edge of bed and pt able to manage trunk to upright with guidance Transfers Overall transfer level: Needs assistance Equipment used: None Transfers: Sit to/from Stand Sit to Stand: Mod assist, +2 safety/equipment Stand pivot transfers: Min assist, +2 physical assistance General transfer comment: ModA to stand, R knee blocked, multimodal cues for wider BOS Ambulation/Gait Ambulation/Gait assistance: Mod assist, Max assist, +2 safety/equipment, +2 physical assistance Gait Distance (Feet): 25 Feet Assistive device: Rolling walker (2 wheeled) Gait Pattern/deviations: Step-through pattern, Decreased dorsiflexion - right, Leaning posteriorly, Narrow base of support, Decreased step length - right, Decreased stance time  - right General Gait Details: Pt requiring modA consistently for balance and close chair follow. Cues for wider BOS, midline positioning (tendency for posterior and right lateral lean), stepping initiation and larger right step length. Pt with one posterior LOB requiring maxA to correct and assisted back into chair Gait velocity: decreased Gait velocity interpretation: <1.8 ft/sec, indicate of risk for recurrent falls   ADL: ADL Overall ADL's : Needs assistance/impaired Eating/Feeding: NPO Lower Body Dressing: Maximal assistance, Bed level Lower Body Dressing Details (indicate cue type and reason): pt opening hands and correctly idenifies sock as a sock.Pt initiated pull up sock with backward chaining of sock on foot by OT Toilet Transfer: +2 for physical assistance, Minimal assistance Toileting - Clothing Manipulation Details (indicate cue type and reason): incontinence supine and no awareness General ADL Comments: pt motivated to be up in chair this session   Cognition: Cognition Overall Cognitive Status: Impaired/Different from baseline Arousal/Alertness: Awake/alert Orientation Level: Oriented to person, Oriented to place Attention: Sustained Sustained Attention: Impaired Sustained Attention Impairment: Verbal basic Memory: Impaired Memory Impairment: Storage deficit Awareness: Impaired Awareness Impairment: Intellectual impairment Safety/Judgment: Impaired Cognition Arousal/Alertness: Awake/alert Behavior During Therapy: Flat affect Overall Cognitive Status: Impaired/Different from baseline Area of Impairment: Orientation, Attention, Memory, Following commands, Safety/judgement, Awareness, Problem solving Orientation Level: Disoriented to, Place, Time, Situation Current Attention Level: Sustained Memory: Decreased short-term memory Following Commands: Follows one step commands with increased time Safety/Judgement: Decreased awareness of safety, Decreased awareness of  deficits Awareness: Intellectual Problem Solving: Difficulty sequencing, Requires verbal cues, Slow processing General Comments: Demonstrates slow processing speeds, but able to follow 1 step commands with increased time.   Physical Exam: Blood pressure 136/71, pulse 61,  temperature 98.6 F (37 C), temperature source Oral, resp. rate 19, height  (1.778 m), weight 78 kg, SpO2 (!) 88 %. Physical Exam Neurological:    Comments: Patient is somnolent mood is flat.  Displays impaired initiation and attention.  He was able to provide his name.  Some delay in response time.  Inconsistent to follow commands.  Speech is dysarthric General: No acute distress Mood and affect are appropriate Heart: Regular rate and rhythm no rubs murmurs or extra sounds Lungs: Clear to auscultation, breathing unlabored, no rales or wheezes Abdomen: Positive bowel sounds, soft nontender to palpation, nondistended Extremities: No clubbing, cyanosis, or edema Skin: No evidence of breakdown, no evidence of rash Neurologic: Cranial nerves II through XII intact, motor strength is 4/5 in left deltoid, bicep, tricep, grip, hip flexor, knee extensors, ankle dorsiflexor and plantar flexor, trace right deltoid bicep tricep grip hip flexor knee extensor ankle dorsiflexor plantar flexor, patient is somnolent question level of effort Sensory exam patient indicates equal sensation to pinch bilateral fingers and toes. Cerebellar exam unable to cooperate Musculoskeletal: Full range of motion in all 4 extremities. No joint swelling, no pain with active assisted range of motion.   Lab Results Last 48 Hours        Results for orders placed or performed during the hospital encounter of 03/08/21 (from the past 48 hour(s))  Basic metabolic panel     Status: Abnormal    Collection Time: 03/17/21  7:57 PM  Result Value Ref Range    Sodium 136 135 - 145 mmol/L    Potassium 3.9 3.5 - 5.1 mmol/L    Chloride 107 98 - 111 mmol/L    CO2  22 22 - 32 mmol/L    Glucose, Bld 107 (H) 70 - 99 mg/dL      Comment: Glucose reference range applies only to samples taken after fasting for at least 8 hours.    BUN 16 6 - 20 mg/dL    Creatinine, Ser 4.69 0.61 - 1.24 mg/dL    Calcium 8.4 (L) 8.9 - 10.3 mg/dL    GFR, Estimated >62 >95 mL/min      Comment: (NOTE) Calculated using the CKD-EPI Creatinine Equation (2021)      Anion gap 7 5 - 15      Comment: Performed at [facility], [address], [place], [place] [address]  Basic metabolic panel     Status: Abnormal    Collection Time: 03/18/21  6:03 AM  Result Value Ref Range    Sodium 136 135 - 145 mmol/L    Potassium 3.7 3.5 - 5.1 mmol/L    Chloride 100 98 - 111 mmol/L    CO2 23 22 - 32 mmol/L    Glucose, Bld 100 (H) 70 - 99 mg/dL      Comment: Glucose reference range applies only to samples taken after fasting for at least 8 hours.    BUN 14 6 - 20 mg/dL    Creatinine, Ser 2.44 0.61 - 1.24 mg/dL    Calcium 8.6 (L) 8.9 - 10.3 mg/dL    GFR, Estimated >01 >02 mL/min      Comment: (NOTE) Calculated using the CKD-EPI Creatinine Equation (2021)      Anion gap 13 5 - 15      Comment: Performed at [facility], [address] [place], [place] [address]  Magnesium     Status: None    Collection Time: 03/18/21  6:03 AM  Result  Value Ref Range    Magnesium 2.3 1.7 - 2.4 mg/dL      Comment: Performed at [facility], [address], [place], [place] [address]  CBC     Status: Abnormal    Collection Time: 03/18/21  6:03 AM  Result Value Ref Range    WBC 10.1 4.0 - 10.5 K/uL    RBC 4.28 4.22 - 5.81 MIL/uL    Hemoglobin 13.4 13.0 - 17.0 g/dL    HCT 35.5 (L) 73.2 - 52.0 %    MCV 89.7 80.0 - 100.0 fL    MCH 31.3 26.0 - 34.0 pg    MCHC 34.9 30.0 - 36.0 g/dL    RDW 20.2 54.2 - 70.6 %    Platelets 230 150 - 400 K/uL    nRBC 0.0 0.0 - 0.2 %      Comment: Performed at [facility], [address] [place], [place] [address]       Imaging Results (Last 48  hours)  DG Swallowing Func-Speech Pathology   Result Date: 03/17/2021 Formatting of this result is different from the original. Objective Swallowing Evaluation: Type of Study: MBS-Modified Barium Swallow Study  Patient Details Name: Ivie Maese MRN: [id number] Date of Birth: Aug 14, 1981 Today's Date: 03/17/2021 Time: SLP Start Time (ACUTE ONLY): 1230 -SLP Stop Time (ACUTE ONLY): 1300 SLP Time Calculation (min) (ACUTE ONLY): 30 min Past Medical History: Past Medical History: Diagnosis Date  Brain aneurysm  Past Surgical History: Past Surgical History: Procedure Laterality Date  IR 3D INDEPENDENT WKST  03/11/2021  IR ANGIO INTRA EXTRACRAN SEL COM CAROTID INNOMINATE BILAT MOD SED  03/11/2021  IR ANGIO VERTEBRAL SEL VERTEBRAL BILAT MOD SED  03/11/2021  RADIOLOGY WITH ANESTHESIA N/A 03/11/2021  Procedure: IR WITH ANESTHESIA - DIAGNOSTIC CEREBRAL ANGIOGRAM;  Surgeon: Lisbeth Renshaw, MD;  Location: [facility] OR;  Service: Radiology;  Laterality: N/A;  VENTRICULOPERITONEAL SHUNT   HPI: Pt is a 40 yo male who presented to [facility] ED with AMS and unilateral R-sided weakness after a syncopal event at home (heard by wife, found patient on floor). Code Stroke was initiated on arrival to ED.  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 S/p R frontal ventriculostomy 6/18. Pt intubated 6/18-24.  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 CXR 6/23 concerning for RLL infiltrate.  Cortrak placed 6/24.  Subjective: alert and participatory Assessment / Plan / Recommendation CHL IP CLINICAL IMPRESSIONS 03/17/2021 Clinical Impression Pt presents with excellent oropharyngeal swallow function with adequate mastication, timely pharyngeal swallow response, reliable laryngeal vestibule closure with no penetration nor aspiration, no residue post-swallow.  Recommend initiating a regular diet; thin liquids. Pt did have difficulty with manipulating 13 mm barium pill - (give whole with puree) -  and difficulty following some instructions for participation.  SLP will follow-up x1 to ensure safety; otherwise will follow for cognitive-communication. SLP Visit Diagnosis Dysphagia, unspecified (R13.10) Attention and concentration deficit following -- Frontal lobe and executive function deficit following -- Impact on safety and function No limitations   CHL IP TREATMENT RECOMMENDATION 03/17/2021 Treatment Recommendations Therapy as outlined in treatment plan below   Prognosis 03/15/2021 Prognosis for Safe Diet Advancement (No Data) Barriers to Reach Goals -- Barriers/Prognosis Comment -- CHL IP DIET RECOMMENDATION 03/17/2021 SLP Diet Recommendations Regular solids;Thin liquid Liquid Administration via Cup;Straw Medication Administration Whole meds with puree Compensations Minimize environmental distractions Postural Changes --   CHL IP OTHER RECOMMENDATIONS 03/17/2021 Recommended Consults --  Oral Care Recommendations Oral care BID Other Recommendations --   CHL IP FOLLOW UP RECOMMENDATIONS 03/17/2021 Follow up Recommendations Inpatient Rehab   CHL IP FREQUENCY AND DURATION 03/17/2021 Speech Therapy Frequency (ACUTE ONLY) min 1 x/week Treatment Duration 1 week      CHL IP ORAL PHASE 03/17/2021 Oral Phase WFL Oral - Pudding Teaspoon -- Oral - Pudding Cup -- Oral - Honey Teaspoon -- Oral - Honey Cup -- Oral - Nectar Teaspoon -- Oral - Nectar Cup -- Oral - Nectar Straw -- Oral - Thin Teaspoon -- Oral - Thin Cup -- Oral - Thin Straw -- Oral - Puree -- Oral - Mech Soft -- Oral - Regular -- Oral - Multi-Consistency -- Oral - Pill -- Oral Phase - Comment --  CHL IP PHARYNGEAL PHASE 03/17/2021 Pharyngeal Phase WFL Pharyngeal- Pudding Teaspoon -- Pharyngeal -- Pharyngeal- Pudding Cup -- Pharyngeal -- Pharyngeal- Honey Teaspoon -- Pharyngeal -- Pharyngeal- Honey Cup -- Pharyngeal -- Pharyngeal- Nectar Teaspoon -- Pharyngeal -- Pharyngeal- Nectar Cup -- Pharyngeal -- Pharyngeal- Nectar Straw -- Pharyngeal -- Pharyngeal- Thin  Teaspoon -- Pharyngeal -- Pharyngeal- Thin Cup -- Pharyngeal -- Pharyngeal- Thin Straw -- Pharyngeal -- Pharyngeal- Puree -- Pharyngeal -- Pharyngeal- Mechanical Soft -- Pharyngeal -- Pharyngeal- Regular -- Pharyngeal -- Pharyngeal- Multi-consistency -- Pharyngeal -- Pharyngeal- Pill -- Pharyngeal -- Pharyngeal Comment --  CHL IP CERVICAL ESOPHAGEAL PHASE 03/17/2021 Cervical Esophageal Phase WFL Pudding Teaspoon -- Pudding Cup -- Honey Teaspoon -- Honey Cup -- Nectar Teaspoon -- Nectar Cup -- Nectar Straw -- Thin Teaspoon -- Thin Cup -- Thin Straw -- Puree -- Mechanical Soft -- Regular -- Multi-consistency -- Pill -- Cervical Esophageal Comment -- Blenda MountsCouture, Amanda Laurice 03/17/2021, 1:10 PM                        Medical Problem List and Plan: 1.  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  Status post EVD which was removed 03/13/2021             -patient may  shower             -ELOS/Goals: 21-24d 2.  Antithrombotics: Subcutaneous heparin initiated 03/10/2021.  No plan for long-term anticoagulation -DVT/anticoagulation: Superficial vein thrombosis right upper extremity involving right cephalic vein age indeterminant DVT right axillary vein.  Lower extremity Dopplers negative             -antiplatelet therapy: N/A 3. Pain Management: Tylenol as needed 4. Mood: Provide emotional support             -antipsychotic agents: N/A 5. Neuropsych: This patient is not capable of making decisions on his own behalf. 6. Skin/Wound Care: Routine skin checks 7. Fluids/Electrolytes/Nutrition: Routine in and outs with follow-up chemistries 8.  Acute hypoxic respiratory failure.  Extubated 03/13/2021 9.  MSSA trach aspirate and group C strep.  7-day course antibiotics completed. 10.  Incidental findings COVID-19.  Airborne precautions completed 03/16/2021 11.  History of tobacco use.   Counseling      Charlton AmorDaniel J Angiulli, PA-C 03/19/2021  "I have personally performed a face to face diagnostic evaluation of this patient.  Additionally, I have reviewed and concur with the physician assistant's documentation above." Erick ColaceAndrew E. Johny Pitstick M.D. [facility] Fellow [employer] Diplomate Am Board of Electrodiagnostic Med Fellow Am Board of Interventional Pain

## 2021-03-19 NOTE — PMR Pre-admission (Signed)
PMR Admission Coordinator Pre-Admission Assessment  Patient: Casey Branch is an 40 y.o., male MRN: [id number] DOB: [birth year] Height: 5' 10"  (177.8 cm) Weight: 78 kg  Insurance Information HMO:     PPO:      PCP:      IPA:      80/20:      OTHER:  PRIMARY: Uninsured Development worker, community: [facility]      Phone#:   The "Data Collection Information Summary" for patients in Inpatient Rehabilitation Facilities with attached "Privacy Act [place] Records" was provided and verbally reviewed with: Family  Emergency Contact Information Contact Information     Name Relation Home Work Mobile   Redder,Sierra Spouse   [phone]   [place]  [phone]       Current Medical History  Patient Admitting Diagnosis: [place] History of Present Illness: [name] is a 40 year old right-handed male with past medical history significant for tobacco use, 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  Per chart review lives with spouse independent prior to admission.  Presented 03/08/2021 with altered mental status and right-sided weakness, with slurred speech nausea and vomiting of acute onset with left gaze preference.  There was a reported unwitnessed fall.  He was intubated for airway protection.  Admission chemistries unremarkable except potassium 3.4, glucose 138, hemoglobin 14.6, SARS coronavirus positive.  Cranial CT scan showed large volume intraventricular hemorrhage primarily in the left lateral ventricle but also in the right ventricle and third ventricle.  Increased density and mild increase in thickness in the tentorium bilaterally possibly with layering of blood.  Mild hydrocephalus.  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  No cerebral aneurysm.  He was initially placed on  3% hypertonic saline.  Echocardiogram with ejection fraction of 50 to 55% no wall motion abnormalities.  Patient underwent right frontal ventriculostomy 03/08/2021 per Dr. Venetia Constable followed by diagnostic cerebral angiogram per Dr. Kathyrn Sheriff findings of [name] grade 4 left thalamic AVM.Marland Kitchen  Patient was extubated 03/13/2021.  Venous Doppler right upper extremity showed a small acute superficial vein thrombosis involving the right cephalic vein and age-indeterminate DVT right axillary vein.  Patient was cleared to begin subcutaneous heparin for DVT prophylaxis and would not need long-term anticoagulation.  Venous Doppler studies negative.  Patient did complete a 7-day course of antibiotics for MSSA trach aspirate and group C strep.  In regards to patient's incidental finding of COVID patient was treated airborne precautions until 03/16/2021 and discontinued.  Patient did initially have bouts of agitation restlessness maintained on low-dose Precedex.  Maintained on a regular consistency diet.  Therapy evaluations completed due to patient decreased functional mobility was admitted for a comprehensive rehab program. Complete NIHSS TOTAL: 10  Patient's medical record from [facility] has been reviewed by the rehabilitation admission coordinator and physician.  Past Medical History  Past Medical History:  Diagnosis Date   Brain aneurysm     Family History   family history is not on file.  Prior Rehab/Hospitalizations Has the patient had prior rehab or hospitalizations prior to admission? No  Has the patient had major surgery during 100 days prior to admission? Yes   Current Medications  Current Facility-Administered Medications:    0.9 %  sodium chloride infusion, , Intravenous, Continuous, Ostergard, Joyice Faster, MD, Stopped at 03/17/21 0615   chlorhexidine (PERIDEX) 0.12 %  solution 15 mL, 15 mL, Mouth Rinse, BID, Ostergard, Joyice Faster, MD, 15 mL at 03/18/21 2153   Chlorhexidine  Gluconate Cloth 2 % PADS 6 each, 6 each, Topical, Q0600, Judith Part, MD, 6 each at 03/19/21 0626   feeding supplement (ENSURE ENLIVE / ENSURE PLUS) liquid 237 mL, 237 mL, Oral, BID BM, Ostergard, Thomas A, MD   heparin injection 5,000 Units, 5,000 Units, Subcutaneous, Q8H, Ostergard, Joyice Faster, MD, 5,000 Units at 03/19/21 3557   ipratropium-albuterol (DUONEB) 0.5-2.5 (3) MG/3ML nebulizer solution 3 mL, 3 mL, Nebulization, Q6H PRN, Judith Part, MD, 3 mL at 03/12/21 1956   labetalol (NORMODYNE) injection 5-20 mg, 5-20 mg, Intravenous, Q30 min PRN, Ashok Pall, MD, 10 mg at 03/13/21 2012   MEDLINE mouth rinse, 15 mL, Mouth Rinse, q12n4p, Ostergard, Thomas A, MD, 15 mL at 03/18/21 1721   polyvinyl alcohol (LIQUIFILM TEARS) 1.4 % ophthalmic solution 1 drop, 1 drop, Both Eyes, Q12H, Lafayette Dragon, MD, 1 drop at 03/17/21 2247  Patients Current Diet:  Diet Order             Diet - low sodium heart healthy           Diet regular Room service appropriate? Yes; Fluid consistency: Thin  Diet effective now                   Precautions / Restrictions Precautions Precautions: Fall Precaution Comments: posey, cortrak, R inattention Restrictions Weight Bearing Restrictions: No   Has the patient had 2 or more falls or a fall with injury in the past year? No  Prior Activity Level Community (5-7x/wk): Pt. active in the community PTA  Prior Functional Level Self Care: Did the patient need help bathing, dressing, using the toilet or eating? Independent  Indoor Mobility: Did the patient need assistance with walking from room to room (with or without device)? Independent  Stairs: Did the patient need assistance with internal or external stairs (with or without device)? Independent  Functional Cognition: Did the patient need help planning regular tasks such as shopping or remembering to take medications? Independent  Home Assistive Devices / Equipment    Prior Device Use:  Indicate devices/aids used by the patient prior to current illness, exacerbation or injury? None of the above  Current Functional Level Cognition  Arousal/Alertness: Awake/alert Overall Cognitive Status: Impaired/Different from baseline Current Attention Level: Sustained Orientation Level: Oriented to person, Oriented to place Following Commands: Follows one step commands with increased time Safety/Judgement: Decreased awareness of safety, Decreased awareness of deficits General Comments: Demonstrates slow processing speeds, but able to follow 1 step commands with increased time. Attention: Sustained Sustained Attention: Impaired Sustained Attention Impairment: Verbal basic Memory: Impaired Memory Impairment: Storage deficit Awareness: Impaired Awareness Impairment: Intellectual impairment Safety/Judgment: Impaired    Extremity Assessment (includes Sensation/Coordination)  Upper Extremity Assessment: RUE deficits/detail RUE Deficits / Details: activation with sock task and attempting to open hand to hold sock. pt needs increased cues to initiate shoulder activation for flexion RUE Sensation: decreased light touch RUE Coordination: decreased fine motor, decreased gross motor LUE Deficits / Details: Pt noted with spontaneous movement prior to therapist's entrance, but did not actively move Lt UE during eval. PROM grossly [facility]  Lower Extremity Assessment: RLE deficits/detail, LLE deficits/detail RLE Deficits / Details: Pt noted with spontaneous movement prior to therapist's entrance, and able to pull lower extremity away from noxious stimuli with good knee and hip flexion AROM while supine in bed RLE Sensation:  (unable to  formally assess, detects noxious stimuli distally) RLE Coordination: decreased gross motor LLE Deficits / Details: Pt noted with spontaneous movement prior to therapist's entrance, and able to pull lower extremity away from noxious stimuli but did not achieve as full  knee and hip flexion AROM supine in bed as he did on L LLE Sensation:  (unable to formally assess, detects noxious stimuli distally) LLE Coordination: decreased gross motor    ADLs  Overall ADL's : Needs assistance/impaired Eating/Feeding: NPO Lower Body Dressing: Maximal assistance, Bed level Lower Body Dressing Details (indicate cue type and reason): pt opening hands and correctly idenifies sock as a sock.Pt initiated pull up sock with backward chaining of sock on foot by OT Toilet Transfer: +2 for physical assistance, Minimal assistance Toileting - Clothing Manipulation Details (indicate cue type and reason): incontinence supine and no awareness General ADL Comments: pt motivated to be up in chair this session    Mobility  Overal bed mobility: Needs Assistance Bed Mobility: Supine to Sit Rolling: Min assist Supine to sit: Mod assist, +2 for safety/equipment Sit to supine: Total assist, +2 for physical assistance, +2 for safety/equipment, HOB elevated General bed mobility comments: Exiting towards right side of bed with increased difficulty (likely due to decreased R sided attention), initiation and assist for BLE's off edge of bed and pt able to manage trunk to upright with guidance    Transfers  Overall transfer level: Needs assistance Equipment used: None Transfers: Sit to/from Stand Sit to Stand: Mod assist, +2 safety/equipment Stand pivot transfers: Min assist, +2 physical assistance General transfer comment: ModA to stand, R knee blocked, multimodal cues for wider BOS    Ambulation / Gait / Stairs / Wheelchair Mobility  Ambulation/Gait Ambulation/Gait assistance: Mod assist, Max assist, +2 safety/equipment, +2 physical assistance Gait Distance (Feet): 25 Feet Assistive device: Rolling walker (2 wheeled) Gait Pattern/deviations: Step-through pattern, Decreased dorsiflexion - right, Leaning posteriorly, Narrow base of support, Decreased step length - right, Decreased stance  time - right General Gait Details: Pt requiring modA consistently for balance and close chair follow. Cues for wider BOS, midline positioning (tendency for posterior and right lateral lean), stepping initiation and larger right step length. Pt with one posterior LOB requiring maxA to correct and assisted back into chair Gait velocity: decreased Gait velocity interpretation: <1.8 ft/sec, indicate of risk for recurrent falls    Posture / Balance Dynamic Sitting Balance Sitting balance - Comments: Requiring minA, intermittent posterior and left lateral lean Balance Overall balance assessment: Needs assistance Sitting-balance support: No upper extremity supported, Feet supported Sitting balance-Leahy Scale: Poor Sitting balance - Comments: Requiring minA, intermittent posterior and left lateral lean Postural control: Left lateral lean, Posterior lean Standing balance support: Bilateral upper extremity supported, During functional activity Standing balance-Leahy Scale: Poor Standing balance comment: reliant on external support    Special needs/care consideration Skin Surgical incision on head and Special service needs [place]rak   Previous Home Environment (from acute therapy documentation) Living Arrangements: Spouse/significant other  Lives With: Spouse, Family Available Help at Discharge: Family, Available 24 hours/day Type of Home: House Home Layout: One level Home Access: Level entry Bathroom Shower/Tub: Tub only, Tub/shower unit, Multimedia programmer: Standard Bathroom Accessibility: Yes How Accessible: Accessible via walker, Accessible via wheelchair Home Care Services: Yes Additional Comments: Pt unable to provide info and no family present  Discharge Living Setting Plans for Discharge Living Setting: Patient's home Type of Home at Discharge: House Discharge Home Layout: One level Discharge Home Access: Level entry Discharge  Bathroom Shower/Tub: Tub/shower unit, Tub  only, Horticulturist, commercial: Standard Discharge Bathroom Accessibility: Yes How Accessible: Accessible via wheelchair, Accessible via walker Does the patient have any problems obtaining your medications?: No  Social/Family/Support Systems Patient Roles: Spouse Contact Information: [phone] Anticipated Caregiver: Deshazer,Sierra Anticipated Caregiver's Contact Information: [phone] Ability/Limitations of Caregiver: Can provide Min A Caregiver Availability: 24/7 Discharge Plan Discussed with Primary Caregiver: Yes Is Caregiver In Agreement with Plan?: Yes Does Caregiver/Family have Issues with Lodging/Transportation while Pt is in Rehab?: No  Goals Patient/Family Goal for Rehab: PT/OT/SLP Min A Expected length of stay: 21-24 days Pt/Family Agrees to Admission and willing to participate: Yes Program Orientation Provided & Reviewed with Pt/Caregiver Including Roles  & Responsibilities: Yes  Decrease burden of Care through IP rehab admission: Specialzed equipment needs, Decrease number of caregivers, Bowel and bladder program, and Patient/family education  Possible need for SNF placement upon discharge: not anticipated   Patient Condition: I have reviewed medical records from [facility], spoken with CM, and patient and spouse. I met with patient at the bedside and discussed via phone for inpatient rehabilitation assessment.  Patient will benefit from ongoing PT, OT, and SLP, can actively participate in 3 hours of therapy a day 5 days of the week, and can make measurable gains during the admission.  Patient will also benefit from the coordinated team approach during an Inpatient Acute Rehabilitation admission.  The patient will receive intensive therapy as well as Rehabilitation physician, nursing, social worker, and care management interventions.  Due to bladder management, bowel management, safety, skin/wound care, disease management, medication  administration, pain management, and patient education the patient requires 24 hour a day rehabilitation nursing.  The patient is currently Mod-Max A with mobility and basic ADLs.  Discharge setting and therapy post discharge at home with home health is anticipated.  Patient has agreed to participate in the Acute Inpatient Rehabilitation Program and will admit today.  Preadmission Screen Completed By:  Genella Mech, 03/19/2021 7:56 AM ______________________________________________________________________   Discussed status with Dr. Bonney Aid on 03/19/21 at 1000 and received approval for admission today.  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 Yes Co-Morbidities requiring supervision/potential complications: recent PNA with VDRF Due to bladder management, bowel management, safety, skin/wound care, disease management, medication administration, pain management, and patient education, does the patient require 24 hr/day rehab nursing? Yes Does the patient require coordinated care of a physician, rehab nurse, PT, OT, and SLP to address physical and functional deficits in the context of the above medical diagnosis(es)? Yes Addressing deficits in the following areas: balance, endurance, locomotion, strength, transferring, bowel/bladder control, bathing, dressing, feeding, grooming, toileting, cognition, speech, language, swallowing, and psychosocial support Can the patient actively participate in an intensive therapy program of at least 3 hrs of therapy 5 days a week? Yes The potential for patient to make measurable gains while on inpatient rehab is good Anticipated functional outcomes upon discharge from inpatient rehab: supervision PT, supervision OT,  supervision SLP Estimated rehab length of stay to reach the above functional goals is: 21-24d Anticipated discharge destination: Home 10. Overall Rehab/Functional Prognosis: good   MD Signature: Charlett Blake M.D. [place] Diplomate Am Board of Electrodiagnostic  Med Fellow Am Board of Interventional Pain

## 2021-03-19 NOTE — Discharge Summary (Signed)
Discharge Summary  Date of Admission: 03/08/2021  Date of Discharge: 03/19/21  Attending Physician: Autumn Patty, MD  Hospital Course: Patient was admitted with confusion and obtundation. He was intubated on arrival to the ED. CTH showed L thalamic hemorrhage. Per family, had a prior hemorrhage in a similar location w/ VPS placement, but didn't f/u for treatment of his AVM. CTA suggested a L thalamic AVM, he developed hydrocephalus and a R F EVD was placed on 6/19. On 6/21, a catheter angio showed a SM grade 4 AVM. He passed an EVD clamp trial and his EVD was removed on 6/23. He had suspected aspiration PNA that was Tx'd w/ antibiotics and he was extubated on  6/22 EVD clamp trial, 6/23 passed trial & EVD removed, then he was extubated on 6/23. PT/OT recommended CIR,  his hospital course was otherwise uncomplicated and the patient was discharged to CIR on 03/19/21. He will follow up in clinic with me in 2 weeks.  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

## 2021-03-19 NOTE — Progress Notes (Signed)
PMR Admission Coordinator Pre-Admission Assessment   Patient: Casey Branch is an 40 y.o., male MRN: [id number] DOB: Jan 27, 1981 Height: 5' 10"  (177.8 cm) Weight: 78 kg   Insurance Information HMO:     PPO:      PCP:      IPA:      80/20:      OTHER: PRIMARY: Uninsured Development worker, community: [facility]      Phone#:   The "Data Collection Information Summary" for patients in Inpatient Rehabilitation Facilities with attached "Privacy Act [place] Records" was provided and verbally reviewed with: Family   Emergency Contact Information Contact Information       Name Relation Home Work Mobile    Branch,Casey Spouse     [phone]    [place]   [phone]           Current Medical History  Patient Admitting Diagnosis: [place] History of Present Illness: Casey Branch is a 40 year old right-handed male with past medical history significant for tobacco use, 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  Per chart review lives with spouse independent prior to admission.  Presented 03/08/2021 with altered mental status and right-sided weakness, with slurred speech nausea and vomiting of acute onset with left gaze preference.  There was a reported unwitnessed fall.  He was intubated for airway protection.  Admission chemistries unremarkable except potassium 3.4, glucose 138, hemoglobin 14.6, SARS coronavirus positive.  Cranial CT scan showed large volume intraventricular hemorrhage primarily in the left lateral ventricle but also in the right ventricle and third ventricle.  Increased density and mild increase in thickness in the tentorium bilaterally possibly with layering of blood.  Mild hydrocephalus.  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  No cerebral aneurysm.  He was  initially placed on 3% hypertonic saline.  Echocardiogram with ejection fraction of 50 to 55% no wall motion abnormalities.  Patient underwent right frontal ventriculostomy 03/08/2021 per Dr. Venetia Constable followed by diagnostic cerebral angiogram per Dr. Kathyrn Sheriff findings of Casey Branch grade 4 left thalamic AVM.Marland Kitchen  Patient was extubated 03/13/2021.  Venous Doppler right upper extremity showed a small acute superficial vein thrombosis involving the right cephalic vein and age-indeterminate DVT right axillary vein.  Patient was cleared to begin subcutaneous heparin for DVT prophylaxis and would not need long-term anticoagulation.  Venous Doppler studies negative.  Patient did complete a 7-day course of antibiotics for MSSA trach aspirate and group C strep.  In regards to patient's incidental finding of COVID patient was treated airborne precautions until 03/16/2021 and discontinued.  Patient did initially have bouts of agitation restlessness maintained on low-dose Precedex.  Maintained on a regular consistency diet.  Therapy evaluations completed due to patient decreased functional mobility was admitted for a comprehensive rehab program. Complete NIHSS TOTAL: 10   Patient's medical record from [facility] has been reviewed by the rehabilitation admission coordinator and physician.   Past Medical History      Past Medical History:  Diagnosis Date   Brain aneurysm        Family History   family history is not on file.   Prior Rehab/Hospitalizations Has the patient had prior rehab or hospitalizations prior to admission? No   Has the patient had major surgery during 100 days prior to admission? Yes  Current Medications   Current Facility-Administered Medications:   0.9 %  sodium chloride infusion, , Intravenous, Continuous, Ostergard, Joyice Faster, MD, Stopped at 03/17/21 0615   chlorhexidine (PERIDEX) 0.12 % solution 15 mL, 15 mL, Mouth Rinse, BID, Ostergard, Joyice Faster, MD,  15 mL at 03/18/21 2153   Chlorhexidine Gluconate Cloth 2 % PADS 6 each, 6 each, Topical, Q0600, Judith Part, MD, 6 each at 03/19/21 0626   feeding supplement (ENSURE ENLIVE / ENSURE PLUS) liquid 237 mL, 237 mL, Oral, BID BM, Ostergard, Thomas A, MD   heparin injection 5,000 Units, 5,000 Units, Subcutaneous, Q8H, Ostergard, Joyice Faster, MD, 5,000 Units at 03/19/21 0768   ipratropium-albuterol (DUONEB) 0.5-2.5 (3) MG/3ML nebulizer solution 3 mL, 3 mL, Nebulization, Q6H PRN, Judith Part, MD, 3 mL at 03/12/21 1956   labetalol (NORMODYNE) injection 5-20 mg, 5-20 mg, Intravenous, Q30 min PRN, Ashok Pall, MD, 10 mg at 03/13/21 2012   MEDLINE mouth rinse, 15 mL, Mouth Rinse, q12n4p, Ostergard, Thomas A, MD, 15 mL at 03/18/21 1721   polyvinyl alcohol (LIQUIFILM TEARS) 1.4 % ophthalmic solution 1 drop, 1 drop, Both Eyes, Q12H, Lafayette Dragon, MD, 1 drop at 03/17/21 2247   Patients Current Diet:  Diet Order                  Diet - low sodium heart healthy             Diet regular Room service appropriate? Yes; Fluid consistency: Thin  Diet effective now                         Precautions / Restrictions Precautions Precautions: Fall Precaution Comments: posey, cortrak, R inattention Restrictions Weight Bearing Restrictions: No    Has the patient had 2 or more falls or a fall with injury in the past year? No   Prior Activity Level Community (5-7x/wk): Pt. active in the community PTA   Prior Functional Level Self Care: Did the patient need help bathing, dressing, using the toilet or eating? Independent   Indoor Mobility: Did the patient need assistance with walking from room to room (with or without device)? Independent   Stairs: Did the patient need assistance with internal or external stairs (with or without device)? Independent   Functional Cognition: Did the patient need help planning regular tasks such as shopping or remembering to take medications? Independent    Home Assistive Devices / Equipment   Prior Device Use: Indicate devices/aids used by the patient prior to current illness, exacerbation or injury? None of the above   Current Functional Level Cognition   Arousal/Alertness: Awake/alert Overall Cognitive Status: Impaired/Different from baseline Current Attention Level: Sustained Orientation Level: Oriented to person, Oriented to place Following Commands: Follows one step commands with increased time Safety/Judgement: Decreased awareness of safety, Decreased awareness of deficits General Comments: Demonstrates slow processing speeds, but able to follow 1 step commands with increased time. Attention: Sustained Sustained Attention: Impaired Sustained Attention Impairment: Verbal basic Memory: Impaired Memory Impairment: Storage deficit Awareness: Impaired Awareness Impairment: Intellectual impairment Safety/Judgment: Impaired    Extremity Assessment (includes Sensation/Coordination)   Upper Extremity Assessment: RUE deficits/detail RUE Deficits / Details: activation with sock task and attempting to open hand to hold sock. pt needs increased cues to initiate shoulder activation for flexion RUE Sensation: decreased light touch RUE Coordination: decreased fine motor, decreased gross motor LUE Deficits / Details: Pt noted with spontaneous movement prior to therapist's entrance, but did not actively  move Lt UE during eval. PROM grossly WFL  Lower Extremity Assessment: RLE deficits/detail, LLE deficits/detail RLE Deficits / Details: Pt noted with spontaneous movement prior to therapist's entrance, and able to pull lower extremity away from noxious stimuli with good knee and hip flexion AROM while supine in bed RLE Sensation:  (unable to formally assess, detects noxious stimuli distally) RLE Coordination: decreased gross motor LLE Deficits / Details: Pt noted with spontaneous movement prior to therapist's entrance, and able to pull lower  extremity away from noxious stimuli but did not achieve as full knee and hip flexion AROM supine in bed as he did on L LLE Sensation:  (unable to formally assess, detects noxious stimuli distally) LLE Coordination: decreased gross motor     ADLs   Overall ADL's : Needs assistance/impaired Eating/Feeding: NPO Lower Body Dressing: Maximal assistance, Bed level Lower Body Dressing Details (indicate cue type and reason): pt opening hands and correctly idenifies sock as a sock.Pt initiated pull up sock with backward chaining of sock on foot by OT Toilet Transfer: +2 for physical assistance, Minimal assistance Toileting - Clothing Manipulation Details (indicate cue type and reason): incontinence supine and no awareness General ADL Comments: pt motivated to be up in chair this session     Mobility   Overal bed mobility: Needs Assistance Bed Mobility: Supine to Sit Rolling: Min assist Supine to sit: Mod assist, +2 for safety/equipment Sit to supine: Total assist, +2 for physical assistance, +2 for safety/equipment, HOB elevated General bed mobility comments: Exiting towards right side of bed with increased difficulty (likely due to decreased R sided attention), initiation and assist for BLE's off edge of bed and pt able to manage trunk to upright with guidance     Transfers   Overall transfer level: Needs assistance Equipment used: None Transfers: Sit to/from Stand Sit to Stand: Mod assist, +2 safety/equipment Stand pivot transfers: Min assist, +2 physical assistance General transfer comment: ModA to stand, R knee blocked, multimodal cues for wider BOS     Ambulation / Gait / Stairs / Wheelchair Mobility   Ambulation/Gait Ambulation/Gait assistance: Mod assist, Max assist, +2 safety/equipment, +2 physical assistance Gait Distance (Feet): 25 Feet Assistive device: Rolling walker (2 wheeled) Gait Pattern/deviations: Step-through pattern, Decreased dorsiflexion - right, Leaning posteriorly,  Narrow base of support, Decreased step length - right, Decreased stance time - right General Gait Details: Pt requiring modA consistently for balance and close chair follow. Cues for wider BOS, midline positioning (tendency for posterior and right lateral lean), stepping initiation and larger right step length. Pt with one posterior LOB requiring maxA to correct and assisted back into chair Gait velocity: decreased Gait velocity interpretation: <1.8 ft/sec, indicate of risk for recurrent falls     Posture / Balance Dynamic Sitting Balance Sitting balance - Comments: Requiring minA, intermittent posterior and left lateral lean Balance Overall balance assessment: Needs assistance Sitting-balance support: No upper extremity supported, Feet supported Sitting balance-Leahy Scale: Poor Sitting balance - Comments: Requiring minA, intermittent posterior and left lateral lean Postural control: Left lateral lean, Posterior lean Standing balance support: Bilateral upper extremity supported, During functional activity Standing balance-Leahy Scale: Poor Standing balance comment: reliant on external support     Special needs/care consideration Skin Surgical incision on head and Special service needs C[place]rak    Previous Home Environment (from acute therapy documentation) Living Arrangements: Spouse/significant other  Lives With: Spouse, Family Available Help at Discharge: Family, Available 24 hours/day Type of Home: House Home Layout: One level Home Access: Level  entry Bathroom Shower/Tub: Tub only, Tub/shower unit, Multimedia programmer: Standard Bathroom Accessibility: Yes How Accessible: Accessible via walker, Accessible via wheelchair [place]: Yes Additional Comments: Pt unable to provide info and no family present   Discharge Living Setting Plans for Discharge Living Setting: Patient's home Type of Home at Discharge: House Discharge Home Layout: One level Discharge  Home Access: Level entry Discharge Bathroom Shower/Tub: Tub/shower unit, Tub only, Walk-in shower Discharge Bathroom Toilet: Standard Discharge Bathroom Accessibility: Yes How Accessible: Accessible via wheelchair, Accessible via walker Does the patient have any problems obtaining your medications?: No   Social/Family/Support Systems Patient Roles: Spouse Contact Information: [phone] Anticipated Caregiver: Luckenbach,Casey Anticipated Caregiver's Contact Information: [phone] Ability/Limitations of Caregiver: Can provide Min A Caregiver Availability: 24/7 Discharge Plan Discussed with Primary Caregiver: Yes Is Caregiver In Agreement with Plan?: Yes Does Caregiver/Family have Issues with Lodging/Transportation while Pt is in Rehab?: No   Goals Patient/Family Goal for Rehab: PT/OT/SLP Min A Expected length of stay: 21-24 days Pt/Family Agrees to Admission and willing to participate: Yes Program Orientation Provided & Reviewed with Pt/Caregiver Including Roles  & Responsibilities: Yes   Decrease burden of Care through IP rehab admission: Specialzed equipment needs, Decrease number of caregivers, Bowel and bladder program, and Patient/family education   Possible need for SNF placement upon discharge: not anticipated    Patient Condition: I have reviewed medical records from [facility], spoken with CM, and patient and spouse. I met with patient at the bedside and discussed via phone for inpatient rehabilitation assessment.  Patient will benefit from ongoing PT, OT, and SLP, can actively participate in 3 hours of therapy a day 5 days of the week, and can make measurable gains during the admission.  Patient will also benefit from the coordinated team approach during an Inpatient Acute Rehabilitation admission.  The patient will receive intensive therapy as well as Rehabilitation physician, nursing, social worker, and care management interventions.  Due to bladder  management, bowel management, safety, skin/wound care, disease management, medication administration, pain management, and patient education the patient requires 24 hour a day rehabilitation nursing.  The patient is currently Mod-Max A with mobility and basic ADLs.  Discharge setting and therapy post discharge at home with home health is anticipated.  Patient has agreed to participate in the Acute Inpatient Rehabilitation Program and will admit today.   Preadmission Screen Completed By:  Genella Mech, 03/19/2021 7:56 AM ______________________________________________________________________   Discussed status with Dr. Bonney Aid on 03/19/21 at 1000 and received approval for admission today.   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 Yes Co-Morbidities requiring supervision/potential complications: recent PNA with VDRF Due to bladder management, bowel management, safety, skin/wound care, disease management, medication administration, pain management, and patient education, does the patient require 24 hr/day rehab nursing? Yes Does the patient require coordinated care of a physician, rehab nurse, PT, OT, and SLP to address physical and functional deficits in the context of the above medical diagnosis(es)? Yes Addressing deficits in the following areas: balance, endurance, locomotion, strength, transferring, bowel/bladder control, bathing, dressing, feeding, grooming, toileting, cognition, speech, language, swallowing, and psychosocial support Can the patient actively participate in an intensive therapy program of at least 3 hrs of therapy 5 days a week? Yes The  potential for patient to make measurable gains while on inpatient rehab is good Anticipated  functional outcomes upon discharge from inpatient rehab: supervision PT, supervision OT, supervision SLP Estimated rehab length of stay to reach the above functional goals is: 21-24d Anticipated discharge destination: Home 10. Overall Rehab/Functional Prognosis: good     MD Signature: Charlett Blake M.D. [employer] Fellow Am Acad of Phys Med and Rehab Diplomate Am Board of Electrodiagnostic Med Fellow Am Board of Interventional Pain

## 2021-03-19 NOTE — Discharge Instructions (Signed)
Discharge Instructions  No restriction in activities, slowly increase your activity back to normal.   Okay to shower on the day of discharge. Be gentle when cleaning your incision. Use regular soap and water. If that is uncomfortable, try using baby shampoo. Do not submerge the wound under water for 2 weeks after surgery.  Follow up with Dr. Maurice Small in 2 weeks after discharge. If you do not already have a discharge appointment, please call his office at [phone] to schedule a follow up appointment. If you have any concerns or questions, please call the office and let us know.

## 2021-03-19 NOTE — Progress Notes (Signed)
Physical Therapy Treatment Patient Details Name: Casey Branch MRN: [id number] DOB: 10-16-1980 Today's Date: 03/19/2021    History of Present Illness Pt is a 40 y.o. male who presented 6/18 with a headache, confusion, then LOC with nausea and vomiting. 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 S/p R frontal ventriculostomy 6/18. Developed hydrocephalus s/p R F EVD 6/19. ETT 6/19 - 6/23. S/p diagnostic cerebral angiogram 6/21. EVD removed 6/23. Pt found to be COVID + on admission. + Rt UE DVT 6/24; ectopy and PVC 6/24 PMH: h/o prior intracranial hemorrhage with VPS.    PT Comments    Pt progressing well towards his physical therapy goals.Continues to be oriented to self only; follows 1 step commands with increased time and multiomodal cues. Pt requiring mod assist (+2) for functional mobility. Ambulating x 50 feet with a walker and close chair follow. Demonstrates visual deficits and right inattention, thus requiring food to be presented within his central line of sight to self feed. Continue to recommend comprehensive inpatient rehab (CIR) for post-acute therapy needs.   Follow Up Recommendations  CIR;Supervision/Assistance - 24 hour     Equipment Recommendations  Wheelchair (measurements PT);Wheelchair cushion (measurements PT);3in1 (PT);Rolling walker with 5" wheels    Recommendations for Other Services       Precautions / Restrictions Precautions Precautions: Fall Precaution Comments: posey, cortrak, R inattention Restrictions Weight Bearing Restrictions: No    Mobility  Bed Mobility Overal bed mobility: Needs Assistance Bed Mobility: Supine to Sit     Supine to sit: Mod assist     General bed mobility comments: Exiting towards right side of bed with increased difficulty (likely due to decreased R sided attention), initiation and assist for BLE's off edge of bed and pt able to manage trunk to upright with guidance. Assist to keep in sitting position as he  frequently tries to return to supine position.    Transfers Overall transfer level: Needs assistance Equipment used: None Transfers: Sit to/from Stand Sit to Stand: Mod assist         General transfer comment: ModA to rise from edge of bed, pt utilizing narrow BOS with initial posterior lean  Ambulation/Gait Ambulation/Gait assistance: Mod assist;+2 safety/equipment;+2 physical assistance Gait Distance (Feet): 50 Feet Assistive device: Rolling walker (2 wheeled) Gait Pattern/deviations: Step-through pattern;Decreased dorsiflexion - right;Leaning posteriorly;Narrow base of support;Decreased step length - right;Decreased stance time - right;Drifts right/left Gait velocity: decreased Gait velocity interpretation: <1.8 ft/sec, indicate of risk for recurrent falls General Gait Details: Pt requiring modA consistently for balance (+2) and close chair follow. Pt demonstrating right drift, right knee instability, posterior/right lateral lean. Cues provided for upward gaze, increased right step length, stepping initiation and forward momentum   Stairs             Wheelchair Mobility    Modified Rankin (Stroke Patients Only) Modified Rankin (Stroke Patients Only) Pre-Morbid Rankin Score: No symptoms Modified Rankin: Moderately severe disability     Balance Overall balance assessment: Needs assistance Sitting-balance support: No upper extremity supported;Feet supported Sitting balance-Leahy Scale: Poor Sitting balance - Comments: Requiring minA, intermittent posterior and left lateral lean Postural control: Left lateral lean;Posterior lean Standing balance support: Bilateral upper extremity supported;During functional activity Standing balance-Leahy Scale: Poor Standing balance comment: reliant on external support                            Cognition Arousal/Alertness: Awake/alert Behavior During Therapy: Flat affect Overall  Cognitive Status: Impaired/Different  from baseline Area of Impairment: Orientation;Attention;Memory;Following commands;Safety/judgement;Awareness;Problem solving                 Orientation Level: Disoriented to;Place;Time;Situation Current Attention Level: Sustained Memory: Decreased short-term memory Following Commands: Follows one step commands with increased time Safety/Judgement: Decreased awareness of safety;Decreased awareness of deficits Awareness: Intellectual Problem Solving: Difficulty sequencing;Requires verbal cues;Slow processing General Comments: Pt continues to be oriented to self only, stating he was "near his house." unable to correctly state location with options. Pt very pleasant and following 1 step commands with increased time and multimodal cues for initiation. When presented food within his intact visual field, he is able to self feed.      Exercises      General Comments        Pertinent Vitals/Pain Pain Assessment: No/denies pain    Home Living   Living Arrangements: Spouse/significant other Available Help at Discharge: Family;Available 24 hours/day Type of Home: House Home Access: Level entry   Home Layout: One level        Prior Function Level of Independence: Independent          PT Goals (current goals can now be found in the care plan section) Acute Rehab PT Goals Patient Stated Goal: did not state PT Goal Formulation: Patient unable to participate in goal setting Time For Goal Achievement: 03/28/21 Potential to Achieve Goals: Good Progress towards PT goals: Progressing toward goals    Frequency    Min 4X/week      PT Plan Current plan remains appropriate    Co-evaluation              AM-PAC PT "6 Clicks" Mobility   Outcome Measure  Help needed turning from your back to your side while in a flat bed without using bedrails?: A Little Help needed moving from lying on your back to sitting on the side of a flat bed without using bedrails?: A Lot Help  needed moving to and from a bed to a chair (including a wheelchair)?: A Lot Help needed standing up from a chair using your arms (e.g., wheelchair or bedside chair)?: A Lot Help needed to walk in hospital room?: Total Help needed climbing 3-5 steps with a railing? : Total 6 Click Score: 11    End of Session Equipment Utilized During Treatment: Gait belt Activity Tolerance: Patient tolerated treatment well Patient left: with call bell/phone within reach;in chair;with chair alarm set;with restraints reapplied Nurse Communication: Mobility status PT Visit Diagnosis: Muscle weakness (generalized) (M62.81);Difficulty in walking, not elsewhere classified (R26.2);Other symptoms and signs involving the nervous system (O37.858)     Time: 8502-7741 PT Time Calculation (min) (ACUTE ONLY): 19 min  Charges:  $Gait Training: 8-22 mins                     Lillia Pauls, PT, DPT Acute Rehabilitation Services Pager [phone] Office [phone]    Casey Branch 03/19/2021, 10:01 AM

## 2021-03-19 NOTE — Progress Notes (Signed)
INPATIENT REHABILITATION ADMISSION NOTE   Arrival Method: Wheelchair from ICU at 1600     Mental Orientation: A/O x 1   Assessment: Completed    Skin: Assessed   IV'S: Left wrist    Pain: None   Tubes and Drains: None   Safety Measures: Posey belt, Bed alarm on    Vital Signs: Done    Height and Weight: Done   Rehab Orientation: Done    Family: Notified     Notes:

## 2021-03-19 NOTE — TOC Transition Note (Signed)
Transition of Care [facility]) - CM/SW Discharge Note   Patient Details  Name: Casey Branch MRN: [id number] Date of Birth: August 23, 1981  Transition of Care [facility]) CM/SW Contact:  Glennon Mac, RN Phone Number: 03/19/2021, 11:45 AM   Clinical Narrative: Pt is a 40 y.o. male who presented 6/18 with a headache, confusion, then LOC with nausea and vomiting. 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 S/p R frontal ventriculostomy 6/18. Developed hydrocephalus s/p R F EVD 6/19.   Patient medically stable for discharge today, and has been accepted for admission to inpatient rehab today.  Plan dc to CIR when bed available.    Final next level of care: IP Rehab Facility Barriers to Discharge: Barriers Resolved   Patient Goals and CMS Choice   CMS Medicare.gov Compare Post Acute Care list provided to:: Patient Represenative (must comment) (spouse) Choice offered to / list presented to : Spouse  Discharge Placement                       Discharge Plan and Services   Discharge Planning Services: CM Consult Post Acute Care Choice: IP Rehab                               Social Determinants of Health (SDOH) Interventions     Readmission Risk Interventions No flowsheet data found.  Quintella Baton, RN, BSN  Trauma/Neuro ICU Case Manager [phone]

## 2021-03-19 NOTE — Progress Notes (Signed)
  Speech Language Pathology Treatment: Dysphagia;Cognitive-Linquistic  Patient Details Name: Casey Branch MRN: [id number] DOB: [birth year] Today's Date: 03/19/2021 Time: 5852-7782 SLP Time Calculation (min) (ACUTE ONLY): 19 min  Assessment / Plan / Recommendation Clinical Impression  Pt demonstrates more spontaneous communication, as well as improved response time.  Language today reveals clear aphasic qualities.  Output is fluent, marked by filler words and vague referents to various topics.  Confrontation naming tasks marked by perseveration without recognition (e.g., responding "foam cup" after target shifted to t.v. then phone). He was able to point to body parts with 75% accuracy; when left/right option added to body part,  accuracy decreased to 30%.  With modeling and imposed delay, accuracy improved marginally. He was unable to generate more than two items per category.    [name] has met swallowing goal and dysphagia has resolved.  He is safe to continue a regular diet, thin liquids.  He will benefit from ongoing SLP in CIR to address aphasia.    HPI HPI: Pt is a 40 yo male who presented to [facility] ED with AMS and unilateral R-sided weakness after a syncopal event at home (heard by wife, found patient on floor). Code Stroke was initiated on arrival to ED.  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 S/p R frontal ventriculostomy 6/18. Pt intubated 6/18-24.  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 CXR 6/23 concerning for RLL infiltrate.  Cortrak placed 6/24.      SLP Plan  Continue with current plan of care       Recommendations  Diet recommendations: Regular;Thin liquid Liquids provided via: Cup;Straw Medication Administration: Whole meds with puree Supervision: Patient able to self feed Compensations: Minimize environmental distractions Postural Changes and/or Swallow Maneuvers: Out of bed for meals                 Oral Care Recommendations: Oral care BID Follow up Recommendations: Inpatient Rehab SLP Visit Diagnosis: Dysphagia, unspecified (R13.10);Aphasia (R47.01) Plan: Continue with current plan of care       Casey Branch. Casey Branch, Casey Branch Office number [phone] Pager [phone]  Casey Branch 03/19/2021, 10:47 AM

## 2021-03-19 NOTE — Progress Notes (Signed)
Inpatient Rehabilitation Medication Review by a Pharmacist  A complete drug regimen review was completed for this patient to identify any potential clinically significant medication issues.  Clinically significant medication issues were identified:  no  Check AMION for pharmacist assigned to patient if future medication questions/issues arise during this admission.  Pharmacist comments:   Time spent performing this drug regimen review (minutes):  15   Haiven Nardone 03/19/2021 5:05 PM

## 2021-03-19 NOTE — Progress Notes (Signed)
Neurosurgery Service Progress Note  Subjective: NAE ON, no complaints this morning  Objective: Vitals:   03/19/21 0300 03/19/21 0400 03/19/21 0500 03/19/21 0600  BP:  112/77    Pulse: (!) 57 62 (!) 57   Resp: (!) 27 (!) 24 (!) 23 16  Temp:  97.8 F (36.6 C)    TempSrc:  Oral    SpO2: 97% 97% 98%   Weight:   78 kg   Height:        Physical Exam: Awake/alert, gaze conjugate but some nystagmus, PERRL, dec'd speech output but answers questions appropriately, Ox2, Fcx4 with 4 to 4+/5 on R, 5/5 on L  Assessment & Plan: 40 y.o. man w/ h/o L thalamic hemorrhage, VPS, then p/w repeat hemorrhage, CTA w/ L thalamic AVM, intubated after arrival for airway protection, developed hydrocephalus s/p R F EVD 6/19. 6/21 catheter angio w/ SM grade 4 AVM, 6/22 EVD clamp trial, 6/23 passed trial & EVD removed  -discharge to CIR today  [name]  03/19/21 7:51 AM

## 2021-03-19 NOTE — Progress Notes (Signed)
Inpatient Rehab Admissions Coordinator:   I have a bed for this Pt. On CIR today and plan to admit. RN may call report to [phone] after 12pm  Megan Salon, MS, CCC-SLP Rehab Admissions Coordinator  [phone] (celll) [phone] (office)

## 2021-03-20 MED ORDER — JUVEN PO PACK
1.0000 | PACK | Freq: Two times a day (BID) | ORAL | Status: DC
  Administered 2021-03-20 – 2021-03-30 (×20): 1 via ORAL
  Filled 2021-03-20 (×21): qty 1

## 2021-03-20 MED ORDER — MELATONIN 3 MG PO TABS
3.0000 mg | ORAL_TABLET | Freq: Every day | ORAL | Status: DC
  Administered 2021-03-20 – 2021-03-21 (×2): 3 mg via ORAL
  Filled 2021-03-20 (×2): qty 1

## 2021-03-20 NOTE — Evaluation (Signed)
Physical Therapy Assessment and Plan  Patient Details  Name: Casey Branch MRN: [id number] Date of Birth: 1981/08/26  PT Diagnosis: Abnormality of gait, Cognitive deficits, Difficulty walking, Impaired cognition, and Muscle weakness Rehab Potential: Good ELOS: 3 weeks   Today's Date: 03/20/2021 PT Individual Time: 0800-0900 PT Individual Time Calculation (min): 60 min    Hospital Problem: Principal Problem:   Intraventricular hemorrhage ([place]) Active Problems:   ICH (intracerebral hemorrhage) ([place])   Past Medical History:  Past Medical History:  Diagnosis Date   Brain aneurysm    Past Surgical History:  Past Surgical History:  Procedure Laterality Date   IR 3D INDEPENDENT WKST  03/11/2021   IR ANGIO INTRA EXTRACRAN SEL COM CAROTID INNOMINATE BILAT MOD SED  03/11/2021   IR ANGIO VERTEBRAL SEL VERTEBRAL BILAT MOD SED  03/11/2021   RADIOLOGY WITH ANESTHESIA N/A 03/11/2021   Procedure: IR WITH ANESTHESIA - DIAGNOSTIC CEREBRAL ANGIOGRAM;  Surgeon: Consuella Lose, MD;  Location: [place];  Service: Radiology;  Laterality: N/A;   VENTRICULOPERITONEAL SHUNT      Assessment & Plan Clinical Impression: Patient is a 40 year old right-handed male with past medical history significant for tobacco use, 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  Per chart review lives with spouse independent prior to admission.  Presented 03/08/2021 with altered mental status and right-sided weakness, with slurred speech nausea and vomiting of acute onset with left gaze preference.  There was a reported unwitnessed fall.  He was intubated for airway protection.  Admission chemistries unremarkable except potassium 3.4, glucose 138, hemoglobin 14.6, SARS coronavirus positive.  Cranial CT scan showed large volume intraventricular hemorrhage primarily in the left lateral ventricle but also in the right ventricle and third ventricle.  Increased density and mild increase in thickness in the  tentorium bilaterally possibly with layering of blood.  Mild hydrocephalus.  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  No cerebral aneurysm.  He was initially placed on 3% hypertonic saline.  Echocardiogram with ejection fraction of 50 to 55% no wall motion abnormalities.  Patient underwent right frontal ventriculostomy 03/08/2021 per Dr. Venetia Constable followed by diagnostic cerebral angiogram per Dr. Kathyrn Sheriff findings of [name] grade 4 left thalamic AVM.Marland Kitchen  Patient was extubated 03/13/2021.  Venous Doppler right upper extremity showed a small acute superficial vein thrombosis involving the right cephalic vein and age-indeterminate DVT right axillary vein.  Patient was cleared to begin subcutaneous heparin for DVT prophylaxis and would not need long-term anticoagulation.  Venous Doppler studies negative.  Patient did complete a 7-day course of antibiotics for MSSA trach aspirate and group C strep.  In regards to patient's incidental finding of COVID patient was treated airborne precautions until 03/16/2021 and discontinued.  Patient did initially have bouts of agitation restlessness maintained on low-dose Precedex.  Maintained on a regular consistency diet.  Therapy evaluations completed due to patient decreased functional mobility was admitted for a comprehensive rehab program. Patient transferred to CIR on 03/19/2021 .   Patient currently requires mod with mobility secondary to muscle weakness, decreased cardiorespiratoy endurance, impaired timing and sequencing, unbalanced muscle activation, motor apraxia, decreased coordination, and decreased motor planning, decreased visual perceptual skills, decreased midline orientation, decreased attention to right, right side neglect, decreased motor planning, and ideational apraxia, decreased  initiation, decreased attention, decreased awareness, decreased problem solving, decreased safety awareness, decreased memory, and delayed processing, and  decreased sitting balance, decreased standing balance, decreased postural control, hemiplegia, and decreased balance strategies.  Prior to hospitalization, patient was independent  with mobility and lived with Spouse, Family in a House home.  Home access is  Level entry.  Patient will benefit from skilled PT intervention to maximize safe functional mobility, minimize fall risk, and decrease caregiver burden for planned discharge home with 24 hour assist.  Anticipate patient will  benefit from HHPT vs OPPT (pending progress and access to transportation  at discharge.  PT - End of Session Activity Tolerance: Tolerates 30+ min activity with multiple rests Endurance Deficit: Yes Endurance Deficit Description: Benefits from brief seated rest breaks b/w functional mobility tasks. PT Assessment Rehab Potential (ACUTE/IP ONLY): Good PT Barriers to Discharge: Insurance for SNF coverage;Behavior;Weight;Nutrition means;Incontinence PT Patient demonstrates impairments in the following area(s): Balance;Behavior;Endurance;Motor;Nutrition;Perception;Safety;Sensory;Skin Integrity PT Transfers Functional Problem(s): Bed Mobility;Bed to Chair;Car PT Locomotion Functional Problem(s): Ambulation;Stairs PT Plan PT Intensity: Minimum of 1-2 x/day ,45 to 90 minutes PT Frequency: 5 out of 7 days PT Duration Estimated Length of Stay: 3 weeks PT Treatment/Interventions: Ambulation/gait training;Discharge planning;Functional mobility training;Psychosocial support;Therapeutic Activities;Visual/perceptual remediation/compensation;Therapeutic Exercise;Wheelchair propulsion/positioning;Skin care/wound management;Neuromuscular re-education;Disease management/prevention;Balance/vestibular training;Cognitive remediation/compensation;DME/adaptive equipment instruction;Pain  management;Splinting/orthotics;UE/LE Strength taining/ROM;UE/LE Coordination activities;Stair training;Patient/family education;Community reintegration PT Transfers Anticipated Outcome(s): CGA with LRAD PT Locomotion Anticipated Outcome(s): CGA with LRAD PT Recommendation Follow Up Recommendations: 24 hour supervision/assistance;Home health PT;Other (comment) (may progress to OPPT pending progress) Patient destination: Home Equipment Recommended: To be determined   PT Evaluation Precautions/Restrictions Precautions Precautions: Fall Precaution Comments: R inattention, L gaze preference, Impulsive Restrictions Weight Bearing Restrictions: No General Chart Reviewed: Yes Family/Caregiver Present: Yes (Casey Branch)  Pain Pain Assessment Pain Scale: Faces Pain Score: 0-No pain Home Living/Prior Functioning Home Living Living Arrangements: Spouse/significant other Available Help at Discharge: Family;Available 24 hours/day Type of Home: House Home Access: Level entry Home Layout: One level Bathroom Shower/Tub: Tub only;Tub/shower unit;Walk-in shower Bathroom Toilet: Standard  Lives With: Spouse;Family Prior Function Level of Independence: Independent with gait;Independent with transfers;Independent with homemaking with ambulation  Able to Take Stairs?: Yes Driving: Yes Vocation: Full time employment 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 Vision/Perception  Vision - Assessment Eye Alignment: Impaired (comment) (L gaze) Ocular Range of Motion: Restricted on the right;Restricted looking up;Impaired-to be further tested in functional context Alignment/Gaze Preference: Gaze left Tracking/Visual Pursuits: Right eye does not track laterally;Left eye does not track medially;Decreased smoothness of vertical tracking;Decreased smoothness of horizontal tracking Perception Perception: Impaired Inattention/Neglect: Does not attend to right visual field;Does not attend to right side of  body Praxis Praxis: Impaired Praxis Impairment Details: Ideation;Initiation;Ideomotor;Motor planning Praxis-Other Comments: When instructed to lift leg to assess strength, he would lift his arms  Cognition Overall Cognitive Status: Impaired/Different from baseline Arousal/Alertness: Awake/alert Orientation Level: Oriented to person;Disoriented to place;Disoriented to time;Disoriented to situation;Other (comment) (Oriented to name only. Unable to provide D.O.B) Attention: Focused;Sustained Focused Attention: Impaired Focused Attention Impairment: Verbal basic;Functional basic Sustained Attention: Impaired Sustained Attention Impairment: Functional basic;Verbal basic Memory: Impaired Awareness: Impaired Problem Solving: Impaired Problem Solving Impairment: Verbal basic;Functional basic Behaviors: Impulsive Safety/Judgment: Impaired Sensation Sensation Light Touch: Impaired by gross assessment Hot/Cold: Not tested Proprioception: Impaired by gross assessment Stereognosis: Not tested Additional Comments: Difficult to assess due to cognitive deficits Coordination Gross Motor Movements are Fluid and Coordinated: No Fine Motor Movements are Fluid and Coordinated: No Coordination and Movement Description: Slowed movement patterns impacted by delayed processing and initiation Motor  Motor Motor: Motor apraxia;Hemiplegia Motor - Skilled Clinical Observations: R hemi   Trunk/Postural Assessment  Cervical Assessment Cervical Assessment: Within Functional Limits Thoracic Assessment Thoracic Assessment: Within Functional Limits Lumbar Assessment Lumbar Assessment: Within Functional Limits Postural Control Postural Control: Deficits on evaluation Righting Reactions: Delayed Protective Responses: Delayed  Balance Balance Balance Assessed: Yes Static Sitting Balance Static Sitting - Balance Support: Feet supported;No upper extremity supported Static Sitting - Level of Assistance:  4: Min assist Dynamic Sitting Balance Dynamic Sitting - Balance Support: During functional activity;Feet supported;No upper extremity supported Dynamic Sitting - Level of Assistance: 3: Mod assist Static Standing Balance Static Standing - Balance Support: No upper extremity supported Static Standing - Level of Assistance: 3: Mod assist Dynamic Standing Balance Dynamic Standing - Balance Support: During functional activity;No upper extremity supported Dynamic Standing - Level of Assistance: 3: Mod assist Extremity Assessment      RLE Assessment RLE Assessment: Exceptions to [facility] LLE Assessment LLE Assessment: Within Functional Limits General Strength Comments: Grossly 4+/5  Care Tool Care Tool Bed Mobility Roll left and right activity   Roll left and right assist level: Minimal Assistance - Patient > 75%    Sit to lying activity   Sit to lying assist level: Moderate Assistance - Patient 50 - 74%    Lying to sitting edge of bed activity   Lying to sitting edge of bed assist level: Moderate Assistance - Patient 50 - 74%     Care Tool Transfers Sit to stand transfer   Sit to stand assist level: Moderate Assistance - Patient 50 - 74%    Chair/bed transfer   Chair/bed transfer assist level: Moderate Assistance - Patient 50 - 74%     Psychologist, counselling transfer activity did not occur: Safety/medical concerns        Care Tool Locomotion Ambulation   Assist level: Moderate Assistance - Patient 50 - 74% Assistive device: Walker-rolling Max distance: 52f  Walk 10 feet activity   Assist level: Moderate Assistance - Patient - 50 - 74% Assistive device: Walker-rolling   Walk 50 feet with 2 turns activity   Assist level: Moderate Assistance - Patient - 50 - 74% Assistive device: Walker-rolling  Walk 150 feet activity Walk 150 feet activity did not occur: Safety/medical concerns (fatigue)      Walk 10 feet on uneven surfaces activity Walk 10 feet on  uneven surfaces activity did not occur: Safety/medical concerns      Stairs Stair activity did not occur: Safety/medical concerns        Walk up/down 1 step activity Walk up/down 1 step or curb (drop down) activity did not occur: Safety/medical concerns     Walk up/down 4 steps activity did not occuR: Safety/medical concerns  Walk up/down 4 steps activity      Walk up/down 12 steps activity Walk up/down 12 steps activity did not occur: Safety/medical concerns      Pick up small objects from floor Pick up small object from the floor (from standing position) activity did not occur: Safety/medical concerns      Wheelchair Will patient use wheelchair at discharge?: No   Wheelchair activity did not occur: N/A      Wheel 50 feet with 2 turns activity Wheelchair 50 feet with 2 turns activity did not occur: N/A    Wheel 150 feet activity Wheelchair 150 feet activity did not occur: N/A      Refer to Care Plan for Long Term Goals  SHORT TERM GOAL WEEK 1 PT Short Term Goal 1 (Week 1):  Pt will comlete bed mobility with minA PT Short Term Goal 2 (Week 1): Pt will complete bed<>chair transfers with minA and LRAD PT Short Term Goal 3 (Week 1): Pt will ambulate 166f with minA and LRAD PT Short Term Goal 4 (Week 1): Pt will show improved signs of safety awareness and decreased impulsivity during therapies  Recommendations for other services: Neuropsych  Skilled Therapeutic Intervention Mobility Bed Mobility Bed Mobility: Rolling Right;Rolling Left;Supine to Sit;Sit to Supine Rolling Right: Minimal Assistance - Patient > 75% Rolling Left: Minimal Assistance - Patient > 75% Supine to Sit: Moderate Assistance - Patient 50-74% Sit to Supine: Moderate Assistance - Patient 50-74% Transfers Transfers: Sit to Stand;Stand Pivot Transfers;Stand to Sit;Squat Pivot Transfers Sit to Stand: Moderate Assistance - Patient 50-74% Stand to Sit: Moderate Assistance - Patient 50-74% Stand Pivot  Transfers: Moderate Assistance - Patient 50 - 74% Stand Pivot Transfer Details: Verbal cues for sequencing;Verbal cues for technique;Verbal cues for precautions/safety;Visual cues/gestures for precautions/safety;Visual cues for safe use of DME/AE;Tactile cues for weight beaing;Visual cues/gestures for sequencing;Tactile cues for placement Squat Pivot Transfers: Moderate Assistance - Patient 50-74% Transfer (Assistive device): None Locomotion  Gait Ambulation: Yes Gait Assistance: Moderate Assistance - Patient 50-74% Gait Distance (Feet): 85 Feet Assistive device: Rolling walker Gait Assistance Details: Verbal cues for sequencing;Visual cues/gestures for sequencing;Verbal cues for gait pattern;Verbal cues for safe use of DME/AE;Verbal cues for technique;Verbal cues for precautions/safety;Manual facilitation for weight shifting;Tactile cues for posture;Visual cues for safe use of DME/AE;Tactile cues for sequencing;Tactile cues for initiation;Tactile cues for placement;Tactile cues for weight shifting Gait Gait: Yes Gait Pattern: Impaired Gait Pattern: Poor foot clearance - left;Poor foot clearance - right;Narrow base of support;Step-through pattern;Decreased step length - right;Decreased step length - left;Decreased hip/knee flexion - right;Decreased hip/knee flexion - left;Decreased weight shift to right Gait velocity: decreased Stairs / Additional Locomotion Stairs: No Wheelchair Mobility Wheelchair Mobility: No  Skilled Intervention: Pt greeted supine in bed to start session and was agreeable to PT evaluation. Casey Branch at bedside to assist with answering PLOF and social factors. Pt alert and oriented to self only, although he is able to state he is [place] He was quite lethargic at start of session and would frequently dose off to sleep. Casey Branch reports pt is fully indep at baseline, working full time and has 2 daughters. They live in a 1 lvl home with level entry access. She reports she  works from home and can provide 24/7 - there are also other family members who can assist as well. Educated Casey Branch on [employer] rehab schedules, difference b/w therapies, role of PT and PT POC, etc. Retrieved 18x18 w/c from DME closet. Initiated functional mobility as outlined above. Required modA for bed mobility with R lean in static sitting. Squat<>pivot transfer with modA to w/c and wheeled to main rehab gym. Sit<>stand to RW with minA and gait training 814fwith modA and RW - gait deficits include decreased bilateral step length (R>L), narrow BOS, and poor ability to manage RW. Cues for corrections throughout. Wheeled back to room and assisted back to bed via stand<>pivot and modA. Required modA for bed mobility and made comfortable in bed. Bed alarm set and all needs in reach. Family at bedside.   Instructed pt in results of PT evaluation as detailed above, PT POC, rehab potential, rehab goals, and discharge recommendations. Additionally discussed CIR's policies regarding fall safety and use of chair alarm and/or quick release belt. Pt verbalized understanding and in agreement. Will update pt's family members  as they become available.   Discharge Criteria: Patient will be discharged from PT if patient refuses treatment 3 consecutive times without medical reason, if treatment goals not met, if there is a change in medical status, if patient makes no progress towards goals or if patient is discharged from hospital.  The above assessment, treatment plan, treatment alternatives and goals were discussed and mutually agreed upon: by patient and by family  Alger Simons PT, DPT 03/20/2021, 12:45 PM

## 2021-03-20 NOTE — Progress Notes (Signed)
Patient ID: Casey Branch, male   DOB: May 12, 1981, 40 y.o.   MRN: [id number]  Signed FMLA paperwork provided to pt spouse Casey Branch)  [place], [place] [phone]

## 2021-03-20 NOTE — Progress Notes (Signed)
Inpatient Rehabilitation Care Coordinator Assessment and Plan Patient Details  Name: Casey Branch MRN: [id number] Date of Birth: Dec 13, 1980  Today's Date: 03/20/2021  Hospital Problems: Principal Problem:   Intraventricular hemorrhage ([place]) Active Problems:   ICH (intracerebral hemorrhage) ([place])  Past Medical History:  Past Medical History:  Diagnosis Date   Brain aneurysm    Past Surgical History:  Past Surgical History:  Procedure Laterality Date   IR 3D INDEPENDENT WKST  03/11/2021   IR ANGIO INTRA EXTRACRAN SEL COM CAROTID INNOMINATE BILAT MOD SED  03/11/2021   IR ANGIO VERTEBRAL SEL VERTEBRAL BILAT MOD SED  03/11/2021   RADIOLOGY WITH ANESTHESIA N/A 03/11/2021   Procedure: IR WITH ANESTHESIA - DIAGNOSTIC CEREBRAL ANGIOGRAM;  Surgeon: Consuella Lose, MD;  Location: [facility];  Service: Radiology;  Laterality: N/A;   VENTRICULOPERITONEAL SHUNT     Social History:  reports that he has been smoking. He does not have any smokeless tobacco history on file. He reports current alcohol use. He reports that he does not use drugs.  Family / Support Systems Marital Status: Married Patient Roles: Spouse Spouse/Significant Other: sierra Other Supports: Hyacinth Meeker, RN (Sister) Anticipated Caregiver: spouse Ability/Limitations of Caregiver: min A Caregiver Availability: 24/7 Family Dynamics: Pt will have support from spouse and sister who is a Therapist, sports  Social History Preferred language: English Religion:  Read: Yes Write: Yes Legal History/Current Legal Issues: n/a Guardian/Conservator: n/a   Abuse/Neglect Abuse/Neglect Assessment Can Be Completed: Unable to assess, patient is non-responsive or altered mental status  Emotional Status Recent Psychosocial Issues: n/a Psychiatric History: n/a Substance Abuse History: n/a  Patient / Family Perceptions, Expectations & Goals Pt/Family understanding of illness & functional limitations: yes Premorbid pt/family roles/activities: pt  previously independent Anticipated changes in roles/activities/participation: spouse able to assist with roles and task Pt/family expectations/goals: [place]: None Premorbid Home Care/DME Agencies: None Transportation available at discharge: family able to transport  Discharge Planning Living Arrangements: Spouse/significant other Support Systems: Spouse/significant other Type of Residence: Private residence (1 level home, level entry) Insurance Resources: Teacher, adult education Screen Referred: No Living Expenses: Lives with family Money Management: Spouse, Patient Does the patient have any problems obtaining your medications?: No Home Management: independent Patient/Family Preliminary Plans: spouse will assist with medication and med management Care Coordinator Barriers to Discharge: Other (comments), Decreased caregiver support, Neurogenic Bowel & Bladder, Insurance for SNF coverage, 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Care Coordinator Barriers to Discharge Comments: [place], [place] Planning Additional Notes/Comments: pt uninsured Expected length of stay: 21-24 Days  Clinical Impression Covering for primary SW, Becky,   [place] met with pt, spouse and pt sister. Introduced self, explained role and addressed questions and concerns. Pt spouse requesting FMLA paperwork to be completed, submitted back spouse on 6/30. Pt will d/c home with spouse and pt sister who is a Therapist, sports. Pt was working previously, pt spouse in contact with pt job. Sw followed up with Trinidad Curet on pt Medicaid Application status, awaiting follow up. No additional questions or concerns, sw will cont to follow up.  Dyanne Iha 03/20/2021, 1:15 PM

## 2021-03-20 NOTE — Progress Notes (Signed)
Inpatient Rehabilitation Center Individual Statement of Services  Patient Name:  Casey Branch  Date:  03/20/2021  Welcome to the Inpatient Rehabilitation Center.  Our goal is to provide you with an individualized program based on your diagnosis and situation, designed to meet your specific needs.  With this comprehensive rehabilitation program, you will be expected to participate in at least 3 hours of rehabilitation therapies Monday-Friday, with modified therapy programming on the weekends.  Your rehabilitation program will include the following services:  Physical Therapy (PT), Occupational Therapy (OT), Speech Therapy (ST), 24 hour per day rehabilitation nursing, Therapeutic Recreaction (TR), Neuropsychology, Care Coordinator, Rehabilitation Medicine, Nutrition Services, Pharmacy Services, and Other  Weekly team conferences will be held on Tuesdays to discuss your progress.  Your Inpatient Rehabilitation Care Coordinator will talk with you frequently to get your input and to update you on team discussions.  Team conferences with you and your family in attendance may also be held.  Expected length of stay: 21-24 Days  Overall anticipated outcome: Min A  Depending on your progress and recovery, your program may change. Your Inpatient Rehabilitation Care Coordinator will coordinate services and will keep you informed of any changes. Your Inpatient Rehabilitation Care Coordinator's name and contact numbers are listed  below.  The following services may also be recommended but are not provided by the Inpatient Rehabilitation Center:   Home Health Rehabiltiation Services Outpatient Rehabilitation Services    Arrangements will be made to provide these services after discharge if needed.  Arrangements include referral to agencies that provide these services.  Your insurance has been verified to be:  uninsured Your primary doctor is:  NO PCP  Pertinent information will be shared with your doctor  and your insurance company.  Inpatient Rehabilitation Care Coordinator:  Lavera Guise, [place] [phone] or [phone]  Information discussed with and copy given to patient by: Andria Rhein, 03/20/2021, 11:52 AM

## 2021-03-20 NOTE — Evaluation (Signed)
Speech Language Pathology Assessment and Plan  Patient Details  Name: Casey Branch MRN: [id number] Date of Birth: Jul 18, 1981  SLP Diagnosis: Aphasia;Cognitive Impairments;Speech and Language deficits  Rehab Potential: Good ELOS: 3 weeks    Today's Date: 03/20/2021 SLP Individual Time: 1300-1350 SLP Individual Time Calculation (min): 50 min   Hospital Problem: Principal Problem:   Intraventricular hemorrhage ([place]) Active Problems:   ICH (intracerebral hemorrhage) ([place])  Past Medical History:  Past Medical History:  Diagnosis Date   Brain aneurysm    Past Surgical History:  Past Surgical History:  Procedure Laterality Date   IR 3D INDEPENDENT WKST  03/11/2021   IR ANGIO INTRA EXTRACRAN SEL COM CAROTID INNOMINATE BILAT MOD SED  03/11/2021   IR ANGIO VERTEBRAL SEL VERTEBRAL BILAT MOD SED  03/11/2021   RADIOLOGY WITH ANESTHESIA N/A 03/11/2021   Procedure: IR WITH ANESTHESIA - DIAGNOSTIC CEREBRAL ANGIOGRAM;  Surgeon: Consuella Lose, MD;  Location: [facility];  Service: Radiology;  Laterality: N/A;   VENTRICULOPERITONEAL SHUNT      Assessment / Plan / Recommendation  Casey Branch is a 40 year old right-handed male with past medical history significant for tobacco use, 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  Per chart review lives with spouse independent prior to admission.  Presented 03/08/2021 with altered mental status and right-sided weakness, with slurred speech nausea and vomiting of acute onset with left gaze preference.  There was a reported unwitnessed fall.  He was intubated for airway protection.  Admission chemistries unremarkable except potassium 3.4, glucose 138, hemoglobin 14.6, SARS coronavirus positive.  Cranial CT scan showed large volume intraventricular hemorrhage primarily in the left lateral ventricle but also in the right ventricle and third ventricle.  Increased density and mild increase in thickness in the tentorium bilaterally possibly  with layering of blood.  Mild hydrocephalus.  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  No cerebral aneurysm.  He was initially placed on 3% hypertonic saline.  Echocardiogram with ejection fraction of 50 to 55% no wall motion abnormalities.  Patient underwent right frontal ventriculostomy 03/08/2021 per Dr. Venetia Constable followed by diagnostic cerebral angiogram per Dr. Kathyrn Sheriff findings of Casey Branch grade 4 left thalamic AVM.Marland Kitchen  Patient was extubated 03/13/2021.  Venous Doppler right upper extremity showed a small acute superficial vein thrombosis involving the right cephalic vein and age-indeterminate DVT right axillary vein.  Patient was cleared to begin subcutaneous heparin for DVT prophylaxis and would not need long-term anticoagulation.  Venous Doppler studies negative.  Patient did complete a 7-day course of antibiotics for MSSA trach aspirate and group C strep.  In regards to patient's incidental finding of COVID patient was treated airborne precautions until 03/16/2021 and discontinued.  Patient did initially have bouts of agitation restlessness maintained on low-dose Precedex.  Maintained on a regular consistency diet.  .  Patient transferred to CIR on 03/19/2021 .    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 During Acute care stay, patient had MBS as well as a follow up diet ck and as he met all swallow function goals, CIR SLP does not intend to address dysphagia. Patient was sitting up in recliner with his wife and sister present. Despite maximal amount of cues to alert him from  SLP and family members, adequate periods of  alertness were limited. Patient exhibited perseveration without awareness when naming objects and object pictures. When SLP holding objects in front of him in field of two, he did not exhibit ability to visual attend. He did produce one phrase that was appropriate, saying "I can't do anymore, I need to sleep". Patient appeared impulsive, restless and frequently repositioning self in recliner. 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 As patient is currently exhibiting a significant decline from his baseline level of cognition, skilled SLP intervention is necessary to cognitive-linguistic goals.  Skilled Therapeutic Interventions          Speech-Language and cognitive evaluation  SLP Assessment  Patient will need skilled [facility] during CIR admission    Recommendations  Recommendations for Other Services: Neuropsych consult Patient destination: Home Follow up Recommendations: Outpatient SLP;Home Health SLP;Other (comment) (Outpatient but HH if unable to get Outpatient) Equipment Recommended: None recommended by SLP    SLP Frequency 3 to 5 out of 7 days   SLP Duration  SLP Intensity  SLP Treatment/Interventions 3 weeks  Minumum of 1-2 x/day, 30 to 90 minutes  Cognitive remediation/compensation;Internal/external aids;Speech/Language facilitation;Cueing hierarchy;Functional tasks;Patient/family education    Pain Pain Assessment Pain Scale: Faces Faces Pain Scale: No hurt  Prior Functioning Cognitive/Linguistic Baseline: Within functional limits Type of Home: House  Lives With: Spouse;Family Available Help at Discharge: Family;Available 24 hours/day Vocation: Full time employment  SLP Evaluation Cognition Overall Cognitive Status: Impaired/Different from baseline Arousal/Alertness: Lethargic Orientation  Level: Oriented to person;Disoriented to place;Disoriented to time;Disoriented to situation Attention: Focused;Sustained Focused Attention: Impaired Focused Attention Impairment: Verbal basic;Functional basic Sustained Attention: Impaired Sustained Attention Impairment: Verbal basic;Functional basic Memory: Impaired Memory Impairment: Other (comment) (patient's alertness too poor to assess memory) Immediate Memory Recall:  (unable to recall 3/3 words due to confusion) Memory Recall Sock: Not able to recall Memory Recall Blue: Not able to recall Memory Recall Bed: Not able to recall Awareness: Impaired Awareness Impairment: Intellectual impairment Problem Solving: Impaired Problem Solving Impairment: Verbal basic;Functional basic Executive Function: Initiating Reasoning: Impaired Reasoning Impairment: Verbal basic;Functional basic Sequencing: Impaired Sequencing Impairment: Verbal basic;Functional basic Decision Making: Impaired Decision Making Impairment: Verbal basic;Functional basic Initiating: Impaired Initiating Impairment: Verbal basic;Functional basic Self Monitoring: Impaired Self Monitoring Impairment: Verbal basic;Functional basic Self Correcting: Impaired Self Correcting Impairment: Verbal basic;Functional basic Behaviors: Impulsive Safety/Judgment: Impaired  Comprehension Auditory Comprehension Overall Auditory Comprehension: Impaired Yes/No Questions: Not tested Commands: Impaired One Step Basic Commands: 25-49% accurate Multistep Basic Commands: Not tested Conversation: Simple Interfering Components: Attention;Visual impairments;Processing speed EffectiveTechniques: Extra processing time;Increased volume;Repetition Visual Recognition/Discrimination Discrimination: Not tested Reading Comprehension Reading Status: Not tested Expression Expression Primary Mode of Expression: Verbal Verbal Expression Overall Verbal Expression: Impaired Initiation:  Impaired Level of Generative/Spontaneous Verbalization: Word;Phrase Naming: Impairment Responsive: 0-25% accurate Confrontation: Impaired Convergent: Not tested Divergent: Not tested Verbal Errors: Perseveration;Not aware of errors Pragmatics: Impairment Impairments: Abnormal affect;Eye contact Interfering Components: Attention Effective Techniques: Semantic cues;Sentence completion Non-Verbal Means of Communication: Not applicable Written Expression Dominant Hand: Right Written Expression: Not tested Oral Motor Oral Motor/Sensory Function Overall Oral Motor/Sensory Function: Other (comment) (patient unable to particiapte in full oral motor examination)  Care Tool Care Tool Cognition Expression of Ideas and Wants Expression of Ideas and Wants: Frequent difficulty - frequently exhibits difficulty with expressing needs and ideas   Understanding Verbal  and Non-Verbal Content Understanding Verbal and Non-Verbal Content: Rarely/never understands   Memory/Recall Ability *first 3 days only Memory/Recall Ability *first 3 days only: None of the above were recalled       Short Term Goals: Week 1: SLP Short Term Goal 1 (Week 1): Patient will maintain attention to basic level functional tasks for increments of 2-3 minutes with maxA cues. SLP Short Term Goal 2 (Week 1): Patient will perform familiar functional ADL's (brush teeth, Branch, etc) with modA cues for accuracy. SLP Short Term Goal 3 (Week 1): Patient will point to identify objects/object pictures or photos in field of two with modA cues for at least 60% accuracy. SLP Short Term Goal 4 (Week 1): Patient will name at least 10 different common objects with mod-maxA. SLP Short Term Goal 5 (Week 1): Patient will match object to picture/photo in field of 2-3 with modA cues.  Refer to Care Plan for Long Term Goals  Recommendations for other services: Neuropsych  Discharge Criteria: Patient will be discharged from SLP if patient refuses  treatment 3 consecutive times without medical reason, if treatment goals not met, if there is a change in medical status, if patient makes no progress towards goals or if patient is discharged from hospital.  The above assessment, treatment plan, treatment alternatives and goals were discussed and mutually agreed upon: by family  Sonia Baller, MA, CCC-SLP Speech Therapy

## 2021-03-20 NOTE — Evaluation (Signed)
Occupational Therapy Assessment and Plan  Patient Details  Name: Casey Branch MRN: [id number] Date of Birth: May 24, 1981  OT Diagnosis: altered mental status, apraxia, cognitive deficits, disturbance of vision, and hemiplegia affecting dominant side Rehab Potential: Rehab Potential (ACUTE ONLY): Good ELOS: 3 weeks   Today's Date: 03/20/2021 OT Individual Time: 1055-1210 OT Individual Time Calculation (min): 75 min     Hospital Problem: Principal Problem:   Intraventricular hemorrhage ([place]) Active Problems:   ICH (intracerebral hemorrhage) ([place])   Past Medical History:  Past Medical History:  Diagnosis Date   Brain aneurysm    Past Surgical History:  Past Surgical History:  Procedure Laterality Date   IR 3D INDEPENDENT WKST  03/11/2021   IR ANGIO INTRA EXTRACRAN SEL COM CAROTID INNOMINATE BILAT MOD SED  03/11/2021   IR ANGIO VERTEBRAL SEL VERTEBRAL BILAT MOD SED  03/11/2021   RADIOLOGY WITH ANESTHESIA N/A 03/11/2021   Procedure: IR WITH ANESTHESIA - DIAGNOSTIC CEREBRAL ANGIOGRAM;  Surgeon: Consuella Lose, MD;  Location: [facility];  Service: Radiology;  Laterality: N/A;   VENTRICULOPERITONEAL SHUNT      Assessment & Plan Clinical Impression:  Casey Branch is a 40 year old right-handed male with past medical history significant for tobacco use, 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  Per chart review lives with spouse independent prior to admission.  Presented 03/08/2021 with altered mental status and right-sided weakness, with slurred speech nausea and vomiting of acute onset with left gaze preference.  There was a reported unwitnessed fall.  He was intubated for airway protection.  Admission chemistries unremarkable except potassium 3.4, glucose 138, hemoglobin 14.6, SARS coronavirus positive.  Cranial CT scan showed large volume intraventricular hemorrhage primarily in the left lateral ventricle but also in the right ventricle and third ventricle.   Increased density and mild increase in thickness in the tentorium bilaterally possibly with layering of blood.  Mild hydrocephalus.  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  No cerebral aneurysm.  He was initially placed on 3% hypertonic saline.  Echocardiogram with ejection fraction of 50 to 55% no wall motion abnormalities.  Patient underwent right frontal ventriculostomy 03/08/2021 per Dr. Venetia Constable followed by diagnostic cerebral angiogram per Dr. Kathyrn Sheriff findings of Casey Branch grade 4 left thalamic AVM.Marland Kitchen  Patient was extubated 03/13/2021.  Venous Doppler right upper extremity showed a small acute superficial vein thrombosis involving the right cephalic vein and age-indeterminate DVT right axillary vein.  Patient was cleared to begin subcutaneous heparin for DVT prophylaxis and would not need long-term anticoagulation.  Venous Doppler studies negative.  Patient did complete a 7-day course of antibiotics for MSSA trach aspirate and group C strep.  In regards to patient's incidental finding of COVID patient was treated airborne precautions until 03/16/2021 and discontinued.  Patient did initially have bouts of agitation restlessness maintained on low-dose Precedex.  Maintained on a regular consistency diet.  .  Patient transferred to CIR on 03/19/2021 .    Patient currently requires max with basic self-care skills secondary to muscle weakness, decreased cardiorespiratoy endurance, impaired timing and sequencing, motor apraxia, decreased coordination, and decreased motor planning, decreased visual acuity, decreased visual perceptual skills, decreased visual motor skills, field cut, and hemianopsia, decreased midline orientation, decreased attention to right, right side neglect, decreased motor planning, and ideational apraxia, decreased  initiation, decreased attention, decreased awareness, decreased problem solving, decreased safety awareness,  decreased memory, and delayed processing, and decreased sitting balance, decreased standing balance, hemiplegia, and decreased balance strategies.  Prior to hospitalization, patient could complete ADLs and IADLs with independent .  Patient will benefit from skilled intervention to increase independence with basic self-care skills prior to discharge home with care partner.  Anticipate patient will require 24 hour supervision and minimal physical assistance and follow up home health and follow up outpatient.  OT - End of Session Activity Tolerance: Tolerates 30+ min activity with multiple rests Endurance Deficit: Yes Endurance Deficit Description: Benefits from brief seated rest breaks b/w functional mobility tasks. OT Assessment Rehab Potential (ACUTE ONLY): Good OT Patient demonstrates impairments in the following area(s): Balance;Perception;Behavior;Safety;Cognition;Sensory;Endurance;Skin Integrity;Vision;Motor;Pain OT Basic ADL's Functional Problem(s): Eating;Grooming;Bathing;Dressing;Toileting OT Transfers Functional Problem(s): Toilet;Tub/Shower OT Additional Impairment(s): Fuctional Use of Upper Extremity (Pts wife reports he has been having difficulty using RUE funcitonally) OT Plan OT Intensity: Minimum of 1-2 x/day, 45 to 90 minutes OT Frequency: 5 out of 7 days OT Duration/Estimated Length of Stay: 3 weeks OT Treatment/Interventions: Balance/vestibular training;Discharge planning;Functional electrical stimulation;Self Care/advanced ADL retraining;Therapeutic Activities;UE/LE Coordination activities;Pain management;Cognitive remediation/compensation;Disease mangement/prevention;Functional mobility training;Patient/family education;Skin care/wound managment;Therapeutic Exercise;Visual/perceptual remediation/compensation;Neuromuscular re-education;Psychosocial support;DME/adaptive  equipment instruction;UE/LE Strength taining/ROM;Wheelchair propulsion/positioning OT Self Feeding Anticipated Outcome(s): supervision OT Basic Self-Care Anticipated Outcome(s): Supervision UB; CGA LB OT Toileting Anticipated Outcome(s): min assist OT Bathroom Transfers Anticipated Outcome(s): CGA OT Recommendation Patient destination: Home Follow Up Recommendations: 24 hour supervision/assistance;Outpatient OT;Home health OT Equipment Recommended: To be determined Equipment Details: Pts sister has a RW and elevated toilet seat with arm rests that pt can borrow per her report   OT Evaluation Precautions/Restrictions  Precautions Precautions: Fall Precaution Comments: R inattention, L gaze preference, Impulsive Restrictions Weight Bearing Restrictions: No General Chart Reviewed: Yes Pain Pain Assessment Pain Scale: Faces Faces Pain Scale: No hurt Home Living/Prior Functioning Home Living Family/patient expects to be discharged to:: Unsure Living Arrangements: Spouse/significant other Available Help at Discharge: Family, Available 24 hours/day Type of Home: House Home Access: Level entry Home Layout: One level Bathroom Shower/Tub: Tub only, Tub/shower unit, Estate agent Accessibility: Yes  Lives With: Spouse, Family Prior Function Level of Independence: Independent with gait, Independent with transfers, Independent with homemaking with ambulation, Independent with basic ADLs  Able to Take Stairs?: Yes Driving: Yes Vocation: Full time employment Vocation Requirements: Buyer, retail Vision Baseline Vision/History: No visual deficits Patient Visual Report: Diplopia;Blurring of vision (Pt reports "I see things then I have to see through another thing"; difficult to assess due to pt confusion) Vision Assessment?: Yes Eye Alignment: Impaired (comment) (left gaze (right eye more the left)) Ocular Range of Motion:  Restricted on the right;Restricted looking up;Impaired-to be further tested in functional context Alignment/Gaze Preference: Gaze left Tracking/Visual Pursuits: Right eye does not track laterally;Left eye does not track medially;Decreased smoothness of vertical tracking;Decreased smoothness of horizontal tracking Saccades: Impaired - to be further tested in functional context Convergence: Impaired - to be further tested in functional context Visual Fields: Right visual field deficit;Impaired-to be further tested in functional context Diplopia Assessment: Disappears with one eye closed;Other (comment) (needs to be further assessed due to confusion) Perception  Perception: Impaired Inattention/Neglect: Does not attend to right visual field;Does not attend to right side of body Praxis Praxis: Impaired Praxis Impairment Details: Ideation;Initiation;Ideomotor;Motor planning Praxis-Other Comments: When instructed to lift leg to assess strength, he would lift his arms Cognition Overall Cognitive Status: Impaired/Different from baseline Arousal/Alertness: Awake/alert Orientation Level: Person Year: Other (Comment) (pt  did not respond when asked and answered with unrelated verbage) Month:  (Pt reports "I dont know") Day of Week: Incorrect Memory: Impaired Memory Impairment: Storage deficit Immediate Memory Recall:  (unable to recall 3/3 words due to confusion) Memory Recall Sock: Not able to recall Memory Recall Blue: Not able to recall Memory Recall Bed: Not able to recall Attention: Focused;Sustained Focused Attention: Impaired Focused Attention Impairment: Functional basic;Verbal basic Sustained Attention: Impaired Sustained Attention Impairment: Functional basic;Verbal basic Awareness: Impaired Awareness Impairment: Intellectual impairment;Emergent impairment;Anticipatory impairment Problem Solving: Impaired Problem Solving Impairment: Verbal basic;Functional basic Executive Function:  Reasoning;Sequencing;Decision Making;Initiating;Self Monitoring;Self Correcting Reasoning: Impaired Reasoning Impairment: Verbal basic;Functional basic Sequencing: Impaired Sequencing Impairment: Verbal basic;Functional basic Decision Making: Impaired Decision Making Impairment: Verbal basic;Functional basic Initiating: Impaired Initiating Impairment: Verbal basic;Functional basic Self Monitoring: Impaired Self Monitoring Impairment: Verbal basic;Functional basic Self Correcting: Impaired Self Correcting Impairment: Verbal basic;Functional basic Behaviors: Impulsive Safety/Judgment: Impaired Sensation Sensation Light Touch: Impaired by gross assessment Hot/Cold: Not tested Proprioception: Impaired by gross assessment Stereognosis: Not tested Additional Comments: Difficult to assess due to cognitive deficits Coordination Gross Motor Movements are Fluid and Coordinated: No Fine Motor Movements are Fluid and Coordinated: No Coordination and Movement Description: Slowed movement patterns impacted by delayed processing and initiation Finger Nose Finger Test: Pt reports "I dont want to put myself out there like that" when therapist attempted to assess; UTA secondary to impaired cognition Motor  Motor Motor: Motor apraxia;Hemiplegia Motor - Skilled Clinical Observations: R hemi  Trunk/Postural Assessment  Cervical Assessment Cervical Assessment: Within Functional Limits Thoracic Assessment Thoracic Assessment: Within Functional Limits Lumbar Assessment Lumbar Assessment: Within Functional Limits Postural Control Postural Control: Deficits on evaluation Righting Reactions: Delayed Protective Responses: Delayed  Balance Balance Balance Assessed: Yes Static Sitting Balance Static Sitting - Balance Support: Feet supported;No upper extremity supported Static Sitting - Level of Assistance: 4: Min assist Dynamic Sitting Balance Dynamic Sitting - Balance Support: During functional  activity;Feet supported;No upper extremity supported Dynamic Sitting - Level of Assistance: 3: Mod assist Static Standing Balance Static Standing - Balance Support: No upper extremity supported Static Standing - Level of Assistance: 3: Mod assist Dynamic Standing Balance Dynamic Standing - Balance Support: During functional activity;No upper extremity supported Dynamic Standing - Level of Assistance: 3: Mod assist Extremity/Trunk Assessment RUE Assessment Passive Range of Motion (PROM) Comments: WNL Active Range of Motion (AROM) Comments: WNL General Strength Comments: 3+/5 however unsure if full effort was given secondary to impaired cognition LUE Assessment LUE Assessment: Exceptions to [facility] Passive Range of Motion (PROM) Comments: WNL Active Range of Motion (AROM) Comments: WNL General Strength Comments: 3+/5 however unsure if full effort was given secondary to impaired cognition  Care Tool Care Tool Self Care Eating        Oral Care  Oral care, brush teeth, clean dentures activity did not occur: Refused      Bathing         Assist Level: Moderate Assistance - Patient 50 - 74%    Upper Body Dressing(including orthotics)   What is the patient wearing?: Pull over shirt   Assist Level: Moderate Assistance - Patient 50 - 74%    Lower Body Dressing (excluding footwear)   What is the patient wearing?: Underwear/pull up Assist for lower body dressing: Maximal Assistance - Patient 25 - 49%    Putting on/Taking off footwear   What is the patient wearing?: Non-skid slipper socks Assist for footwear: Maximal Assistance - Patient 25 - 49%       Care  Tool Toileting Toileting activity   Assist for toileting: Total Assistance - Patient < 25%     Care Tool Bed Mobility Roll left and right activity        Sit to lying activity        Lying to sitting edge of bed activity         Care Tool Transfers Sit to stand transfer        Chair/bed transfer         Toilet  transfer   Assist Level: Maximal Assistance - Patient 24 - 49%     Care Tool Cognition Expression of Ideas and Wants Expression of Ideas and Wants: Frequent difficulty - frequently exhibits difficulty with expressing needs and ideas   Understanding Verbal and Non-Verbal Content Understanding Verbal and Non-Verbal Content: Sometimes understands - understands only basic conversations or simple, direct phrases. Frequently requires cues to understand   Memory/Recall Ability *first 3 days only Memory/Recall Ability *first 3 days only: None of the above were recalled    Refer to Care Plan for Long Term Goals  SHORT TERM GOAL WEEK 1 OT Short Term Goal 1 (Week 1): Pt will donn pants with min assist OT Short Term Goal 2 (Week 1): Pt will complete toilet transfer with min assist OT Short Term Goal 3 (Week 1): Pt will bathe UB with supervision OT Short Term Goal 4 (Week 1): Pt will bathe LB with mod assist.  Recommendations for other services: None    Skilled Therapeutic Intervention ADL ADL Lower Body Dressing: Maximal assistance Where Assessed-Lower Body Dressing: Bed level Toileting: Dependent Where Assessed-Toileting: Bed level Mobility  Bed Mobility Bed Mobility: Rolling Right;Rolling Left;Supine to Sit;Sit to Supine Rolling Right: Minimal Assistance - Patient > 75% Rolling Left: Minimal Assistance - Patient > 75% Supine to Sit: Moderate Assistance - Patient 50-74% Sit to Supine: Moderate Assistance - Patient 50-74% Transfers Sit to Stand: Moderate Assistance - Patient 50-74% Stand to Sit: Moderate Assistance - Patient 50-74%   Discharge Criteria: Patient will be discharged from OT if patient refuses treatment 3 consecutive times without medical reason, if treatment goals not met, if there is a change in medical status, if patient makes no progress towards goals or if patient is discharged from hospital.  The above assessment, treatment plan, treatment alternatives and goals were  discussed and mutually agreed upon: by patient and by family  Ezekiel Slocumb 03/20/2021, 4:21 PM

## 2021-03-20 NOTE — Progress Notes (Signed)
Inpatient Rehabilitation  Patient information reviewed and entered into eRehab system by Latroy Gaymon Aztlan Coll, OTR/L.   Information including medical coding, functional ability and quality indicators will be reviewed and updated through discharge.    

## 2021-03-20 NOTE — Discharge Instructions (Addendum)
Inpatient Rehab Discharge Instructions  Maruice Pieroni Discharge date and time: No discharge date for patient encounter.   Activities/Precautions/ Functional Status: Activity: activity as tolerated Diet: regular diet Wound Care: Routine skin checks Functional status:  ___ No restrictions     ___ Walk up steps independently ___ 24/7 supervision/assistance   ___ Walk up steps with assistance ___ Intermittent supervision/assistance  ___ Bathe/dress independently ___ Walk with walker     _x__ Bathe/dress with assistance ___ Walk Independently    ___ Shower independently ___ Walk with assistance    ___ Shower with assistance ___ No alcohol     ___ Return to work/school ________  Special Instructions:  No driving smoking or alcohol    COMMUNITY REFERRALS UPON DISCHARGE:    Outpatient: PT, OT, SP             Agency:[facility] [phone]              Appointment Date/Time:WILL CALL WIFE TO SET UP APPOINTMENTS  [phone] PT  Medical Equipment/Items Ordered:SISTER TO GET TUB SEAT                                                 Agency/Supplier:NA  MATCH FOR MEDICATION ASSISTANCE   My questions have been answered and I understand these instructions. I will adhere to these goals and the provided educational materials after my discharge from the hospital.  Patient/Caregiver Signature _______________________________ Date __________  Clinician Signature _______________________________________ Date __________  Please bring this form and your medication list with you to all your follow-up doctor's appointments.

## 2021-03-20 NOTE — Progress Notes (Signed)
Patient awake most of the night. Restless with periods of agitation. Posey belt intact, however pt frequently throwing legs across siderails and turning self around in bed. Pulled off condom cath. Responds well to one to one most of the time, however periods where he is difficult to redirect. Confused, alert and oriented to self. Bed mats intact, telesitter initiated for closer monitoring. Dan-PA notified of restless and anxious behavior, ativan 1 mg po ordered x 1. Medication given as ordered with minimal effect. Requiring frequent redirection and reorientation. Refused labs this am, this nurse instructed phlebotomy to reattempt later in am while wife is in attendance.

## 2021-03-20 NOTE — Progress Notes (Signed)
PROGRESS NOTE   Subjective/Complaints: Casey Branch is working with Casey Branch- impaired vision and ability to follow commands, Aox1 to self only. Very pleasant- nonagitated this morning. Wife and sister at bedside. Will trial melotonin to night to help reset circadian rhythm  ROS: +insomnia as per nursing and wife   Objective:   No results found. Recent Labs    03/18/21 0603  WBC 10.1  HGB 13.4  HCT 38.4*  PLT 230   Recent Labs    03/17/21 1957 03/18/21 0603  NA 136 136  K 3.9 3.7  CL 107 100  CO2 22 23  GLUCOSE 107* 100*  BUN 16 14  CREATININE 0.80 0.81  CALCIUM 8.4* 8.6*    Intake/Output Summary (Last 24 hours) at 03/20/2021 0956 Last data filed at 03/20/2021 0720 Gross per 24 hour  Intake 308 ml  Output 300 ml  Net 8 ml        Physical Exam: Vital Signs Blood pressure 118/82, pulse 64, temperature 98.3 F (36.8 C), temperature source Oral, resp. rate 16, height 5\' 10"  (1.778 m), weight 72.3 kg, SpO2 99 %. Gen: no distress, normal appearing HEENT: oral mucosa pink and moist, Right sided visual neglect. Visual acuity intact on left side.  Cardio: Reg rate Chest: normal effort, normal rate of breathing Abd: soft, non-distended Ext: no edema Psych: Patient is somnolent mood is flat.  Displays impaired initiation and attention.  He was able to provide his name.  Some delay in response time.  Inconsistent to follow commands.  Skin: intact Neurologic: Cranial nerves II through XII intact, motor strength is 4/5 in left deltoid, bicep, tricep, grip, hip flexor, knee extensors, ankle dorsiflexor and plantar flexor, trace right deltoid bicep tricep grip hip flexor knee extensor ankle dorsiflexor plantar flexor, patient is somnolent question level of effort Sensory exam patient indicates equal sensation to pinch bilateral fingers and toes. Cerebellar exam unable to cooperate Musculoskeletal: Full range of motion in  all 4 extremities. No joint swelling, no pain with active assisted range of motion.   Assessment/Plan: 1. Functional deficits which require 3+ hours per day of interdisciplinary therapy in a comprehensive inpatient rehab setting. Physiatrist is providing close team supervision and 24 hour management of active medical problems listed below. Physiatrist and rehab team continue to assess barriers to discharge/monitor patient progress toward functional and medical goals  Care Tool:  Bathing              Bathing assist       Upper Body Dressing/Undressing Upper body dressing        Upper body assist Assist Level: Moderate Assistance - Patient 50 - 74%    Lower Body Dressing/Undressing Lower body dressing            Lower body assist       Toileting Toileting    Toileting assist Assist for toileting: Moderate Assistance - Patient 50 - 74% (condom)     Transfers Chair/bed transfer  Transfers assist           Locomotion Ambulation   Ambulation assist              Walk 10 feet activity  Assist           Walk 50 feet activity   Assist           Walk 150 feet activity   Assist           Walk 10 feet on uneven surface  activity   Assist           Wheelchair     Assist               Wheelchair 50 feet with 2 turns activity    Assist            Wheelchair 150 feet activity     Assist          Blood pressure 118/82, pulse 64, temperature 98.3 F (36.8 C), temperature source Oral, resp. rate 16, height 5\' 10"  (1.778 m), weight 72.3 kg, SpO2 99 %.    Medical Problem List and Plan: 1.  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  Status post EVD which was removed 03/13/2021             -patient may  shower             -Casey Branch: 21-24d  -wife works from home, to  take 03/15/2021- advised to give forms to SW and we will help with this.  -patient was working prior  -has 41 year old child at home  -sister and wife both supportive, at bedside, will have 24/7 supervision at home.  2.  Antithrombotics: Subcutaneous heparin initiated 03/10/2021.  No plan for long-term anticoagulation -DVT/anticoagulation: Superficial vein thrombosis right upper extremity involving right cephalic vein age indeterminant DVT right axillary vein.  Lower extremity Dopplers negative             -antiplatelet therapy: N/A 3. Pain Management: Casey Branch as needed 4. Insomnia: start Casey Branch 3mg  HS. Therapeutic grounds pass for time outside during the day to help reset circadian rhythm.  5. Neuropsych: This patient is not capable of making decisions on his own behalf. 6. Skin/Wound Care: Routine skin checks 7. Fluids/Electrolytes/Nutrition: Routine in and outs with follow-up chemistries 8.  Acute hypoxic respiratory failure.  Extubated 03/13/2021 9.  MSSA trach aspirate and group C strep.  7-day course antibiotics completed. 10.  Incidental findings COVID-19.  Airborne precautions completed 03/16/2021 11.  History of tobacco use.  Counseling 12. Right sided neglect: counseled on sitting on right side of patient to encourage him to look in this direction.  13. Agitation: labs stable 6/28. D/c IV and minimize lab draws. Wife may stay over at night if she would like to help calm patient  14. Elevated CBGs: Casey Branch. Likely reactive. D/c Ensure and replace with Casey Branch.  15. Disposition: estimated 21-24 days. HFU scheduled 05/02/21 9:20am    LOS: 1 days A FACE TO FACE EVALUATION WAS PERFORMED  7/28 Casey Branch 03/20/2021, 9:56 AM

## 2021-03-21 MED ORDER — MODAFINIL 100 MG PO TABS
100.0000 mg | ORAL_TABLET | Freq: Every day | ORAL | Status: DC
  Administered 2021-03-21 – 2021-03-26 (×6): 100 mg via ORAL
  Filled 2021-03-21 (×6): qty 1

## 2021-03-21 NOTE — IPOC Note (Signed)
Overall Plan of Care [facility]) Patient Details Name: Casey Branch MRN: [id number] DOB: Apr 27, 1981  Admitting Diagnosis: Intraventricular hemorrhage [facility])  Hospital Problems: Principal Problem:   Intraventricular hemorrhage (HCC) Active Problems:   ICH (intracerebral hemorrhage) (HCC)     Functional Problem List: Nursing Bladder, Medication Management, Safety, Nutrition, Endurance, Pain  PT Balance, Behavior, Endurance, Motor, Nutrition, Perception, Safety, Sensory, Skin Integrity  OT Balance, Perception, Behavior, Safety, Cognition, Sensory, Endurance, Skin Integrity, Vision, Motor, Pain  SLP Cognition, Safety, Behavior, Linguistic  TR         Basic ADL's: OT Eating, Grooming, Bathing, Dressing, Toileting     Advanced  ADL's: OT       Transfers: PT Bed Mobility, Bed to Chair, Customer service manager, Tub/Shower     Locomotion: PT Ambulation, Stairs     Additional Impairments: OT Fuctional Use of Upper Extremity (Pts wife reports he has been having difficulty using RUE funcitonally)  SLP Social Cognition   Social Interaction, Problem Solving, Memory, Attention, Awareness  TR      Anticipated Outcomes Item Anticipated Outcome  Self Feeding supervision  Swallowing  N/A   Basic self-care  Supervision UB; CGA LB  Toileting  min assist   Bathroom Transfers CGA  Bowel/Bladder  manage bowel with mod I and bladder with min assist  Transfers  CGA with LRAD  Locomotion  CGA with LRAD  Communication  supervision to [employer]  supervision to minA  Pain  at or below level 4  Safety/Judgment  maintain safety with cues/reminders   Therapy Plan: PT Intensity: Minimum of 1-2 x/day ,45 to 90 minutes PT Frequency: 5 out of 7 days PT Duration Estimated Length of Stay: 3 weeks OT Intensity: Minimum of 1-2 x/day, 45 to 90 minutes OT Frequency: 5 out of 7 days OT Duration/Estimated Length of Stay: 3 weeks SLP Intensity: Minumum of 1-2 x/day, 30 to 90 minutes SLP  Frequency: 3 to 5 out of 7 days SLP Duration/Estimated Length of Stay: 3 weeks   Due to the current state of emergency, patients may not be receiving their 3-hours of Medicare-mandated therapy.   Team Interventions: Nursing Interventions Bladder Management, Disease Management/Prevention, Medication Management, Discharge Planning, Dysphagia/Aspiration Precaution Training, Bowel Management, Patient/Family Education, Pain Management  PT interventions Ambulation/gait training, Discharge planning, Functional mobility training, Psychosocial support, Therapeutic Activities, Visual/perceptual remediation/compensation, Therapeutic Exercise, Wheelchair propulsion/positioning, Skin care/wound management, Neuromuscular re-education, Disease management/prevention, Warden/ranger, Cognitive remediation/compensation, DME/adaptive equipment instruction, Pain management, Splinting/orthotics, UE/LE Strength taining/ROM, UE/LE Coordination activities, Stair training, Patient/family education, Community reintegration  OT Interventions Warden/ranger, Discharge planning, Functional electrical stimulation, Self Care/advanced ADL retraining, Therapeutic Activities, UE/LE Coordination activities, Pain management, Cognitive remediation/compensation, Disease mangement/prevention, Functional mobility training, Patient/family education, Skin care/wound managment, Therapeutic Exercise, Visual/perceptual remediation/compensation, Neuromuscular re-education, Psychosocial support, DME/adaptive equipment instruction, UE/LE Strength taining/ROM, Wheelchair propulsion/positioning  SLP Interventions Cognitive remediation/compensation, Internal/external aids, Speech/Language facilitation, Cueing hierarchy, Functional tasks, Patient/family education  TR Interventions    SW/CM Interventions Discharge Planning, Psychosocial Support, Patient/Family Education, Disease Management/Prevention   Barriers to Discharge MD   Medical stability  Nursing Decreased caregiver support, Incontinence 1 level home with spouse  PT Insurance for SNF coverage, Behavior, Weight, Nutrition means, Incontinence    OT      SLP      SW Other (comments), Decreased caregiver support, Neurogenic Bowel & Bladder, Insurance for SNF coverage, 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[employer], Contractor   Team Discharge Planning: Destination: PT-Home ,OT- Home , SLP-Home Projected Follow-up: PT-24 hour supervision/assistance, Home health  PT, Other (comment) (may progress to OPPT pending progress), OT-  24 hour supervision/assistance, Outpatient OT, Home health OT, SLP-Outpatient SLP, Home Health SLP, Other (comment) (Outpatient but HH if unable to get Outpatient) Projected Equipment Needs: PT-To be determined, OT- To be determined, SLP-None recommended by SLP Equipment Details: PT- , OT-Pts sister has a RW and elevated toilet seat with arm rests that pt can borrow per her report Patient/family involved in discharge planning: PT- Patient, Family Adult nurse,  OT-Family Adult nurse, Patient, SLP-Family member/caregiver  MD ELOS: 21-24d Medical Rehab Prognosis:  Fair Assessment: 40 year old right-handed male with past medical history significant for tobacco use, 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  Per chart review lives with spouse independent prior to admission.  Presented 03/08/2021 with altered mental status and right-sided weakness, with slurred speech nausea and vomiting of acute onset with left gaze preference.  There was a reported unwitnessed fall.  He was intubated for airway protection.  Admission chemistries unremarkable except potassium 3.4, glucose 138, hemoglobin 14.6, SARS coronavirus positive.  Cranial CT scan showed large volume intraventricular hemorrhage primarily in the left lateral ventricle but also in the right ventricle and third ventricle.  Increased density and mild increase in thickness  in the tentorium bilaterally possibly with layering of blood.  Mild hydrocephalus.  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  No cerebral aneurysm.  He was initially placed on 3% hypertonic saline.  Echocardiogram with ejection fraction of 50 to 55% no wall motion abnormalities.  Patient underwent right frontal ventriculostomy 03/08/2021 per Dr. Johnsie Cancel followed by diagnostic cerebral angiogram per Dr. 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  Venous Doppler right upper extremity showed a small acute superficial vein thrombosis involving the right cephalic vein and age-indeterminate DVT right axillary vein.  Patient was cleared to begin subcutaneous heparin for DVT prophylaxis and would not need long-term anticoagulation.  Venous Doppler studies negative.  Patient did complete a 7-day course of antibiotics for MSSA trach aspirate and group C strep.  In regards to patient's incidental finding of COVID patient was treated airborne precautions until 03/16/2021 and discontinued.  Patient did initially have bouts of agitation restlessness maintained on low-dose Precedex.  Maintained on a regular consistency diet.  Therapy evaluations completed due to patient decreased functional mobility was admitted for a comprehensive rehab program.      See Team Conference Notes for weekly updates to the plan of care

## 2021-03-21 NOTE — Progress Notes (Signed)
Speech Language Pathology Daily Session Note  Patient Details  Name: Casey Branch MRN: [id number] Date of Birth: [birth year]  Today's Date: 03/21/2021 SLP Individual Time: 1315-1400 SLP Individual Time Calculation (min): 45 min  Short Term Goals: Week 1: SLP Short Term Goal 1 (Week 1): Patient will maintain attention to basic level functional tasks for increments of 2-3 minutes with maxA cues. SLP Short Term Goal 2 (Week 1): Patient will perform familiar functional ADL's (brush teeth, hair, etc) with modA cues for accuracy. SLP Short Term Goal 3 (Week 1): Patient will point to identify objects/object pictures or photos in field of two with modA cues for at least 60% accuracy. SLP Short Term Goal 4 (Week 1): Patient will name at least 10 different common objects with mod-maxA. SLP Short Term Goal 5 (Week 1): Patient will match object to picture/photo in field of 2-3 with modA cues.  Skilled Therapeutic Interventions:   Patient seen for skilled ST session focusing on cognitive-linguistic goals. Patient initially was not able to be aroused for therapy and so SLP left room after multiple attempts. Shortly after, SLP observed patient to be awake in bed and so he was then able to participate in session. Patient was difficult to engage and direct attention, requiring max-total A cues to initiate and perform basic tasks. He was not able to name any objects, but did demonstrate appropriate use of one  (ball) and was able to describe basic function when SLP cued (for 'cup') 'what could you put in it?' To which patient replied, "put juice in there". Many times when SLP trying to get patient to focus on an object, his eye gaze would be directed in various directions but not on object. SLP set patient up with oral care items and instructed him to brush his teeth. He held toothbrush in hand but would not initiate movement to mouth. He then suddenly called out to NT in hallway "excuse me ma'm" and "can you come over  here". When NT asked him what he wanted, he would just point to toothbrush. After verbal and tactile cues, patient then did brush teeth. Patient continues to benefit from skilled SLP intervention to maximize cognitive-linguistic abilities prior to discharge.  Pain Pain Assessment Pain Scale: Faces Faces Pain Scale: No hurt  Therapy/Group: Individual Therapy  Angela Nevin, MA, CCC-SLP Speech Therapy

## 2021-03-21 NOTE — Progress Notes (Signed)
Physical Therapy Session Note  Patient Details  Name: Casey Branch MRN: [id number] Date of Birth: [birth year]  Today's Date: 03/21/2021 PT Individual Time: 0800-0900 PT Individual Time Calculation (min): 60 min   Short Term Goals: Week 1:  PT Short Term Goal 1 (Week 1): Pt will comlete bed mobility with minA PT Short Term Goal 2 (Week 1): Pt will complete bed<>chair transfers with minA and LRAD PT Short Term Goal 3 (Week 1): Pt will ambulate 155ft with minA and LRAD PT Short Term Goal 4 (Week 1): Pt will show improved signs of safety awareness and decreased impulsivity during therapies  Skilled Therapeutic Interventions/Progress Updates:     Pt received supine in bed to start session. He was sleeping soundly and awakens to voice but immediately falls back asleep. Retrieved a 20x20 TIS w/c to allow improved sitting tolerance and safety which appeared to improve his positioning and comfort as well. Pt donned pajama pants while supine in bed with modA - able to fully bridge hips to pull pants up. Supine<>sit with modA for trunk elevation. Continues to demonstrate delayed intiation and processing. Sitting balance with CGA while unsupported at EOB. He required modA for stand<>pivot transfer to TIS w/c with poor safety awareness as he attempted to sit prematurely. Flat affect throughout session with minimal verbalizations - encouraged him to use his words to allow staff to help. W/c transport for time management to main reha gym and completed stand<>pivot transfer with modA to mat table - improved safety awareness during this transfer. Upon sitting on mat table, pt impulsively attempting to lay down without warning. This occurred several times during our session, at least 8 times. Pt unable to describe reasoning. BP assessed to rule out hypotension but BP within normal limits. Gait training 66ft with modA via HHA - demo's short shuffling steps with significant unsteadiness present, mild ataxia with very  narrow BOS, R lateral lean. With sitting rest break on mat table, continued to attempt to lay down impulsively. Instructed on card matching to back of mirror - required mod cues for accuracy and min cues for instruction - modA for standing balance while he completed this. MD arriving for morning rounds - informed MD of lethargy and impulsivity. Pt completed squat<>pivot transfer with minA back to his TIS w/c and wheeled back to his room. Sister at bedside and informed of pt's mobility tolerance this session. He remained reclined in TIS w/c with safety belt alarm on, sister remaining at bedside, all needs within reach.  Therapy Documentation Precautions:  Precautions Precautions: Fall Precaution Comments: R inattention, L gaze preference, Impulsive Restrictions Weight Bearing Restrictions: No General:     Therapy/Group: Individual Therapy  Charmian Forbis P Mell Mellott PT 03/21/2021, 7:26 AM

## 2021-03-21 NOTE — Progress Notes (Signed)
Received phone call from family members with questions regarding the reason for the patient to be on heparin, and why the patients melatonin was scheduled and not put in as PRN. Will let the provider know. Family also requested for the doctor to call the wife during morning rounds as the brother will be at bedside. They just want everybody to get on the same page.

## 2021-03-21 NOTE — Progress Notes (Signed)
PROGRESS NOTE   Subjective/Complaints:  Pt standing with PT working in reaching for cards , fatigues easily   ROS: unable to obtain due to cognition   Objective:   No results found. No results for input(s): WBC, HGB, HCT, PLT in the last 72 hours.  No results for input(s): NA, K, CL, CO2, GLUCOSE, BUN, CREATININE, CALCIUM in the last 72 hours.   Intake/Output Summary (Last 24 hours) at 03/21/2021 0837 Last data filed at 03/21/2021 0757 Gross per 24 hour  Intake 640 ml  Output --  Net 640 ml         Physical Exam: Vital Signs Blood pressure 112/77, pulse (!) 59, temperature 98 F (36.7 C), temperature source Oral, resp. rate 16, height 5\' 10"  (1.778 m), weight 72.3 kg, SpO2 99 %.  General: No acute distress Mood and affect are appropriate Heart: Regular rate and rhythm no rubs murmurs or extra sounds Lungs: Clear to auscultation, breathing unlabored, no rales or wheezes Abdomen: Positive bowel sounds, soft nontender to palpation, nondistended Extremities: No clubbing, cyanosis, or edema Skin: intact Neurologic: Cranial nerves II through XII intact, motor strength is 5/5 in left and 4/5 RIgh tdeltoid, bicep, tricep, grip, ,  INO bilateral R>L Very fatigued in PT  Cerebellar exam unable to cooperate Musculoskeletal: Full range of motion in all 4 extremities. No joint swelling, no pain with active assisted range of motion.   Assessment/Plan: 1. Functional deficits which require 3+ hours per day of interdisciplinary therapy in a comprehensive inpatient rehab setting. Physiatrist is providing close team supervision and 24 hour management of active medical problems listed below. Physiatrist and rehab team continue to assess barriers to discharge/monitor patient progress toward functional and medical goals  Care Tool:  Bathing              Bathing assist Assist Level: Moderate Assistance - Patient 50 - 74%      Upper Body Dressing/Undressing Upper body dressing   What is the patient wearing?: Pull over shirt    Upper body assist Assist Level: Moderate Assistance - Patient 50 - 74%    Lower Body Dressing/Undressing Lower body dressing      What is the patient wearing?: Underwear/pull up     Lower body assist Assist for lower body dressing: Maximal Assistance - Patient 25 - 49%     Toileting Toileting    Toileting assist Assist for toileting: Total Assistance - Patient < 25%     Transfers Chair/bed transfer  Transfers assist     Chair/bed transfer assist level: Moderate Assistance - Patient 50 - 74%     Locomotion Ambulation   Ambulation assist      Assist level: Moderate Assistance - Patient 50 - 74% Assistive device: Walker-rolling Max distance: 56ft   Walk 10 feet activity   Assist     Assist level: Moderate Assistance - Patient - 50 - 74% Assistive device: Walker-rolling   Walk 50 feet activity   Assist    Assist level: Moderate Assistance - Patient - 50 - 74% Assistive device: Walker-rolling    Walk 150 feet activity   Assist Walk 150 feet activity did not occur: Safety/medical concerns (fatigue)  Walk 10 feet on uneven surface  activity   Assist Walk 10 feet on uneven surfaces activity did not occur: Safety/medical concerns         Wheelchair     Assist Will patient use wheelchair at discharge?: No   Wheelchair activity did not occur: N/A         Wheelchair 50 feet with 2 turns activity    Assist    Wheelchair 50 feet with 2 turns activity did not occur: N/A       Wheelchair 150 feet activity     Assist  Wheelchair 150 feet activity did not occur: N/A       Blood pressure 112/77, pulse (!) 59, temperature 98 F (36.7 C), temperature source Oral, resp. rate 16, height 5\' 10"  (1.778 m), weight 72.3 kg, SpO2 99 %.    Medical Problem List and Plan: 1.  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  -patient was working prior  -has 34 year old child at home  -sister and wife both supportive, at bedside, will have 24/7 supervision at home.  2.  Antithrombotics: Subcutaneous heparin initiated 03/10/2021.  No plan for long-term anticoagulation -DVT/anticoagulation: Superficial vein thrombosis right upper extremity involving right cephalic vein age indeterminant DVT right axillary vein.  Lower extremity Dopplers negative             -antiplatelet therapy: N/A 3. Pain Management: Tylenol as needed 4. Insomnia: start melatonin 3mg  HS. Therapeutic grounds pass for time outside during the day to help reset circadian rhythm.  5. Neuropsych: This patient is not capable of making decisions on his own behalf. 6. Skin/Wound Care: Routine skin checks 7. Fluids/Electrolytes/Nutrition: Routine in and outs with follow-up chemistries 8.  Acute hypoxic respiratory failure.  Extubated 03/13/2021 9.  MSSA trach aspirate and group C strep.  7-day course antibiotics completed. 10.  Incidental findings COVID-19.  Airborne precautions completed 03/16/2021 11.  History of tobacco use.  Counseling 12. Right sided neglect: counseled on sitting on right side of patient to encourage him to look in this direction.  13. Agitation: labs stable 6/28. D/c IV and minimize lab draws. Wife may stay over at night if she would like to help calm patient  14. Elevated CBGs: HgbA1c. Likely reactive. D/c Ensure and replace with Juven.  15. Disposition: estimated 21-24 days. HFU  scheduled 05/02/21 9:20am    LOS: 2 days A FACE TO FACE EVALUATION WAS PERFORMED  7/28 03/21/2021, 8:37 AM

## 2021-03-21 NOTE — Progress Notes (Signed)
Occupational Therapy Session Note  Patient Details  Name: Casey Branch MRN: [id number] Date of Birth: 1980/11/13  Today's Date: 03/21/2021 OT Individual Time: 0900-1000 OT Individual Time Calculation (min): 60 min    Short Term Goals: Week 1:  OT Short Term Goal 1 (Week 1): Pt will donn pants with min assist OT Short Term Goal 2 (Week 1): Pt will complete toilet transfer with min assist OT Short Term Goal 3 (Week 1): Pt will bathe UB with supervision OT Short Term Goal 4 (Week 1): Pt will bathe LB with mod assist.  Skilled Therapeutic Interventions/Progress Updates:    Pt sitting up in TIS w/c, sister present at bedside and wife on phone via video for support and encouragement. No c/o pain throughout session. Pt required significant encouragement to initiate self care at sink but eventually agreeable.  Pt completed UB bathing/dressing/grooming sitting at sink with CGA and intermittent Vcs/visual cues needed for sequencing and visual scanning.  Pt refused LB self care stating "I want to take a nap".  Pt needing encouragement to complete table top visual scanning and visual perceptual task with BUE use.  Pt needing max multimodal cues also to attend to right side in order to look at template of PVC shape.  Pt appearing with low frustration tolerance to task and needing increased cues and assist (forward chaining) to successfully complete. Pts right eye unable to maintain rightward gaze and noted some nystagmus as well. Pt nodding head "yes" when asked if it was difficult to see picture and pvc parts.  Pt did not respond to therapist when asked to describe visual deficits.  Pt indicating he would like to go back to bed.  Mod assist SPT w/c to EOB and CGA sit to supine.  Posey belt alarm on, bed alarm on, telesitter on, floor mats in place, call bell in reach at end of session.      Therapy Documentation Precautions:  Precautions Precautions: Fall Precaution Comments: R inattention, L gaze  preference, Impulsive Restrictions Weight Bearing Restrictions: No    Therapy/Group: Individual Therapy  Amie Critchley 03/21/2021, 12:13 PM

## 2021-03-22 DIAGNOSIS — G479 Sleep disorder, unspecified: Secondary | ICD-10-CM

## 2021-03-22 MED ORDER — MELATONIN 3 MG PO TABS: 3.0000 mg | ORAL_TABLET | Freq: Every evening | ORAL | Status: DC | PRN

## 2021-03-22 MED ORDER — SORBITOL 70 % SOLN
60.0000 mL | Status: AC
  Administered 2021-03-22: 60 mL via ORAL
  Filled 2021-03-22: qty 60

## 2021-03-22 NOTE — Plan of Care (Signed)
  Problem: Safety: Goal: Non-violent Restraint(s) Outcome: Progressing   Problem: Consults Goal: RH STROKE PATIENT EDUCATION Description: See Patient Education module for education specifics  Outcome: Progressing   Problem: RH BOWEL ELIMINATION Goal: RH STG MANAGE BOWEL WITH ASSISTANCE Description: STG Manage Bowel with mod I Assistance. Outcome: Progressing   Problem: RH BLADDER ELIMINATION Goal: RH STG MANAGE BLADDER WITH ASSISTANCE Description: STG Manage Bladder With min Assistance Outcome: Progressing   Problem: RH SAFETY Goal: RH STG ADHERE TO SAFETY PRECAUTIONS W/ASSISTANCE/DEVICE Description: STG Adhere to Safety Precautions With cues/reminders Assistance/Device. Outcome: Progressing   Problem: RH COGNITION-NURSING Goal: RH STG USES MEMORY AIDS/STRATEGIES W/ASSIST TO PROBLEM SOLVE Description: STG Uses Memory Aids/Strategies With Assistance to Problem Solve with cues/reminders. Outcome: Progressing   Problem: RH PAIN MANAGEMENT Goal: RH STG PAIN MANAGED AT OR BELOW PT'S PAIN GOAL Description: At or below level 4 Outcome: Progressing

## 2021-03-22 NOTE — Progress Notes (Addendum)
PROGRESS NOTE   Subjective/Complaints:  Pt up in w/c. Tech working on attaching leg rests to his w/c. Pt denies any problems this morning.   ROS: Patient denies fever, rash, sore throat, blurred vision, nausea, vomiting, diarrhea, cough, shortness of breath or chest pain, joint or back pain, headache, or mood change.    Objective:   No results found. No results for input(s): WBC, HGB, HCT, PLT in the last 72 hours.  No results for input(s): NA, K, CL, CO2, GLUCOSE, BUN, CREATININE, CALCIUM in the last 72 hours.   Intake/Output Summary (Last 24 hours) at 03/22/2021 1410 Last data filed at 03/21/2021 1829 Gross per 24 hour  Intake 240 ml  Output --  Net 240 ml        Physical Exam: Vital Signs Blood pressure 122/81, pulse 64, temperature 97.6 F (36.4 C), temperature source Oral, resp. rate 16, height 5\' 10"  (1.778 m), weight 72.3 kg, SpO2 99 %.  Constitutional: No distress . Vital signs reviewed. HEENT: EOMI, oral membranes moist Neck: supple Cardiovascular: RRR without murmur. No JVD    Respiratory/Chest: CTA Bilaterally without wheezes or rales. Normal effort    GI/Abdomen: BS +, non-tender, non-distended Ext: no clubbing, cyanosis, or edema Psych: pleasant and cooperative Neurologic: Cranial nerves II through XII intact, motor strength is 5/5 in left and 4/5 RIgh tdeltoid, bicep, tricep, grip, ,  INO bilateral R>L Very fatigued in PT  Musculoskeletal: Full range of motion in all 4 extremities. No joint swelling, no pain with active assisted range of motion.   Assessment/Plan: 1. Functional deficits which require 3+ hours per day of interdisciplinary therapy in a comprehensive inpatient rehab setting. Physiatrist is providing close team supervision and 24 hour management of active medical problems listed below. Physiatrist and rehab team continue to assess barriers to discharge/monitor patient progress toward  functional and medical goals  Care Tool:  Bathing    Body parts bathed by patient: Left arm, Right arm, Chest, Abdomen, Face     Body parts n/a: Front perineal area, Buttocks, Right upper leg, Left upper leg, Right lower leg, Left lower leg   Bathing assist Assist Level: Contact Guard/Touching assist     Upper Body Dressing/Undressing Upper body dressing   What is the patient wearing?: Pull over shirt    Upper body assist Assist Level: Supervision/Verbal cueing    Lower Body Dressing/Undressing Lower body dressing      What is the patient wearing?: Pants     Lower body assist Assist for lower body dressing: Total Assistance - Patient < 25%     Toileting Toileting    Toileting assist Assist for toileting: Total Assistance - Patient < 25%     Transfers Chair/bed transfer  Transfers assist     Chair/bed transfer assist level: Minimal Assistance - Patient > 75%     Locomotion Ambulation   Ambulation assist      Assist level: Moderate Assistance - Patient 50 - 74% Assistive device: No Device Max distance: 97ft   Walk 10 feet activity   Assist     Assist level: Moderate Assistance - Patient - 50 - 74% Assistive device: No Device   Walk 50  feet activity   Assist    Assist level: Moderate Assistance - Patient - 50 - 74% Assistive device: No Device    Walk 150 feet activity   Assist Walk 150 feet activity did not occur: Safety/medical concerns (fatigue)         Walk 10 feet on uneven surface  activity   Assist Walk 10 feet on uneven surfaces activity did not occur: Safety/medical concerns         Wheelchair     Assist Will patient use wheelchair at discharge?: No   Wheelchair activity did not occur: N/A         Wheelchair 50 feet with 2 turns activity    Assist    Wheelchair 50 feet with 2 turns activity did not occur: N/A       Wheelchair 150 feet activity     Assist  Wheelchair 150 feet activity did  not occur: N/A       Blood pressure 122/81, pulse 64, temperature 97.6 F (36.4 C), temperature source Oral, resp. rate 16, height 5\' 10"  (1.778 m), weight 72.3 kg, SpO2 99 %.    Medical Problem List and Plan: 1.  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  -patient was working prior  -has 92 year old child at home  -sister and wife both supportive, at bedside, will have 24/7 supervision at home.  2.  Antithrombotics: Subcutaneous heparin initiated 03/10/2021.  No plan for long-term anticoagulation -DVT/anticoagulation: Superficial vein thrombosis right upper extremity involving right cephalic vein age indeterminant DVT right axillary vein.  Lower extremity Dopplers negative -wife asked about safety of heparin. He is receiving prophylactic dose only             -antiplatelet therapy: N/A 3. Pain Management: Tylenol as needed 4. Insomnia: grounds pass for day/night rhythm  -change melatonin to prn as wife feels it's making him groggy in the morning 5. Neuropsych: This patient is not capable of making decisions on his own behalf. 6. Skin/Wound Care: Routine skin checks 7. Fluids/Electrolytes/Nutrition: Routine in and outs with follow-up chemistries 8.  Acute hypoxic respiratory failure.  Extubated 03/13/2021 9.  MSSA trach aspirate and group C strep.  7-day course antibiotics completed. 10.  Incidental findings COVID-19.  Airborne precautions completed 03/16/2021 11.  History of tobacco use.  Counseling 12. Right sided neglect:  counseled on sitting on right side of patient to encourage him to look in this direction.  13. Agitation: labs stable 6/28. D/c IV and minimize lab draws. Wife may stay over at night if she would like to help calm patient  14. Elevated CBGs: HgbA1c. Likely reactive. D/c Ensure and replace with Juven.  15. Constipation: no bm since 6/26----sorbitol today  Disposition: estimated 21-24 days. HFU scheduled 05/02/21 9:20am    LOS: 3 days A FACE TO FACE EVALUATION WAS PERFORMED  07/02/21 03/22/2021, 2:10 PM

## 2021-03-22 NOTE — Progress Notes (Signed)
Occupational Therapy Session Note  Patient Details  Name: Casey Branch MRN: [id number] Date of Birth: 1981-04-19  Today's Date: 03/22/2021 OT Individual Time: 4383-8184 OT Individual Time Calculation (min): 55 min   Short Term Goals: Week 1:  OT Short Term Goal 1 (Week 1): Pt will donn pants with min assist OT Short Term Goal 2 (Week 1): Pt will complete toilet transfer with min assist OT Short Term Goal 3 (Week 1): Pt will bathe UB with supervision OT Short Term Goal 4 (Week 1): Pt will bathe LB with mod assist.  Skilled Therapeutic Interventions/Progress Updates:    Pt greeted in the TIS, brother present, stating he was going to leave to bring pt some lunch and then return. Pt declined participating in ADLs and toileting during session. Agreeable to sit outside for a bit because he likes being outdoors. Also informed OT that he liked word find puzzles. Escorted him via w/c to the outdoor patio and attempted to engage pt in word find activities to work on Rt attention/visual scanning. Pt very difficult to engage, was easily frustrated and noted that he would close his Lt eye to see. Downgraded activity to therapist highlighting the first letter of the word he needed to find and then having pt figure the rest of the word out from there, however pt required Total A to participate in the activity. Therefore worked on Rt scanning in a natural stimulating environment, max cues for locating trees/flowers on his Rt side, the same cuing for locating items on his Rt side in the gift shop. Noted 50% accuracy when naming items that OT was pointing to. Also noted pt with increasing fatigue at this point, often shifting in his chair and adjusting his socks. He required frequent cues throughout session to scoot himself back in the w/c. Very impulsive with sitting up and readjusting himself. He was returned to the room and agreeable to be set up with a coloring task, stated he liked coloring. However when OT set  him up pt stated he didn't want to do it. Refused to attempt. Pt perseverating on returning to bed, OT reminded him multiple times that his brother was arriving shortly to bring him lunch. He remained sitting up in the TIS, reclined for comfort and safety, call bell within reach, and safety belt fastened.   Note that pt was disoriented x4 during tx  Therapy Documentation Precautions:  Precautions Precautions: Fall Precaution Comments: R inattention, L gaze preference, Impulsive Restrictions Weight Bearing Restrictions: No   Pain: no s/s pain during tx   ADL: ADL Lower Body Dressing: Maximal assistance Where Assessed-Lower Body Dressing: Bed level Toileting: Dependent Where Assessed-Toileting: Bed level    Therapy/Group: Individual Therapy  [name] [name] 03/22/2021, 12:50 PM

## 2021-03-22 NOTE — Progress Notes (Signed)
Physical Therapy Session Note  Patient Details  Name: Casey Branch MRN: [id number] Date of Birth: 1981-02-02  Today's Date: 03/22/2021 PT Individual Time: 3875-6433 + 2951-8841 PT Individual Time Calculation (min): 57 min  + 42 min  Short Term Goals: Week 1:  PT Short Term Goal 1 (Week 1): Pt will comlete bed mobility with minA PT Short Term Goal 2 (Week 1): Pt will complete bed<>chair transfers with minA and LRAD PT Short Term Goal 3 (Week 1): Pt will ambulate 145ft with minA and LRAD PT Short Term Goal 4 (Week 1): Pt will show improved signs of safety awareness and decreased impulsivity during therapies  Skilled Therapeutic Interventions/Progress Updates:     1st session: Pt received supine in bed sleeping. He awakens to voice but immediately closes his eyes. Multiple efforts at maintaining adequate alertness to participate and then pt began to refuse participating. At this point, his brother arrived and provided encouragement and pt ultimately was agreeable but he did have multiple attempts to impulsively lay back down in bed. Supine<>sit completd with +2 modA as pt somewhat resistive. Spent 15 minutes to get edge of bed. Able to sit EOB with CGA. Stand<>pivot transfer completed with  modA +2 for safety to TIS w/c. Wheeled to ortho gym in [employer] w/c to [place] system. Pt instructed on completing trail making from #1-#8 and PT provided demonstration prior as pt had difficulty understanding instructions to start. Sit<>stand with minA from w/c height and min/modA for static standing balance due to R lateral lean as he worked on completing this puzzle. He required max cues for sequencing #1-#3 and min cues for sequencing #4-#6. Pt began to get very distracted from another therapist/patient working and began to sit down and said "I'm too distracted, I would do better if I could lock in." Pt completed car transfer with minA via stand<>pivot with mod cues for safety awareness. He slid himself over to the  drivers side and would attempt to lay down to sleep while in the car. Ultimately able to return to his TIS w/c out of the car via stand<>pivot with minA. Wheeled to day room rehab gym to reduce distractions and promote quite environment. Wheeled to high/low table. In standing with modA for balance, worked on [employer] and encouraged him to use his RUE but pt would get frustrated and return to using his LUE. Attempted to use "constraint induced" for LUE but pt resistive and deferred efforts for safety. Returned to sitting position to reduce multi-taking component. Worked on task attenuation with fine motor task for screws/bolts - again attempted to work on RUE but pt not compliant with this. He was returned to his room at end of session and remained reclined in TIS w/c with safety belt alarm on, brother at bedside, all needs within reach.   2nd session: Pt greeted reclined in TIS w/c on arrival. Wife at bedside and has plenty of reasonable questions and concerns regarding medications (heparin and melatonin) as well as pt's safety while sitting in the chair - she reports that when she entered the room, he was about to fall out of the chair and needed staff assist to reposition, reports that his safety belt alarm did not activate. Also reports that telesitter did not call for help either. Assessed alarm system with safety belt and this appeared to be working as it should. Also, relayed her concerns to the covering MD regarding his medications via secure chat. Wife appreciative. Wheeled pt to main rehab gym for time  management. Instructed on using resistive clothes pins while using only RUE (pins placed on R side to promote R attention) and place along vertical pole. Pt noted to initially use palmar grasp for gripping resisitve clothes pins and was able to problem solve to use pincher grasp to be more effective and accurate. Noted overshooting and undershooting while placing pins - likely related to visual  deficits but these are difficult to assess due to cognitive deficits. When asked how many "poles" he sees, he reports "2", but then begans to count the pins instead. Worked on functional gait training in rehab gym and from rehab gym to his room, requiring min/modA and HHA to R hand - gait deficits include R lateral lean, R knee flexed in stance, narrow BOS, and decreased bilateral step length/height. Ambulated a total of 11ft + 133ft. He ended session seated in recliner with safety belt alarm applied, telesitter on at bedside, and wife at bedside. All needs within reach. Updated wife on relaying communications to MD and she was appreciative.   Therapy Documentation Precautions:  Precautions Precautions: Fall Precaution Comments: R inattention, L gaze preference, Impulsive Restrictions Weight Bearing Restrictions: No General:    Therapy/Group: Individual Therapy  Timo Hartwig P Quantavius Humm PT 03/22/2021, 7:33 AM

## 2021-03-22 NOTE — Progress Notes (Signed)
Speech Language Pathology Daily Session Note  Patient Details  Name: Casey Branch MRN: [id number] Date of Birth: Feb 13, 1981  Today's Date: 03/22/2021 SLP Individual Time: 1450-1530 SLP Individual Time Calculation (min): 40 min  Short Term Goals: Week 1: SLP Short Term Goal 1 (Week 1): Patient will maintain attention to basic level functional tasks for increments of 2-3 minutes with maxA cues. SLP Short Term Goal 2 (Week 1): Patient will perform familiar functional ADL's (brush teeth, hair, etc) with modA cues for accuracy. SLP Short Term Goal 3 (Week 1): Patient will point to identify objects/object pictures or photos in field of two with modA cues for at least 60% accuracy. SLP Short Term Goal 4 (Week 1): Patient will name at least 10 different common objects with mod-maxA. SLP Short Term Goal 5 (Week 1): Patient will match object to picture/photo in field of 2-3 with modA cues.  Skilled Therapeutic Interventions:   Patient seen for skilled ST session in room with wife and brother present. Patient sitting up in recliner and was able to maintain alertness throughout session, with main difficulty being restlessness. Patient to visual focus and identify 7 different objects from [facility] box, at times requiring mod-maxA. Patient did not appear to consistently understand what was being asked of him, as he would seem to struggle with naming an object and then when SLP repeated, rephrased question, he would say, "oh this hairbrush?" Patient also exhibited perseveration without awareness, naming next object with name of previous. At one point, he pointed to tray table at other side of room and asked, "I wanted to know if that is the correct distance from me". SLP and patient's wife suspected he was trying to request to have the table brought closer so he could get to his drink. He struggles significantly with making specific requests and will instead point to/gesture to object without verbalizing. Patient then  assisted with transfer into bed. He continues to benefit from skilled SLP intervention to maximize cognitive-linguistic function prior to discharge.  Pain Pain Assessment Pain Scale: Faces Faces Pain Scale: No hurt  Therapy/Group: Individual Therapy  Angela Nevin, MA, CCC-SLP Speech Therapy

## 2021-03-23 NOTE — Plan of Care (Signed)
  Problem: Safety: Goal: Non-violent Restraint(s) Outcome: Progressing   Problem: Consults Goal: RH STROKE PATIENT EDUCATION Description: See Patient Education module for education specifics  Outcome: Progressing   Problem: RH BOWEL ELIMINATION Goal: RH STG MANAGE BOWEL WITH ASSISTANCE Description: STG Manage Bowel with mod I Assistance. Outcome: Progressing   Problem: RH BLADDER ELIMINATION Goal: RH STG MANAGE BLADDER WITH ASSISTANCE Description: STG Manage Bladder With min Assistance Outcome: Progressing   Problem: RH SAFETY Goal: RH STG ADHERE TO SAFETY PRECAUTIONS W/ASSISTANCE/DEVICE Description: STG Adhere to Safety Precautions With cues/reminders Assistance/Device. Outcome: Progressing   Problem: RH COGNITION-NURSING Goal: RH STG USES MEMORY AIDS/STRATEGIES W/ASSIST TO PROBLEM SOLVE Description: STG Uses Memory Aids/Strategies With Assistance to Problem Solve with cues/reminders. Outcome: Progressing   Problem: RH PAIN MANAGEMENT Goal: RH STG PAIN MANAGED AT OR BELOW PT'S PAIN GOAL Description: At or below level 4 Outcome: Progressing   

## 2021-03-23 NOTE — Progress Notes (Signed)
PROGRESS NOTE   Subjective/Complaints:  Pt lying in bed watching tv. Denied any pain. Moved bowels yesterday.   ROS: Limited due to cognitive/behavioral    Objective:   No results found. No results for input(s): WBC, HGB, HCT, PLT in the last 72 hours.  No results for input(s): NA, K, CL, CO2, GLUCOSE, BUN, CREATININE, CALCIUM in the last 72 hours.   Intake/Output Summary (Last 24 hours) at 03/23/2021 1018 Last data filed at 03/23/2021 0825 Gross per 24 hour  Intake 360 ml  Output --  Net 360 ml        Physical Exam: Vital Signs Blood pressure 116/73, pulse 72, temperature 98.1 F (36.7 C), temperature source Oral, resp. rate 18, height 5\' 10"  (1.778 m), weight 72.3 kg, SpO2 100 %.  Constitutional: No distress . Vital signs reviewed. HEENT: EOMI, oral membranes moist Neck: supple Cardiovascular: RRR without murmur. No JVD    Respiratory/Chest: CTA Bilaterally without wheezes or rales. Normal effort    GI/Abdomen: BS +, non-tender, non-distended Ext: no clubbing, cyanosis, or edema Psych: flat, difficult to engage Neurologic: Cranial nerves II through XII intact, motor strength is 5/5 in left and 4/5 RIgh tdeltoid, bicep, tricep, grip, ,  INO bilateral R>L Fairly alert  Musculoskeletal: Full range of motion in all 4 extremities. No joint swelling, no pain with active assisted range of motion.   Assessment/Plan: 1. Functional deficits which require 3+ hours per day of interdisciplinary therapy in a comprehensive inpatient rehab setting. Physiatrist is providing close team supervision and 24 hour management of active medical problems listed below. Physiatrist and rehab team continue to assess barriers to discharge/monitor patient progress toward functional and medical goals  Care Tool:  Bathing    Body parts bathed by patient: Left arm, Right arm, Chest, Abdomen, Face     Body parts n/a: Front perineal area,  Buttocks, Right upper leg, Left upper leg, Right lower leg, Left lower leg   Bathing assist Assist Level: Contact Guard/Touching assist     Upper Body Dressing/Undressing Upper body dressing   What is the patient wearing?: Pull over shirt    Upper body assist Assist Level: Supervision/Verbal cueing    Lower Body Dressing/Undressing Lower body dressing      What is the patient wearing?: Pants     Lower body assist Assist for lower body dressing: Total Assistance - Patient < 25%     Toileting Toileting    Toileting assist Assist for toileting: Total Assistance - Patient < 25%     Transfers Chair/bed transfer  Transfers assist     Chair/bed transfer assist level: Moderate Assistance - Patient 50 - 74%     Locomotion Ambulation   Ambulation assist      Assist level: Moderate Assistance - Patient 50 - 74% Assistive device: Hand held assist (HHA) Max distance: 110ft   Walk 10 feet activity   Assist     Assist level: Moderate Assistance - Patient - 50 - 74% Assistive device: No Device, Hand held assist   Walk 50 feet activity   Assist    Assist level: Moderate Assistance - Patient - 50 - 74% Assistive device: Hand held assist  Walk 150 feet activity   Assist Walk 150 feet activity did not occur: Safety/medical concerns         Walk 10 feet on uneven surface  activity   Assist Walk 10 feet on uneven surfaces activity did not occur: Safety/medical concerns         Wheelchair     Assist Will patient use wheelchair at discharge?: No   Wheelchair activity did not occur: N/A         Wheelchair 50 feet with 2 turns activity    Assist    Wheelchair 50 feet with 2 turns activity did not occur: N/A       Wheelchair 150 feet activity     Assist  Wheelchair 150 feet activity did not occur: N/A       Blood pressure 116/73, pulse 72, temperature 98.1 F (36.7 C), temperature source Oral, resp. rate 18, height 5'  10" (1.778 m), weight 72.3 kg, SpO2 100 %.    Medical Problem List and Plan: 1.  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  -spoke with wife yesterday regarding medical status, medications, rehab plan, etc 2.  Antithrombotics: Subcutaneous heparin initiated 03/10/2021.  No plan for long-term anticoagulation -DVT/anticoagulation: Superficial vein thrombosis right upper extremity involving right cephalic vein age indeterminant DVT right axillary vein.  Lower extremity Dopplers negative -wife asked about safety of heparin with ICH as she was under the impression he would be on no blood thinners. I explained to her that he is receiving prophylactic dose heparin only             -antiplatelet therapy: N/A 3. Pain Management: Tylenol as needed 4. Insomnia: grounds pass for day/night rhythm  -changed melatonin to prn as wife feels it's making him groggy in the morning  7/3 did seem more alert today. He might benefit from stimulant 5. Neuropsych: This patient is not capable of making decisions on his own behalf. 6. Skin/Wound Care: Routine skin checks 7. Fluids/Electrolytes/Nutrition: Routine in and outs  8.  Acute hypoxic respiratory failure.  Extubated 03/13/2021 9.  MSSA trach aspirate and group C strep.  7-day course antibiotics completed. 10.  Incidental findings COVID-19.  Airborne precautions completed 03/16/2021 11.  History of tobacco use.  Counseling 12. Right sided neglect: counseled on sitting on right side of patient to encourage him  to look in this direction.  13. Agitation: labs stable 6/28. D/c IV and minimize lab draws. Wife may stay over at night if she would like to help calm patient  14. Elevated CBGs: HgbA1c. Likely reactive. D/c Ensure and replace with Juven.  15. Constipation: no bm since 6/26----sorbitol 7/2 with good results  Disposition: estimated 21-24 days. HFU scheduled 05/02/21 9:20am    LOS: 4 days A FACE TO FACE EVALUATION WAS PERFORMED  Ranelle Oyster 03/23/2021, 10:18 AM

## 2021-03-24 ENCOUNTER — Encounter (HOSPITAL_COMMUNITY): Payer: Self-pay | Admitting: Physical Medicine and Rehabilitation

## 2021-03-24 MED ORDER — POTASSIUM CHLORIDE 20 MEQ PO PACK
20.0000 meq | PACK | Freq: Once | ORAL | Status: AC
  Administered 2021-03-24: 20 meq via ORAL
  Filled 2021-03-24: qty 1

## 2021-03-24 NOTE — Progress Notes (Signed)
Patient ID: Casey Branch, male   DOB: 17-Jul-1981, 40 y.o.   MRN: [id number] Follow up with the patient and wife regarding medications, care and educational needs. Wife reports she feels comfortable with medications and prompted toileting; ocassional incontinence with timed toileting. Patient is doing well managing meals; was a little overwhelmed initially however with encouragement to eat what he could and take his time, patient is now doing well with meals. Continue to follow along to discharge to address questions and educational needs and facilitate preparation for discharge. Pamelia Hoit, RN

## 2021-03-24 NOTE — Progress Notes (Signed)
PROGRESS NOTE   Subjective/Complaints: Ambulating better with Christian in hallway Has no complaints Joking! Unable to determine the holiday today despite Christian's prompting  ROS: Denies pain   Objective:   No results found. No results for input(s): WBC, HGB, HCT, PLT in the last 72 hours.  No results for input(s): NA, K, CL, CO2, GLUCOSE, BUN, CREATININE, CALCIUM in the last 72 hours.   Intake/Output Summary (Last 24 hours) at 03/24/2021 1006 Last data filed at 03/24/2021 0835 Gross per 24 hour  Intake 800 ml  Output --  Net 800 ml        Physical Exam: Vital Signs Blood pressure 114/73, pulse 73, temperature 97.9 F (36.6 C), temperature source Oral, resp. rate 18, height 5\' 10"  (1.778 m), weight 72.3 kg, SpO2 100 %. Gen: no distress, normal appearing HEENT: oral mucosa pink and moist, INO bilateral R>L Cardio: Reg rate Chest: normal effort, normal rate of breathing Abd: soft, non-distended Ext: no edema Psych: flat, difficult to engage Neurologic: Cranial nerves II through XII intact, motor strength is 5/5 in left and 4/5 RIgh tdeltoid, bicep, tricep, grip, ,  INO bilateral R>L Fairly alert  Musculoskeletal: Full range of motion in all 4 extremities. No joint swelling, no pain with active assisted range of motion.   Assessment/Plan: 1. Functional deficits which require 3+ hours per day of interdisciplinary therapy in a comprehensive inpatient rehab setting. Physiatrist is providing close team supervision and 24 hour management of active medical problems listed below. Physiatrist and rehab team continue to assess barriers to discharge/monitor patient progress toward functional and medical goals  Care Tool:  Bathing    Body parts bathed by patient: Left arm, Right arm, Chest, Abdomen, Face     Body parts n/a: Front perineal area, Buttocks, Right upper leg, Left upper leg, Right lower leg, Left lower  leg   Bathing assist Assist Level: Contact Guard/Touching assist     Upper Body Dressing/Undressing Upper body dressing   What is the patient wearing?: Pull over shirt    Upper body assist Assist Level: Supervision/Verbal cueing    Lower Body Dressing/Undressing Lower body dressing      What is the patient wearing?: Pants     Lower body assist Assist for lower body dressing: Total Assistance - Patient < 25%     Toileting Toileting    Toileting assist Assist for toileting: Total Assistance - Patient < 25%     Transfers Chair/bed transfer  Transfers assist     Chair/bed transfer assist level: Moderate Assistance - Patient 50 - 74%     Locomotion Ambulation   Ambulation assist      Assist level: Moderate Assistance - Patient 50 - 74% Assistive device: Hand held assist (HHA) Max distance: 177ft   Walk 10 feet activity   Assist     Assist level: Moderate Assistance - Patient - 50 - 74% Assistive device: No Device, Hand held assist   Walk 50 feet activity   Assist    Assist level: Moderate Assistance - Patient - 50 - 74% Assistive device: Hand held assist    Walk 150 feet activity   Assist Walk 150 feet activity did not  occur: Safety/medical concerns         Walk 10 feet on uneven surface  activity   Assist Walk 10 feet on uneven surfaces activity did not occur: Safety/medical concerns         Wheelchair     Assist Will patient use wheelchair at discharge?: No   Wheelchair activity did not occur: N/A         Wheelchair 50 feet with 2 turns activity    Assist    Wheelchair 50 feet with 2 turns activity did not occur: N/A       Wheelchair 150 feet activity     Assist  Wheelchair 150 feet activity did not occur: N/A       Blood pressure 114/73, pulse 73, temperature 97.9 F (36.6 C), temperature source Oral, resp. rate 18, height 5\' 10"  (1.778 m), weight 72.3 kg, SpO2 100 %.    Medical Problem List and  Plan: 1.  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  -spoke with wife yesterday regarding medical status, medications, rehab plan, etc 2.  Antithrombotics: Subcutaneous heparin initiated 03/10/2021.  No plan for long-term anticoagulation -DVT/anticoagulation: Superficial vein thrombosis right upper extremity involving right cephalic vein age indeterminant DVT right axillary vein.  Lower extremity Dopplers negative -wife asked about safety of heparin with ICH as she was under the impression he would be on no blood thinners. I explained to her that he is receiving prophylactic dose heparin only             -antiplatelet therapy: N/A 3. Pain Management: N/A 4. Insomnia: grounds pass for day/night rhythm  -changed melatonin to prn as wife feels it's making him groggy in the morning. Sleep has improved at night.  5. Neuropsych: This patient is not capable of making decisions on his own behalf. 6. Skin/Wound Care: Routine skin checks 7. Fluids/Electrolytes/Nutrition: Routine in and outs  8.  Acute hypoxic respiratory failure.  Extubated 03/13/2021 9.  MSSA trach aspirate and group C strep.  7-day course antibiotics completed. 10.  Incidental findings COVID-19.  Airborne precautions completed 03/16/2021 11.  History of tobacco use.  Counseling 12. Right sided neglect: counseled on sitting on right side of patient to encourage him to look in this direction.  13. Agitation: labs stable 6/28. D/c IV and minimize lab draws. Wife may stay  over at night if she would like to help calm patient  14. Elevated CBGs: HgbA1c. Likely reactive. D/c Ensure and replace with Juven.  15. Constipation: no bm since 6/26----sorbitol 7/2 with good results 16. Impaired attention: improving with modafinil- continue  17. Hypokalemia: klor 9/2 once today, repeat K+ next week.   Disposition: estimated 21-24 days. HFU scheduled 05/02/21 9:20am    LOS: 5 days A FACE TO FACE EVALUATION WAS PERFORMED  Jacee Enerson P Cherrell Maybee 03/24/2021, 10:06 AM

## 2021-03-24 NOTE — Progress Notes (Signed)
Occupational Therapy Session Note  Patient Details  Name: Casey Branch MRN: [id number] Date of Birth: December 10, 1980  Today's Date: 03/24/2021 OT Individual Time: 1100-1200 OT Individual Time Calculation (min): 60 min    Short Term Goals: Week 1:  OT Short Term Goal 1 (Week 1): Pt will donn pants with min assist OT Short Term Goal 2 (Week 1): Pt will complete toilet transfer with min assist OT Short Term Goal 3 (Week 1): Pt will bathe UB with supervision OT Short Term Goal 4 (Week 1): Pt will bathe LB with mod assist.  Skilled Therapeutic Interventions/Progress Updates:    Pt sitting in TIS w/c with wife present throughout session.  Pt reports no c/o pain and agreeable to bathing and dressing during OT session.  Pt required increased time and repetitive question cues for decision making with two choices provided.  Pt reporting "It doesn't matter to me" when asked if he would rather bathe at the sink or the shower.  Pt ultimately agreeable to bathing at shower level with verbal cues provided for reasoning of this is how pt normally bathes at home.  Pt reporting he needs to urinate when prompted by therapist.  Pt completed stand pivot to standing in front of toilet for continent episode of urine with CGA for balance and clothing management.  Stand pivot back to w/c then to shower bench in walk-in shower with min assist.  Pt completed UB and LB bathing and dressing with CGA at sit<>stand level and needing only min Vcs for sequencing and intiating tasks.  Pt completed lateral stepping out of walk in shower and stand pivot to sit in w/c with min assist.  Pt completed oral and face hygiene at sink with some encouragement to initiate.  Pt needed increased time to scan to right to retrieve items from sink but able to without cueing.  Pt brushed teeth and washed face with supervision seated in w/c.  Pt requesting to get back in bed at end of session and completed SPT w/c to EOB then sit to supine with CGA.  Call  bell in reach, bed alarm on, telesitter on.    Therapy Documentation Precautions:  Precautions Precautions: Fall Precaution Comments: R inattention, L gaze preference, Impulsive Restrictions Weight Bearing Restrictions: No    Therapy/Group: Individual Therapy  Amie Critchley 03/24/2021, 12:55 PM

## 2021-03-24 NOTE — Progress Notes (Addendum)
Speech Language Pathology Daily Session Note  Patient Details  Name: Casey Branch MRN: [id number] Date of Birth: [birth year]  Today's Date: 03/24/2021 SLP Individual Time: 7035-0093 SLP Individual Time Calculation (min): 42 min  Short Term Goals: Week 1: SLP Short Term Goal 1 (Week 1): Patient will maintain attention to basic level functional tasks for increments of 2-3 minutes with maxA cues. SLP Short Term Goal 2 (Week 1): Patient will perform familiar functional ADL's (brush teeth, hair, etc) with modA cues for accuracy. SLP Short Term Goal 3 (Week 1): Patient will point to identify objects/object pictures or photos in field of two with modA cues for at least 60% accuracy. SLP Short Term Goal 4 (Week 1): Patient will name at least 10 different common objects with mod-maxA. SLP Short Term Goal 5 (Week 1): Patient will match object to picture/photo in field of 2-3 with modA cues.  Skilled Therapeutic Interventions: Skilled ST services focused on language. Pt demonstrated much improved expressive and receptive language skills compared to previous session with deficits of reduced focused/sustained attention and error awareness impacting performance. Pt was able to name 10/10 objects with 30% sentence completion cues and on 2nd trial of same objects 10/10 objects with 50% sentence completion/phonetic cues impacted by reduced attention, however noted increase of verbal error awareness on 2nd trial. Pt was able to identify objects in a field of 2 in 9 out 10 opportunities and in a field of 3 in 5 out 5 opportunities. Pt was able to identify objects in photographs from a field of 3 in 4 out 5 opportunities. Pt demonstrated increased difficulty matching word to photographs of objects , with ability to read the word and match when options were presented 1 by 1.SLP recommends matching word to objects and continued expressive naming tasks in upcoming sessions. Pt required encouragement and cues for sustained  attention in 2-3 minute intervals. Pt was left in room with call bell within reach and bed alarm set. Recommend to continue skilled ST services.      Pain Pain Assessment Pain Scale: 0-10 Pain Score: 0-No pain  Therapy/Group: Individual Therapy  Casey Branch  [facility] 03/24/2021, 2:34 PM

## 2021-03-24 NOTE — Progress Notes (Signed)
Physical Therapy Session Note  Patient Details  Name: Casey Branch MRN: [id number] Date of Birth: 1981/03/04  Today's Date: 03/24/2021 PT Individual Time: 0900-0957 PT Individual Time Calculation (min): 57 min   Short Term Goals: Week 1:  PT Short Term Goal 1 (Week 1): Pt will comlete bed mobility with minA PT Short Term Goal 2 (Week 1): Pt will complete bed<>chair transfers with minA and LRAD PT Short Term Goal 3 (Week 1): Pt will ambulate 166ft with minA and LRAD PT Short Term Goal 4 (Week 1): Pt will show improved signs of safety awareness and decreased impulsivity during therapies  Skilled Therapeutic Interventions/Progress Updates:     Pt greeted reclined in TIS w/c - wife at bedside and pt agreeable to PT session. Wife reports improved alertness/awakeness compared to prior days. Pt denies any pain. Remains with flat affect and somewhat somnolent with limited verbalizations throughout session. W/c transport to main rehab gym. Gait training 120ft + 248ft with min/modA and no AD - provided HHA for 1st trial and no HHA for 2nd trial. Gait somewhat ataxic with narrow BOS, R lateral lean, drifting towards R, and R knee flexed in stance. Pt oriented to self only - when reoriented to month and year, he was unable to recall ~40 minutes later in the session. When seated on mat table, impulsive lying down as pt reports feeling "tired." MD present for morning rounds - notified of concern for picking at cranial incision - also made charge RN aware at end of session.   Focused remainder of session on NMR with BITS system while seated in w/c (attempted to perform in standing but pt unable to dual-cog task): -bell cancellation - pt unable to identify bells (0% accuracy) with decreased error recognition when he chose something else (would frequently choose the saw or gun images).   -tracing letters & numbers - delayed processing with ~25% accuracy while tracing large printed letters (B, C) and numbers (8,  3). Attempted to assess visual deficits while completing this but due to cognitive deficits and decreased ability to express himself, this was difficult to discern.  -user paced target reaching - continues to demonstrate delayed processing but improved initiation compared to prior activities. Under/overshooting with RUE. Distracted from hospital sounds (call lights, nurses, traffic, etc).   *Initiated this in standing but pt requesting to sit so that he could focus.   Returned to his room at end of session and completed bed mobility with minA. Pt ended session sidelying in bed with all needs in reach, wife at bedside (updated on pt's mobility and tolerance to tx), bed alarm on and telesitter in room.    Therapy Documentation Precautions:  Precautions Precautions: Fall Precaution Comments: R inattention, L gaze preference, Impulsive Restrictions Weight Bearing Restrictions: No General:     Therapy/Group: Individual Therapy  Orrin Brigham 03/24/2021, 7:46 AM

## 2021-03-25 MED ORDER — BACITRACIN-NEOMYCIN-POLYMYXIN OINTMENT TUBE
TOPICAL_OINTMENT | Freq: Two times a day (BID) | CUTANEOUS | Status: DC
  Filled 2021-03-25: qty 14

## 2021-03-25 NOTE — Progress Notes (Signed)
Patient ID: Casey Branch, male   DOB: 01-13-1981, 40 y.o.   MRN: [id number]  Spoke with wife via telephone to inform of team conference goals of CGA level and target discharge date of 7/26. She is usually here but had to do some work from home and needed to be there. She can see his progress and frustration with not being able to do what is asked of him and the time it takes for him to process the task. She or his siblings will be here often to provide emotional support. Continue to work on discharge needs.

## 2021-03-25 NOTE — Progress Notes (Signed)
Physical Therapy Session Note  Patient Details  Name: Casey Branch MRN: [id number] Date of Birth: 10-12-1980  Today's Date: 03/25/2021 PT Individual Time: 5027-7412 + 8786-7672 PT Individual Time Calculation (min): 57 min + 48 min  Short Term Goals: Week 1:  PT Short Term Goal 1 (Week 1): Pt will comlete bed mobility with minA PT Short Term Goal 2 (Week 1): Pt will complete bed<>chair transfers with minA and LRAD PT Short Term Goal 3 (Week 1): Pt will ambulate 169ft with minA and LRAD PT Short Term Goal 4 (Week 1): Pt will show improved signs of safety awareness and decreased impulsivity during therapies  Skilled Therapeutic Interventions/Progress Updates:     1st session: Pt greeted supine in bed to start session - sister Banker) at bedside. Pt awake and agreeable to PT tx without reports of pain. When questioned, reports he would like to use the bathroom. Bed mobility completed with supervision. Sit<>stand with minA and no AD with initial sway present requiring minA for standing balance. Gait ~31ft to bathroom with minA and no AD and pt standing to urinate, continent of bladder. Ambulated to sink to perform hand hygiene which she completed with minA and no AD - he needed cues for visual scanning to locate soap and paper towels. Ambulatory transfer with minA and no AD to TIS w/c and then transported to main rehab gym for time management. Focused majority of session on sustained and focused attention, as well as problem solving, by completing moderate complexity puzzles. He completed these in standing to challenge dual-cog tasks and needed minA for standing balance due to R lateral lean. He required mod to max verbal cues to complete 1st puzzle and he needed total verbal cues to complete 2nd puzzle (other therapists/patients arriving in rehab gym, limiting focused attention and promoting distractibility). Ambulated with minA and no AD from main rehab gym to ortho rehab gym, ~133ft. With seated rest  break on mat table, pt completing impulsive lying down which has been common during sessions. Pt asked earlier in session month and year, unable to provide either. Provided reorientation however ~30 minutes and ~5 minutes later when asked, pt unable to recall despite prompting. Ambulated back to his room, ~243ft, with minA and no AD - gait deficits include mild ataxia with R drifting and R lean, narrow BOS - when asked, pt aware of deficits but describes his gait deficits as "bad directions." He completed stand to sit with minA to EOB and bed mobility completed with supervision. He remained sidelying in bed with all needs in reach and bed alarm on, sister remained at bedside.   2nd session: Pt sleeping on arrival to start session and requires extra time to wake up with mod verbal and tactile awakening. Pt requesting to stay in his room for therapy session. Retrieved puzzles/equipment from rehab gym and brought back to his room. Pt needing prompting to perform supine<>sitting EOB, which he was able to do with supervision. While seated EOB, worked on a variety of puzzles ranging from mild to moderate complexity. Pt with decreased sustained and focused attention this afternoon as compared to morning session. He was unable to understand simple instructions for puzzle making, despite demonstration and max cues. Attempted to engage him in word search puzzles that were hanging over his bed but pt unable to identify a word despite max cues on location and spelling. Used 3-D puzzle with washer/wire to work on spatial and 3-D problem solving while using RUE to incorporate R attention and  problem solving which he was able to do with max cues and only needed min cues while using his LUE. He noted to become easily frustrated while completing these tasks and benefited from explanation and ransoming behind them to work on [employer] for brain healing and neural plasticity. Pt returning to bed and requesting to return to sleep. He  missed 12 minutes of skilled therapy due to fatigue. Telesitter on and bed alarm activated. Pt made comfortable. All needs within reach.  Therapy Documentation Precautions:  Precautions Precautions: Fall Precaution Comments: R inattention, L gaze preference, Impulsive Restrictions Weight Bearing Restrictions: No General: PT Amount of Missed Time (min): 12 Minutes PT Missed Treatment Reason: Patient fatigue  Therapy/Group: Individual Therapy  Orrin Brigham 03/25/2021, 7:41 AM

## 2021-03-25 NOTE — Progress Notes (Signed)
PROGRESS NOTE   Subjective/Complaints: Mr. Weida has no complaints He has been picking his scalp wound- nursing order placed that suture may be removed, bacitracin applied, and dressing applied  ROS: Denies pain, insomnia improved   Objective:   No results found. No results for input(s): WBC, HGB, HCT, PLT in the last 72 hours.  No results for input(s): NA, K, CL, CO2, GLUCOSE, BUN, CREATININE, CALCIUM in the last 72 hours.   Intake/Output Summary (Last 24 hours) at 03/25/2021 1232 Last data filed at 03/25/2021 0900 Gross per 24 hour  Intake 460 ml  Output --  Net 460 ml        Physical Exam: Vital Signs Blood pressure 118/78, pulse 85, temperature 98 F (36.7 C), temperature source Oral, resp. rate 18, height 5\' 10"  (1.778 m), weight 72.3 kg, SpO2 98 %. Gen: no distress, normal appearing HEENT: oral mucosa pink and moist, INO bilateral R>L Cardio: Reg rate Chest: normal effort, normal rate of breathing Abd: soft, non-distended Ext: no edema Psych: flat, difficult to engage Neurologic: Cranial nerves II through XII intact, motor strength is 5/5 in left and 4/5 RIgh tdeltoid, bicep, tricep, grip, ,  INO bilateral R>L Fairly alert  Musculoskeletal: Full range of motion in all 4 extremities. No joint swelling, no pain with active assisted range of motion. Skin: 1 suture on scalp, bruising, small opening.    Assessment/Plan: 1. Functional deficits which require 3+ hours per day of interdisciplinary therapy in a comprehensive inpatient rehab setting. Physiatrist is providing close team supervision and 24 hour management of active medical problems listed below. Physiatrist and rehab team continue to assess barriers to discharge/monitor patient progress toward functional and medical goals  Care Tool:  Bathing    Body parts bathed by patient: Left arm, Right arm, Chest, Abdomen, Face, Front perineal area, Buttocks,  Right upper leg, Left upper leg, Right lower leg, Left lower leg     Body parts n/a: Front perineal area, Buttocks, Right upper leg, Left upper leg, Right lower leg, Left lower leg   Bathing assist Assist Level: Contact Guard/Touching assist     Upper Body Dressing/Undressing Upper body dressing   What is the patient wearing?: Pull over shirt    Upper body assist Assist Level: Supervision/Verbal cueing    Lower Body Dressing/Undressing Lower body dressing      What is the patient wearing?: Pants, Underwear/pull up     Lower body assist Assist for lower body dressing: Contact Guard/Touching assist     Toileting Toileting    Toileting assist Assist for toileting: Contact Guard/Touching assist     Transfers Chair/bed transfer  Transfers assist     Chair/bed transfer assist level: Minimal Assistance - Patient > 75%     Locomotion Ambulation   Ambulation assist      Assist level: Minimal Assistance - Patient > 75% Assistive device: No Device Max distance: 271ft   Walk 10 feet activity   Assist     Assist level: Minimal Assistance - Patient > 75% Assistive device: No Device   Walk 50 feet activity   Assist    Assist level: Minimal Assistance - Patient > 75% Assistive device: No  Device    Walk 150 feet activity   Assist Walk 150 feet activity did not occur: Safety/medical concerns  Assist level: Minimal Assistance - Patient > 75% Assistive device: No Device    Walk 10 feet on uneven surface  activity   Assist Walk 10 feet on uneven surfaces activity did not occur: Safety/medical concerns         Wheelchair     Assist Will patient use wheelchair at discharge?: No   Wheelchair activity did not occur: N/A         Wheelchair 50 feet with 2 turns activity    Assist    Wheelchair 50 feet with 2 turns activity did not occur: N/A       Wheelchair 150 feet activity     Assist  Wheelchair 150 feet activity did not  occur: N/A       Blood pressure 118/78, pulse 85, temperature 98 F (36.7 C), temperature source Oral, resp. rate 18, height 5\' 10"  (1.778 m), weight 72.3 kg, SpO2 98 %.    Medical Problem List and Plan: 1.  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  -spoke with wife yesterday regarding medical status, medications, rehab plan, etc  -Interdisciplinary Team Conference today   2.  Antithrombotics: Subcutaneous heparin initiated 03/10/2021.  No plan for long-term anticoagulation -DVT/anticoagulation: Superficial vein thrombosis right upper extremity involving right cephalic vein age indeterminant DVT right axillary vein.  Lower extremity Dopplers negative -wife asked about safety of heparin with ICH as she was under the impression he would be on no blood thinners. I explained to her that he is receiving prophylactic dose heparin only             -antiplatelet therapy: N/A 3. Pain Management: N/A 4. Insomnia: grounds pass for day/night rhythm  -changed melatonin to prn as wife feels it's making him groggy in the morning. Sleep has improved at night.  5. Neuropsych: This patient is not capable of making decisions on his own behalf. 6. Scalp incision: may remove the remaining suture, apply bacitracin, and dressing.  7. Fluids/Electrolytes/Nutrition: Routine in and outs  8.  Acute hypoxic respiratory failure.  Extubated 03/13/2021 9.  MSSA trach aspirate and group C strep.  7-day course antibiotics completed. 10.  Incidental  findings COVID-19.  Airborne precautions completed 03/16/2021 11.  History of tobacco use.  Counseling 12. Right sided neglect: counseled on sitting on right side of patient to encourage him to look in this direction.  13. Agitation: labs stable 6/28. D/c IV and minimize lab draws. Wife may stay over at night if she would like to help calm patient. Improved 14. Elevated CBGs: HgbA1c. Likely reactive. D/c Ensure and replace with Juven.  15. Constipation: no bm since 6/26----sorbitol 7/2 with good results 16. Impaired attention: improving with modafinil- continue 17. Hypokalemia: klor 9/2 once today, repeat K+ next week.    Disposition: estimated 21-24 days. HFU scheduled 05/02/21  9:20am    LOS: 6 days A FACE TO FACE EVALUATION WAS PERFORMED  Drema Pry Neelah Mannings 03/25/2021, 12:32 PM

## 2021-03-25 NOTE — Progress Notes (Signed)
Occupational Therapy Session Note  Patient Details  Name: Casey Branch MRN: [id number] Date of Birth: 05-27-1981  Today's Date: 03/25/2021 OT Individual Time: 1030-1130 OT Individual Time Calculation (min): 60 min    Short Term Goals: Week 1:  OT Short Term Goal 1 (Week 1): Pt will donn pants with min assist OT Short Term Goal 2 (Week 1): Pt will complete toilet transfer with min assist OT Short Term Goal 3 (Week 1): Pt will bathe UB with supervision OT Short Term Goal 4 (Week 1): Pt will bathe LB with mod assist.  Skilled Therapeutic Interventions/Progress Updates:    Pt received resting in bed with his sister present.  Pt shook head no to taking a shower today and changing clothes. His clothing was in good shape for a second wearing.  Pt sat to EOB with cues.  Asked pt about going outside and he declined.  His sister was worried about his eyes adjusting to the sunlight. Suggested they bring a baseball cap and sunglasses.  Stood with CGA but had a R sway - cues to straighten R leg. Pt tends to move quickly, causing him to feel "lightheaded" then sits very quickly.  Sat at sink to wash face and brush teeth with extra time needed.  Transferred to gym via wc.  Pt engaged in R [facility] of fastening nuts and bolts but he kept using L hand. When cued to use his R hand pt got very frustrated and said " I am done!"  Removed activity and waited 1 minute then asked him if he would be willing to work on other exercises and he was agreeable. Stand pivot to mat and sat down to work on large BUE AROM movements with therapy ball with assist for full ROM of RUE.  Cued pt to look at R arm. Not clear if pt is truly weak or if he has such R inattention that he does not engage the muscles.    Holding back of chair pt worked on squats with mod cues and guiding A.   Returned to room and pt needed to toilet. Walked in and out of BR with min A but 2 LOB with min A to recover as pt moving quickly and not fully  attending to R side. He appears to have a R visual field cut.  Pt wanted to lay down. Bed alarm set and all needs met.   Therapy Documentation Precautions:  Precautions Precautions: Fall Precaution Comments: R inattention, L gaze preference, Impulsive Restrictions Weight Bearing Restrictions: No    Pain: Pain Assessment Pain Score: 0-No pain ADL: ADL Lower Body Dressing: Maximal assistance Where Assessed-Lower Body Dressing: Bed level Toileting: Dependent Where Assessed-Toileting: Bed level  Therapy/Group: Individual Therapy  [place] 03/25/2021, 12:38 PM

## 2021-03-25 NOTE — Patient Care Conference (Signed)
Inpatient RehabilitationTeam Conference and Plan of Care Update Date: 03/25/2021   Time: 13:16 PM    Patient Name: Casey Branch      Medical Record Number: [id number]  Date of Birth: 1981-08-03 Sex: Male         Room/Bed: 4W15C/4W15C-01 Payor Info: Payor: MEDICAID POTENTIAL / Plan: MEDICAID POTENTIAL / Product Type: *No Product type* /    Admit Date/Time:  03/19/2021  4:04 PM  Primary Diagnosis:  Intraventricular hemorrhage [facility])  Hospital Problems: Principal Problem:   Intraventricular hemorrhage (HCC) Active Problems:   ICH (intracerebral hemorrhage) [facility])    Expected Discharge Date: Expected Discharge Date: 04/15/21  Team Members Present: Physician leading conference: Dr. Sula Soda Care Coodinator Present: Dossie Der, LCSW;Jaycob Mcclenton Lambert Mody, RN, BSN, CRRN Nurse Present: Chana Bode, RN PT Present: Wynelle Link, PT OT Present: Dolphus Jenny, OT SLP Present: Feliberto Gottron, SLP PPS Coordinator present : Fae Pippin, SLP     Current Status/Progress Goal Weekly Team Focus  Bowel/Bladder   incont of B/B. last BM-7/3  regain cont of B/B.  assess PRN, and offer toileting q 2-3 hours   Swallow/Nutrition/ Hydration             ADL's   CGA to supervision for bathing, dressing, and seated sinkside grooming; mod assist for functional transfers; right visual field impairment/inattention, takes increased time for processing, impaired initiation/decision making and somewhat apathetic.  supervision to CGA; will need to upgrade goals if performance is consistent  ADL training, right visual scanning/attention, decision making/initiating/problem solving training, functional transfers   Mobility   Performance fluctuates depending on fatigue and behavior. Can be minA for bed mobility, modA for stand<>pivot transfers, min to modA for gait up to ~166ft with no AD  CGA overall  Improving activity tolerance and alertness, task attenuation, safety training, pt and family education,  functional transfers, gait training with LRAD, initiating stair training.   Communication   mod-to-max A for word finding, verbal initiation, communicating functional needs  supervision to minA receptive and expressive language  Working on error awareness, increasing attention, naming, word and photo identification   Safety/Cognition/ Behavioral Observations  mod-to-max A  min A  alertness, sustained attention   Pain   pt doesn't c/o pain  pain free  assess pain q shift and PRN   Skin   skin intact. surg. incision to head- sutures  remove sutures. remain intact  assess q shift and PRN     Discharge Planning:  HOme with wife and sister to assist-wife to take FMLA to assist with his care.   Team Discussion: Patient picking at suture on head; MD order to remove and cover. Patient is more attentive than previously noted and participating in therapy. Requires max cues and instructions for decision making if given two options, and limit distractions. Incontinent/continent, reinforce timed toileting. Function fluctuates with fatigue and effort. Questionable vision in right eye; patient responses are vague when questioned however not tracking.  Patient on target to meet rehab goals: yes, Mod assist stand pivot - CGA. Min - mod assist for mobility due to ataxia, narrow base of support.  *See Care Plan and progress notes for long and short-term goals.   Revisions to Treatment Plan:  Working on receptive language word finding, problem solving, and safety awareness  Teaching Needs: Safety, cues, medications, transfers,   Current Barriers to Discharge: Decreased caregiver support and Home enviroment access/layout  Possible Resolutions to Barriers: Family Education Wife working remotely/FMLA request submitted     Medical Summary Current  Status: sleeping better at night, R>L INO, impaired attention, scalp incision, hypokalemia, incontinent intermittently  Barriers to Discharge: Medical  stability  Barriers to Discharge Comments: R>L INO, impaired attention, scalp incision, hypokalemia, incontinent intermittently Possible Resolutions to [employer] Focus: education regarding INO/neglect, bacitracin to scalp incision, suture removal, apply dressing, supplement K+, timed voiding, continue modafinil   Continued Need for Acute Rehabilitation Level of Care: The patient requires daily medical management by a physician with specialized training in physical medicine and rehabilitation for the following reasons: Direction of a multidisciplinary physical rehabilitation program to maximize functional independence : Yes Medical management of patient stability for increased activity during participation in an intensive rehabilitation regime.: Yes Analysis of laboratory values and/or radiology reports with any subsequent need for medication adjustment and/or medical intervention. : Yes   I attest that I was present, lead the team conference, and concur with the assessment and plan of the team.   Chana Bode B 03/25/2021, 3:43 PM

## 2021-03-26 MED ORDER — MODAFINIL 100 MG PO TABS
200.0000 mg | ORAL_TABLET | Freq: Every day | ORAL | Status: DC
  Administered 2021-03-27 – 2021-04-02 (×7): 200 mg via ORAL
  Filled 2021-03-26 (×7): qty 2

## 2021-03-26 NOTE — Progress Notes (Signed)
PROGRESS NOTE   Subjective/Complaints: No complaints this morning, showering with OT [name] notes that wife asked to stay over- this would be fine. Wife asks about Adderal vs. Donepezil- will increase modafinil to 200mg  instead since appears to be helpful and I believe to be more effective than donepezil and with less side effects that Adderal.   ROS: Denies pain, insomnia improved   Objective:   No results found. No results for input(s): WBC, HGB, HCT, PLT in the last 72 hours.  No results for input(s): NA, K, CL, CO2, GLUCOSE, BUN, CREATININE, CALCIUM in the last 72 hours.   Intake/Output Summary (Last 24 hours) at 03/26/2021 1244 Last data filed at 03/26/2021 0930 Gross per 24 hour  Intake 720 ml  Output 127 ml  Net 593 ml        Physical Exam: Vital Signs Blood pressure 128/83, pulse 71, temperature 97.7 F (36.5 C), resp. rate 16, height 5\' 10"  (1.778 m), weight 72.3 kg, SpO2 100 %. Gen: no distress, normal appearing, in shower.  HEENT: oral mucosa pink and moist, INO bilateral R>L Cardio: Reg rate Chest: normal effort, normal rate of breathing Abd: soft, non-distended Ext: no edema Psych: flat, difficult to engage Neurologic: Cranial nerves II through XII intact, motor strength is 5/5 in left and 4/5 RIgh tdeltoid, bicep, tricep, grip, ,  INO bilateral R>L Fairly alert  Musculoskeletal: Full range of motion in all 4 extremities. No joint swelling, no pain with active assisted range of motion. Skin: 1 suture on scalp, bruising, small opening.    Assessment/Plan: 1. Functional deficits which require 3+ hours per day of interdisciplinary therapy in a comprehensive inpatient rehab setting. Physiatrist is providing close team supervision and 24 hour management of active medical problems listed below. Physiatrist and rehab team continue to assess barriers to discharge/monitor patient progress toward functional  and medical goals  Care Tool:  Bathing    Body parts bathed by patient: Left arm, Right arm, Chest, Abdomen, Face, Front perineal area, Buttocks, Right upper leg, Left upper leg, Right lower leg, Left lower leg     Body parts n/a: Front perineal area, Buttocks, Right upper leg, Left upper leg, Right lower leg, Left lower leg   Bathing assist Assist Level: Contact Guard/Touching assist     Upper Body Dressing/Undressing Upper body dressing   What is the patient wearing?: Pull over shirt    Upper body assist Assist Level: Supervision/Verbal cueing    Lower Body Dressing/Undressing Lower body dressing      What is the patient wearing?: Pants, Underwear/pull up     Lower body assist Assist for lower body dressing: Contact Guard/Touching assist     Toileting Toileting    Toileting assist Assist for toileting: Contact Guard/Touching assist     Transfers Chair/bed transfer  Transfers assist     Chair/bed transfer assist level: Minimal Assistance - Patient > 75%     Locomotion Ambulation   Ambulation assist      Assist level: Minimal Assistance - Patient > 75% Assistive device: No Device Max distance: 565ft   Walk 10 feet activity   Assist     Assist level: Minimal Assistance - Patient >  75% Assistive device: No Device   Walk 50 feet activity   Assist    Assist level: Minimal Assistance - Patient > 75% Assistive device: No Device    Walk 150 feet activity   Assist Walk 150 feet activity did not occur: Safety/medical concerns  Assist level: Minimal Assistance - Patient > 75% Assistive device: No Device    Walk 10 feet on uneven surface  activity   Assist Walk 10 feet on uneven surfaces activity did not occur: Safety/medical concerns         Wheelchair     Assist Will patient use wheelchair at discharge?: No   Wheelchair activity did not occur: N/A         Wheelchair 50 feet with 2 turns activity    Assist     Wheelchair 50 feet with 2 turns activity did not occur: N/A       Wheelchair 150 feet activity     Assist  Wheelchair 150 feet activity did not occur: N/A       Blood pressure 128/83, pulse 71, temperature 97.7 F (36.5 C), resp. rate 16, height 5\' 10"  (1.778 m), weight 72.3 kg, SpO2 100 %.    Medical Problem List and Plan: 1.  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  -spoke with wife yesterday regarding medical status, medications, rehab plan, etc 2.  Antithrombotics: Subcutaneous heparin initiated 03/10/2021.  No plan for long-term anticoagulation -DVT/anticoagulation: Superficial vein thrombosis right upper extremity involving right cephalic vein age indeterminant DVT right axillary vein.  Lower extremity Dopplers negative -wife asked about safety of heparin with ICH as she was under the impression he would be on no blood thinners. I explained to her that he is receiving prophylactic dose heparin only             -antiplatelet therapy: N/A 3. Pain Management: N/A 4. Insomnia: grounds pass for day/night rhythm  -changed melatonin to prn as wife feels it's making him groggy in the morning. Sleep has improved at night.  5. Neuropsych: This patient is not capable of making decisions on his own behalf. 6. Scalp incision: may remove the remaining suture, apply bacitracin, and dressing. Continue Juven 7. Fluids/Electrolytes/Nutrition: Routine in and outs  8.  Acute hypoxic respiratory failure.  Extubated  03/13/2021 9.  MSSA trach aspirate and group C strep.  7-day course antibiotics completed. 10.  Incidental findings COVID-19.  Airborne precautions completed 03/16/2021 11.  History of tobacco use.  Counseling 12. Right sided neglect: counseled on sitting on right side of patient to encourage him to look in this direction.  13. Agitation: labs stable 6/28. D/c IV and minimize lab draws. Wife may stay over at night if she would like to help calm patient. Improved 14. Elevated CBGs: HgbA1c. Likely reactive. D/c Ensure and replace with Juven.  15. Constipation: no bm since 6/26----sorbitol 7/2 with good results 16. Impaired attention: improving with modafinil- continue, increase  to 200mg .  17. Hypokalemia: klor once today, repeat K+ next week.  18. Incontinence: continue bowel program.    Disposition: estimated 21-24 days. HFU scheduled 05/02/21 9:20am    LOS: 7 days A FACE TO FACE EVALUATION WAS PERFORMED  07/02/21 P Yaritzel Stange 03/26/2021, 12:44 PM

## 2021-03-26 NOTE — Progress Notes (Addendum)
Occupational Therapy Session Note  Patient Details  Name: Casey Branch MRN: [id number] Date of Birth: 28-Nov-1980  Today's Date: 03/26/2021 OT Individual Time: 1000-1100; 5176-1607 OT Individual Time Calculation (min): 60 min ; 30 min   Short Term Goals: Week 1:  OT Short Term Goal 1 (Week 1): Pt will donn pants with min assist OT Short Term Goal 2 (Week 1): Pt will complete toilet transfer with min assist OT Short Term Goal 3 (Week 1): Pt will bathe UB with supervision OT Short Term Goal 4 (Week 1): Pt will bathe LB with mod assist.  Skilled Therapeutic Interventions/Progress Updates:    First Session: Pt supine in bed, sister in room through the majority of session.  Pt had no c/o pain throughout session.  Increased time needed to process simple questions and selecting from two choices.  Also intermittently requiring repetition of question. Sister reported she thought maybe he was stalling to participate; educated sister on pts need for increased time to process and opportunity to practice decision making to reduce apathy towards therapy sessions and improve reasoning, awareness, and attention skills.    Pt reported he would like to take a shower.  Supine to sit with close supervision.  Sit to stand with CGA and pt ambulated around bed to closet with one loss of balance to the right requiring mod assist to recover safely.  Pt retrieved clothing from closet with CGA and ambulated to bathroom.  Pt required max multimodal cues initially to safely undress in seated position for problem solving and safety awareness (pt attempting to turn water on first, then undress in standing position; when therapist cue'd him to turn water off to sit on shower bench for dressing, pt unable to relocate shower faucet control).  Pt completed all UB bathing and dressing at sit<>stand level using grab bars with CGA and min intermittent Vcs for safety.  Pt then ambulated to sink and brushed teeth in standing with close  supervision.  Pt attempting to leave sink afterwards, needing cues to scan to right to locate and retrieve deoderant.  Pt applied with close supervision in standing.    Pt ambulated back to EOB, then stand to sit with min assist.  Instructed him through clock drawing task.  Pt able to draw outer circle initially, however had significant difficulty labeling numbers in accurate position.  Attempted multiple times due to pt beginning to show frustration, however pt labeling numbers in very small print and in straight vertical line at midline.  Discontinued once pt appearing more frustrated and encouraged pt with therapeutic use of self.  Assessed pts peripheral however results very inconsistent.  Visual tracking assessed noting pt having difficulty to attend to task long enough to complete, needing repetitive verbal cues to re attend.  Also noting difficulty tracking upper right quadrant with bilateral eyes, however difficult to determine accuracy due to poor attention skills.  Pt returned to supine with posey belt alarm donned, telesitter on, call bell in reach, and bed alarm on at end of session.    Second session:  Pt sidelying in bed presenting with flat affect, however, polite to therapist and agreeable to OT session.  Pt completed supine to sit with close supervision.  Stand pivot EOB to w/c with CGA.  Transported to quiet room to reduce outside distraction during visual scanning activity.  Pt able to scan and identify 5/6 fruit objects placed on table to the left and in front of pt, however when asked to pick up fruit  item (placed on right side of pts visual field), pt unable to locate initially.  OT provided pt with instruction to turn his head all the way to the right and see if he could find it then.  Pt did so, and was able to then immediately locate and grasp item.  Educated pt on symptoms of visual field cut and compensatory technique using light house technique to scan environment.  Completed same  activity again this time with instruction to implement technique and pt able to quickly locate all items on table.  Pt returned to room and completed stand pivot w/c to EOB with min assist.  Call bell in reach, posey alarm belt on, telesitter on, bed alarm on at end of session.  Therapy Documentation Precautions:  Precautions Precautions: Fall Precaution Comments: R inattention, L gaze preference, Impulsive Restrictions Weight Bearing Restrictions: No   Therapy/Group: Individual Therapy  Amie Critchley 03/26/2021, 12:33 PM

## 2021-03-26 NOTE — Progress Notes (Signed)
Physical Therapy Weekly Progress Note  Patient Details  Name: Casey Branch MRN: [id number] Date of Birth: [birth year]  Beginning of progress report period: March 20, 2021 End of progress report period: March 26, 2021  Today's Date: 03/26/2021 PT Individual Time: 0800-0900 PT Individual Time Calculation (min): 60 min   Patient has met 2 of 4 short term goals and has partially met 2 of 4 short term goals.  Pt is making appropriate progress towards his goals. His assist level and ability depends greatly on fatigue, effort, behavior, and focused/sustained attention. He has required minA for bed mobility, primarily for initiation. He is able to sit unsupported with CGA and requires minA for stand<>pivot transfers due to R lean and decreased dynamic standing balance. He is improved his ability to ambulate as requires minA with no AD, ambulates >339f on level surfaces. Gait deficits include narrow BOS, R drifting and R lean, R knee flexed in stance. He continues to benefit from skilled therapy and will continue POC.   Patient continues to demonstrate the following deficits muscle weakness, decreased cardiorespiratoy endurance, unbalanced muscle activation, motor apraxia, decreased coordination, and decreased motor planning, decreased visual acuity, decreased visual perceptual skills, and decreased visual motor skills, decreased attention to right, decreased motor planning, and ideational apraxia, decreased initiation, decreased attention, decreased awareness, decreased problem solving, decreased safety awareness, decreased memory, and delayed processing, and decreased sitting balance, decreased standing balance, decreased postural control, hemiplegia, and decreased balance strategies and therefore will continue to benefit from skilled PT intervention to increase functional independence with mobility.  Patient progressing toward long term goals..  Continue plan of care.  PT Short Term Goals Week 1:  PT Short  Term Goal 1 (Week 1): Pt will comlete bed mobility with minA PT Short Term Goal 1 - Progress (Week 1): Met PT Short Term Goal 2 (Week 1): Pt will complete bed<>chair transfers with minA and LRAD PT Short Term Goal 2 - Progress (Week 1): Partly met PT Short Term Goal 3 (Week 1): Pt will ambulate 1062fwith minA and LRAD PT Short Term Goal 3 - Progress (Week 1): Met PT Short Term Goal 4 (Week 1): Pt will show improved signs of safety awareness and decreased impulsivity during therapies PT Short Term Goal 4 - Progress (Week 1): Partly met Week 2:  PT Short Term Goal 1 (Week 2): Pt will complete bed mobility with CGA PT Short Term Goal 2 (Week 2): Pt will consistently complete bed<>chair transfers with CGA and LRAD PT Short Term Goal 3 (Week 2): Pt will ambulated 25022fith minA and LRAD PT Short Term Goal 4 (Week 2): Pt will initiate stair training  Skilled Therapeutic Interventions/Progress Updates:    Pt received sitting EOB with sister sitting next to him. Pt eating breakfast and was using his RUE to help cut up his french toast. Also using RUE to reach for cup. Sister and wife (wife on facetime) had plenty of reasonable concerns and questions. Questions regarding "no straws" sign above bed, timed toileting, wife staying the night to improve pt comfort, and concern regarding nursing checking in on pt. Messaged MD via secure chat to obtain order to allow wife to spend the night - MD approved and family made aware. Messaged SLP via secure chat to clarify "no straws" sign and SLP approved straws - removed sign. Spoke with RN to relay concerns and assisted in facilitating discussion b/w family and RN - ChaAgricultural consultantso arriving to assist with concerns. During this time, assisted  pt out of the bed with CGA and completed stand<>pivot transfer with minA to his TIS w/c. Wheeled out of his room to ortho gym for quiet space. Completed stand<>pivot transfer with minA to mat table, without AD. Rehab tech present for  +2 assist for remaining session. In standing, completed ball toss to rehab tech with PT providing minA guard due to R lean. Worked on reaching outside BOS, reaction time, and task attenuation. Worked on ball toss with bias to R to promote R attention and visual scanning - increased difficulty compared to midline toss. Incorporated dual-cog task with naming words that begin with letter 'A' - able to provide only 1 word. He had x2 slow LOB to the R required maxA for correction with delayed righting. Next, worked on dynamic gait where he ambulated >523f with minA and no AD while tossing ball to rehab tech, requiring minA for balance due to R lean. Engaged him in conversation throughout gait to continue dual-cog component. Pt much more conversant today compared to prior sessions with improved ability to express needs/wants but still has flat affect. He ended session supine in bed with bed alarm on and his sister at bedside. All needs within reach and informed of upcoming therapy schedule. Sister appreciative of PT assistance .  Therapy Documentation Precautions:  Precautions Precautions: Fall Precaution Comments: R inattention, L gaze preference, Impulsive Restrictions Weight Bearing Restrictions: No General:    Therapy/Group: Individual Therapy  Ansleigh Safer P Mertice Uffelman 03/26/2021, 7:45 AM

## 2021-03-26 NOTE — Progress Notes (Signed)
Speech Language Pathology Daily Session Note  Patient Details  Name: Casey Branch MRN: [id number] Date of Birth: 1981/06/25  Today's Date: 03/26/2021 SLP Individual Time: 1500-1530 SLP Individual Time Calculation (min): 30 min  Short Term Goals: Week 1: SLP Short Term Goal 1 (Week 1): Patient will maintain attention to basic level functional tasks for increments of 2-3 minutes with maxA cues. SLP Short Term Goal 2 (Week 1): Patient will perform familiar functional ADL's (brush teeth, hair, etc) with modA cues for accuracy. SLP Short Term Goal 3 (Week 1): Patient will point to identify objects/object pictures or photos in field of two with modA cues for at least 60% accuracy. SLP Short Term Goal 4 (Week 1): Patient will name at least 10 different common objects with mod-maxA. SLP Short Term Goal 5 (Week 1): Patient will match object to picture/photo in field of 2-3 with modA cues.  Skilled Therapeutic Interventions:   Patient seen for skilled ST session focusing on language skills. Patient able to name verb/action photos with 70% accuracy and modA cues due to perseveration on previous item. He performed divergent naming task with maxA to name 3 different items in each category, but unable to name more than 3 despite significant cues .When describing object function, patient required modA cues to expand beyond one word, such as bicycle, "fix". Patient asked "Does this thing have to stay on??" Referring to waist belt alarm. He continues to benefit from skilled SLP intervention to maximize cognitive and linguistic function prior to discharge.  Pain Pain Assessment Pain Scale: 0-10 Pain Score: 0-No pain  Therapy/Group: Individual Therapy  Angela Nevin, MA, CCC-SLP Speech Therapy

## 2021-03-26 NOTE — Progress Notes (Signed)
Occupational Therapy Session Note  Patient Details  Name: Casey Branch MRN: [id number] Date of Birth: 1981-02-13  Today's Date: 03/26/2021 OT Individual Time: 1530-1554 OT Individual Time Calculation (min): 24 min    Short Term Goals: Week 1:  OT Short Term Goal 1 (Week 1): Pt will donn pants with min assist OT Short Term Goal 2 (Week 1): Pt will complete toilet transfer with min assist OT Short Term Goal 3 (Week 1): Pt will bathe UB with supervision OT Short Term Goal 4 (Week 1): Pt will bathe LB with mod assist.   Skilled Therapeutic Interventions/Progress Updates:    Pt greeted at time of session side lying in bed finishing up with SLP, hand off to OT, no c/o pain during session. Note that posey belt no longer needs to be on during time in bed per SLP/RN, telesitter in room. Stand pivot no AD bed > wheelchair > mat CGA/close supervision with tendency for R lean. In standing 2x10 of the following: holding therapy ball for BUE integration and improving body awareness crossing midline and overhead press, seated 2x10 hip flexion and UE stretches on therapy ball. Stand pivot > wheelchair and ambulated in room to toilet CGA. Standing to urinate and hand hygiene at sink same manner before sit > supine bed with Supervision. Hand off to RN for med pass.    Therapy Documentation Precautions:  Precautions Precautions: Fall Precaution Comments: R inattention, L gaze preference, Impulsive Restrictions Weight Bearing Restrictions: No     Therapy/Group: Individual Therapy  Erasmo Score 03/26/2021, 7:22 AM

## 2021-03-27 ENCOUNTER — Encounter (HOSPITAL_COMMUNITY): Payer: Self-pay | Admitting: Physical Medicine and Rehabilitation

## 2021-03-27 NOTE — Progress Notes (Signed)
Occupational Therapy Session Note  Patient Details  Name: Casey Branch MRN: [id number] Date of Birth: 1981/03/05  Today's Date: 03/27/2021 OT Individual Time: 5573-2202 OT Individual Time Calculation (min): 75 min    Short Term Goals: Week 1:  OT Short Term Goal 1 (Week 1): Pt will donn pants with min assist OT Short Term Goal 2 (Week 1): Pt will complete toilet transfer with min assist OT Short Term Goal 3 (Week 1): Pt will bathe UB with supervision OT Short Term Goal 4 (Week 1): Pt will bathe LB with mod assist.  Skilled Therapeutic Interventions/Progress Updates:    Pt received in bed and agreeable to therapy.  Cued pt that his goals today were to work on scanning to his R by turning his head and to pause with transition movements of sit to stand before ambulating.  Pt did extremely well with this and as a result had NO LOB today, did well ambulating in room, standing to brush teeth and urinate at toilet.  He needs safety cues to sit doff and don clothing over feet.    See ADL documentation: ADL Grooming: Supervision/safety Where Assessed-Grooming: Standing at sink Upper Body Bathing: Supervision/safety Where Assessed-Upper Body Bathing: Shower Lower Body Bathing: Supervision/safety Where Assessed-Lower Body Bathing: Shower Upper Body Dressing: Supervision/safety Where Assessed-Upper Body Dressing: Chair Lower Body Dressing: Supervision/safety Where Assessed-Lower Body Dressing: Chair, Edge of bed Toileting: Supervision/safety Where Assessed-Toileting: Glass blower/designer: Therapist, music Method: Product/process development scientist Method: Ambulating  Pt completed ADLs in a timely manner.  Pt taken outside to shady area to work on a word search but he was not able to see the smaller font due to blurriness.  It was quite hot so did not stay outside long.    Pt asked exactly which hospital he was in.  Had him read large  sign with large font which he was able to do without any difficulty.   Used the sign as a vision test and to observe pt.  He will close L eye to read as he does have intermittent diplopia.  With R eye closed he can read and has clear vision but L oculomotor muscles are weak as he could only track a target 2x before losing his visual fixation.  He continues to have a R field cut but has improved with his scanning with head turns.   L eye unable to converge.  Provided pt with non prescription glasses and obscured R eye with medical clear tape to force his L eye to work.  Recommended to pt and then repeated recommendations to his sister when we returned to the room: -wear glasses for 5 minutes at a time several times a day progressing to longer periods of time. Wear them when watching TV and to practice using L eye to track his finger (or family members finger) to follow a moving target.   Pt needed to toilet at end of session, his sister was able to ambulate him to the bathroom with CGA and provide close S as he both stood at toilet and sat.  His sister expressed desire to A him.  She was cleared on his safety plan to assist him and to always use the gait belt.  Pt resting in bed with all needs met and alarm set.    Therapy Documentation Precautions:  Precautions Precautions: Fall Precaution Comments: R inattention, L gaze preference, Impulsive Restrictions Weight Bearing Restrictions: No  Pain: Pain Assessment Pain Score: 0-No pain ADL: ADL Grooming: Supervision/safety Where Assessed-Grooming: Standing at sink Upper Body Bathing: Supervision/safety Where Assessed-Upper Body Bathing: Shower Lower Body Bathing: Supervision/safety Where Assessed-Lower Body Bathing: Shower Upper Body Dressing: Supervision/safety Where Assessed-Upper Body Dressing: Chair Lower Body Dressing: Supervision/safety Where Assessed-Lower Body Dressing: Chair, Edge of bed Toileting:  Supervision/safety Where Assessed-Toileting: Glass blower/designer: Therapist, music Method: Product/process development scientist Method: Ambulating    Therapy/Group: Individual Therapy  [place] 03/27/2021, 12:43 PM

## 2021-03-27 NOTE — Progress Notes (Signed)
Physical Therapy Session Note  Patient Details  Name: Casey Branch MRN: [id number] Date of Birth: Jan 10, 1981  Today's Date: 03/27/2021 PT Individual Time: 3710-6269 PT Individual Time Calculation (min): 42 min   Short Term Goals: Week 1:  PT Short Term Goal 1 (Week 1): Pt will comlete bed mobility with minA PT Short Term Goal 1 - Progress (Week 1): Met PT Short Term Goal 2 (Week 1): Pt will complete bed<>chair transfers with minA and LRAD PT Short Term Goal 2 - Progress (Week 1): Partly met PT Short Term Goal 3 (Week 1): Pt will ambulate 158f with minA and LRAD PT Short Term Goal 3 - Progress (Week 1): Met PT Short Term Goal 4 (Week 1): Pt will show improved signs of safety awareness and decreased impulsivity during therapies PT Short Term Goal 4 - Progress (Week 1): Partly met Week 2:  PT Short Term Goal 1 (Week 2): Pt will complete bed mobility with CGA PT Short Term Goal 2 (Week 2): Pt will consistently complete bed<>chair transfers with CGA and LRAD PT Short Term Goal 3 (Week 2): Pt will ambulated 256fwith minA and LRAD PT Short Term Goal 4 (Week 2): Pt will initiate stair training  Skilled Therapeutic Interventions/Progress Updates:    Pt received supine in bed with wife in the room and agreeable to therapy.   Pt transitioned supine to sit and demonstrated ability to donn shoes with supervision. Gait belt placed prior to OOB. Ambulated room <> 4W gym ~25045f2 with CGA with intermittent MinA for maintaining balance secondary to occasional and randomized left and right lateral lean.   Pt completed legos building activity at level 2. Lego pieces and picture placed on mat table and pt instructed to grab pieces and build the puzzle on the right ~12 feet away at a high table. Activity completed to target memory, problem solving, visual scanning to the right, attention to task, and dynamic stability to ambulation and turns to both the right and the left. CGA with intermittent MinA for  maintaining balance secondary to occasional and randomized left and right lateral lean. Pt required frequent cuing for remembering color pattern and building orientation.  Therapist broke task down into each level and had pt state which two colors we needed and which order to place them in.  Once pt able to state which color was to be placed first, then ambulated over to attempt build. Cuing repeated the same as when over by picture which improved patient ability to complete task. Throughout activity, pt required increased time for memory and given encouragement and verbal cuing for attention to right environment.  Pt demonstrated ability to complete stepping strategy with only CGA (no cuing) from therapist to recover balance while in standing and increase BOS. Pt stated he realized his feet were too close together. Post activity pt completed fine motor tasks of cleaning activity objects such as legos and ring of pictures with CGA.  Wife educated and signed off to safely complete toileting with pt upon returning to room.  Therapist demonstrated bed <> bathroom and hand hygiene at the sink ADL with education including: environment setup, turning bed alarm on/off, utilizing gait belt and proper hand placement, awareness to right environment, guarding to the right due to occasional right lateral lean, maintaining control with instances of impulsivity/safety. [name] demonstrated ability to safely complete activity twice with no cuing the second time.   Pt returned to supine in bed with bed alarm on and call bell within  reach. No further needs expressed from wife or patient at this time.    Therapy Documentation Precautions:  Precautions Precautions: Fall Precaution Comments: R inattention, L gaze preference, Impulsive Restrictions Weight Bearing Restrictions: No  Pain: Pain Assessment Pain Scale: 0-10 Pain Score: 0-No pain   Therapy/Group: Individual Therapy  Resa Rinks, SPT  03/27/2021,  7:00 PM

## 2021-03-27 NOTE — Progress Notes (Signed)
Occupational Therapy Session Note  Patient Details  Name: Casey Branch MRN: [id number] Date of Birth: [birth year]  Today's Date: 03/27/2021 OT Individual Time: 1400-1430 OT Individual Time Calculation (min): 30 min    Short Term Goals: Week 1:  OT Short Term Goal 1 (Week 1): Pt will donn pants with min assist OT Short Term Goal 2 (Week 1): Pt will complete toilet transfer with min assist OT Short Term Goal 3 (Week 1): Pt will bathe UB with supervision OT Short Term Goal 4 (Week 1): Pt will bathe LB with mod assist.  Skilled Therapeutic Interventions/Progress Updates:    Pt supine in bed, no c/o pain, reporting "Im not having a bad day, Im just not having a good day".  Pt had flat affect throughout session and minimal conversation/verbal responses today.  Pt agreeable to go outside to participated in right visual scanning and attention activity and to promote alertness and improved mood.  Pt completed supine to sit with close supervision.  Sit to stand and ambulation to bathroom unannounced to stand and urinate at toilet with CGA.  Pt ambulated to sink and washed hands with CGA.  Ambulated to w/c and completed stand to sit with CGA.  Throughout all functional mobility pt needing frequent Vcs to slow pace and scan to right to avoid obstacles and increase steadiness.  Pt did have one episode of LOB on the way to the bathroom but able to self correct with CGA only.  Pt transported to [facility] entrance and instructed pt in scanning activity in order to identify various environmental objects in left,frontal, and right field of vision.  Pt consistently identifying objects correctly to left and in front fields but unable to identify objects to right, needing max multimodal cues to locate including manual passive rotation of neck to the right (pt did not turn head with Vcs only). Pt also noted closing left eye when trying to focus intermittently.  Pt encouraged to wear occular occlusion glasses provided at earlier  treatment session, however pt would put them on briefly and then take them off without providing reason. Pt returned to CIR and direct hand off to SPT.    Therapy Documentation Precautions:  Precautions Precautions: Fall Precaution Comments: R inattention, L gaze preference, Impulsive Restrictions Weight Bearing Restrictions: No    Therapy/Group: Individual Therapy  Amie Critchley 03/27/2021, 6:28 PM

## 2021-03-27 NOTE — Progress Notes (Signed)
Speech Language Pathology Weekly Progress and Session Note  Patient Details  Name: Casey Branch MRN: [id number] Date of Birth: [birth year]  Beginning of progress report period: March 19, 2021 End of progress report period: March 27, 2021  Today's Date: 03/27/2021 SLP Individual Time: 1435-1500 SLP Individual Time Calculation (min): 25 min  Short Term Goals: Week 1: SLP Short Term Goal 1 (Week 1): Patient will maintain attention to basic level functional tasks for increments of 2-3 minutes with maxA cues. SLP Short Term Goal 1 - Progress (Week 1): Met SLP Short Term Goal 2 (Week 1): Patient will perform familiar functional ADL's (brush teeth, hair, etc) with modA cues for accuracy. SLP Short Term Goal 2 - Progress (Week 1): Not met SLP Short Term Goal 3 (Week 1): Patient will point to identify objects/object pictures or photos in field of two with modA cues for at least 60% accuracy. SLP Short Term Goal 3 - Progress (Week 1): Met SLP Short Term Goal 4 (Week 1): Patient will name at least 10 different common objects with mod-maxA. SLP Short Term Goal 4 - Progress (Week 1): Met SLP Short Term Goal 5 (Week 1): Patient will match object to picture/photo in field of 2-3 with modA cues. SLP Short Term Goal 5 - Progress (Week 1): Met    New Short Term Goals: Week 2: SLP Short Term Goal 1 (Week 2): Patient will maintain attention to basic level functional tasks for increments of 5-6 minutes with maxA verbal cues for redirection. SLP Short Term Goal 2 (Week 2): Patient will demonstrate functional problem solving with familiar functional ADL's (brush teeth, hair, etc) with modA verbal cues for accuracy. SLP Short Term Goal 3 (Week 2): Patient will verbalize the action/function of 10 functional objects with Mod A verbal cues. SLP Short Term Goal 4 (Week 2): Patient will point to identify objects or photos in field of two with modA verbal and visual cues for at least 75% accuracy.   Weekly Progress  Updates: Patient has made slow but functional gains and has met 4 of 5 STGs this reporting period. Currently, patient requires overall Max A multimodal cues for sustained attention to verbal and functional tasks for 2-3 minute increments. Patient is easily distracted and attention can be impacted by restlessness at times. Max A multimodal cues are also needed to perform basic and familiar tasks safely due to impaired attention, right inattention and impulsivity with poor safety awareness. Patient overall demonstrates improved verbal expression with improved ability to name and identify functional objects as well as perform verbal description tasks at the phrase level with overall Mod-Max A multimodal cues. Patient and family education ongoing. Patient would benefit from continued skilled SLP intervention to maximize his cognitive-linguistic functioning and overall functional independence prior to discharge.      Intensity: Minumum of 1-2 x/day, 30 to 90 minutes Frequency: 3 to 5 out of 7 days Duration/Length of Stay: 04/15/21 Treatment/Interventions: Cognitive remediation/compensation;Internal/external aids;Speech/Language facilitation;Cueing hierarchy;Functional tasks;Patient/family education;Environmental controls;Therapeutic Activities   Daily Session  Skilled Therapeutic Interventions:   Skilled treatment session focused on communication goals. Patient named functional items with 100% accuracy with only subtle cueing need in 20% of opportunities. Patient also independently self-monitored and corrected perseveration and overall naming errors in 75% of opportunities. SLP utilized semantic feature analysis to maximize word-finding and overall description of functional items. Patient verbalize mostly at the phrase level but required overall Mod A multimodal cues to complete task. Patient transferred back to bed at end of session with  Min verbal cues needed for safety. Patient left upright in bed with  alarm on, posey belt in place and all needs within reach. Continue with current plan of care.      Pain Pain Assessment Pain Score: 0-No pain  Therapy/Group: Individual Therapy  Gracelynn Bircher 03/27/2021, 3:51 PM

## 2021-03-27 NOTE — Progress Notes (Signed)
PROGRESS NOTE   Subjective/Complaints: No complaints this morning Asks when his discharge is, discussed with him and he is agreeable  ROS: Denies pain, insomnia improved   Objective:   No results found. No results for input(s): WBC, HGB, HCT, PLT in the last 72 hours.  No results for input(s): NA, K, CL, CO2, GLUCOSE, BUN, CREATININE, CALCIUM in the last 72 hours.   Intake/Output Summary (Last 24 hours) at 03/27/2021 1109 Last data filed at 03/27/2021 0755 Gross per 24 hour  Intake 960 ml  Output --  Net 960 ml        Physical Exam: Vital Signs Blood pressure 101/81, pulse 60, temperature 98.5 F (36.9 C), temperature source Oral, resp. rate 16, height 5\' 10"  (1.778 m), weight 70.8 kg, SpO2 100 %. Gen: no distress, normal appearing, in shower.  HEENT: oral mucosa pink and moist, INO bilateral R>L Cardio: Reg rate Chest: normal effort, normal rate of breathing Abd: soft, non-distended Ext: no edema Psych: flat, difficult to engage Neurologic: Impaired attention. Cranial nerves II through XII intact, motor strength is 5/5 in left and 4/5 RIgh tdeltoid, bicep, tricep, grip, ,  INO bilateral R>L Fairly alert  Musculoskeletal: Full range of motion in all 4 extremities. No joint swelling, no pain with active assisted range of motion. Skin: 1 suture on scalp, bruising, small opening.    Assessment/Plan: 1. Functional deficits which require 3+ hours per day of interdisciplinary therapy in a comprehensive inpatient rehab setting. Physiatrist is providing close team supervision and 24 hour management of active medical problems listed below. Physiatrist and rehab team continue to assess barriers to discharge/monitor patient progress toward functional and medical goals  Care Tool:  Bathing    Body parts bathed by patient: Left arm, Right arm, Chest, Abdomen, Face, Front perineal area, Buttocks, Right upper leg, Left upper  leg, Right lower leg, Left lower leg     Body parts n/a: Front perineal area, Buttocks, Right upper leg, Left upper leg, Right lower leg, Left lower leg   Bathing assist Assist Level: Contact Guard/Touching assist     Upper Body Dressing/Undressing Upper body dressing   What is the patient wearing?: Pull over shirt    Upper body assist Assist Level: Supervision/Verbal cueing    Lower Body Dressing/Undressing Lower body dressing      What is the patient wearing?: Pants, Underwear/pull up     Lower body assist Assist for lower body dressing: Contact Guard/Touching assist     Toileting Toileting    Toileting assist Assist for toileting: Contact Guard/Touching assist     Transfers Chair/bed transfer  Transfers assist     Chair/bed transfer assist level: Minimal Assistance - Patient > 75%     Locomotion Ambulation   Ambulation assist      Assist level: Minimal Assistance - Patient > 75% Assistive device: No Device Max distance: 574ft   Walk 10 feet activity   Assist     Assist level: Minimal Assistance - Patient > 75% Assistive device: No Device   Walk 50 feet activity   Assist    Assist level: Minimal Assistance - Patient > 75% Assistive device: No Device  Walk 150 feet activity   Assist Walk 150 feet activity did not occur: Safety/medical concerns  Assist level: Minimal Assistance - Patient > 75% Assistive device: No Device    Walk 10 feet on uneven surface  activity   Assist Walk 10 feet on uneven surfaces activity did not occur: Safety/medical concerns         Wheelchair     Assist Will patient use wheelchair at discharge?: No   Wheelchair activity did not occur: N/A         Wheelchair 50 feet with 2 turns activity    Assist    Wheelchair 50 feet with 2 turns activity did not occur: N/A       Wheelchair 150 feet activity     Assist  Wheelchair 150 feet activity did not occur: N/A       Blood  pressure 101/81, pulse 60, temperature 98.5 F (36.9 C), temperature source Oral, resp. rate 16, height 5\' 10"  (1.778 m), weight 70.8 kg, SpO2 100 %.    Medical Problem List and Plan: 1.  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  -spoke with wife yesterday regarding medical status, medications, rehab plan, etc  -therapy notes reviewed, ambulating >500 feet, still with impulsivity, cognitive deficits, perseveration, frustration at times 2.  Superficial vein thrombosis right upper extremity involving right cephalic vein age indeterminant DVT right axillary vein.  Lower extremity Dopplers negative: Continue Subcutaneous heparin initiated 03/10/2021.  No plan for long-term anticoagulation -wife asked about safety of heparin with ICH as she was under the impression he would be on no blood thinners. I explained to her that he is receiving prophylactic dose heparin only             -antiplatelet therapy: N/A 3. Pain Management: N/A 4. Insomnia: grounds pass for day/night rhythm  -changed melatonin to prn as wife feels it's making him groggy in the morning. Sleep has improved at night.  5. Neuropsych: This patient is not capable of making decisions on his own behalf. 6. Scalp incision: may remove the remaining suture, apply bacitracin, and dressing. Continue Juven 7. Fluids/Electrolytes/Nutrition: Routine in and outs  8.  Acute hypoxic respiratory failure.  Extubated 03/13/2021 9.  MSSA trach aspirate and group C strep.  7-day course  antibiotics completed. 10.  Incidental findings COVID-19.  Airborne precautions completed 03/16/2021 11.  History of tobacco use.  Counseling 12. Right sided neglect: counseled on sitting on right side of patient to encourage him to look in this direction.  13. Agitation: labs stable 6/28. D/c IV and minimize lab draws. Wife may stay over at night if she would like to help calm patient. Improved 14. Elevated CBGs: HgbA1c. Likely reactive. D/c Ensure and replace with Juven.  15. Constipation: no bm since 6/26----sorbitol 7/2 with good results 16. Impaired attention: improving with modafinil- continue, increase to 200mg .  17. Hypokalemia: klor 9/2 once today, repeat K+ next week.  18. Incontinence: continue bowel and bladder program   Disposition: estimated 21-24 days. HFU scheduled 05/02/21 9:20am    LOS: 8 days A FACE TO FACE EVALUATION WAS PERFORMED  Casey Branch P Datron Brakebill 03/27/2021, 11:09 AM

## 2021-03-28 NOTE — Progress Notes (Signed)
Occupational Therapy Session Note  Patient Details  Name: Casey Branch MRN: [id number] Date of Birth: Jul 30, 1981  Today's Date: 03/28/2021 OT Individual Time: 0092-3300 OT Individual Time Calculation (min): 45 min  and Today's Date: 03/28/2021 OT Missed Time: 15 Minutes Missed Time Reason: Patient fatigue;Patient unwilling/refused to participate without medical reason   Short Term Goals: Week 1:  OT Short Term Goal 1 (Week 1): Pt will donn pants with min assist OT Short Term Goal 2 (Week 1): Pt will complete toilet transfer with min assist OT Short Term Goal 3 (Week 1): Pt will bathe UB with supervision OT Short Term Goal 4 (Week 1): Pt will bathe LB with mod assist.  Skilled Therapeutic Interventions/Progress Updates:    Pt received sleeping in bed and pt mumbled no to getting up. Pt rolled on his side and said he was too tired.  Told pt I would come back to see if he would participate then. At that point, pt agreed.  Sat to EOB and attempted multiple times to have pt engage in [place] string exercises but he could not follow directions to keep string at nose level, he kept dropping down to his chin so he could not focus on the beads.  At this level pt did not see 2 strings as he truly was not visually focused on it.  Had pt walk to sink with min guard and pt brushed teeth and washed face with 1 cue when he said, "now what am I supposed to do?" Pt declined a shower today.  Ambulated to gym with occasional min A and min cues for R visual scanning.  In gym he worked on [employer] coordination. Sitting on mat pt used gym ball for bounce/ catch to himself and then to therapist, dribbling with R hand, overhead reaches with cues to fully extend R arm, wood chops to each side for core strength and visual tracking. Pt participated well.   Ambulated back to room and sat in bed to rest. Alarm set and all needs met.   Therapy Documentation Precautions:  Precautions Precautions: Fall Precaution Comments:  R inattention, L gaze preference, Impulsive Restrictions Weight Bearing Restrictions: No   Pain: Pain Assessment Pain Scale: 0-10 Pain Score: 0-No pain ADL: ADL Grooming: Supervision/safety Where Assessed-Grooming: Standing at sink Upper Body Bathing: Supervision/safety Where Assessed-Upper Body Bathing: Shower Lower Body Bathing: Supervision/safety Where Assessed-Lower Body Bathing: Shower Upper Body Dressing: Supervision/safety Where Assessed-Upper Body Dressing: Chair Lower Body Dressing: Supervision/safety Where Assessed-Lower Body Dressing: Chair, Edge of bed Toileting: Supervision/safety Where Assessed-Toileting: Glass blower/designer: Therapist, music Method: Product/process development scientist Method: Ambulating      Therapy/Group: Individual Therapy  [place] 03/28/2021, 12:44 PM

## 2021-03-28 NOTE — Progress Notes (Signed)
Physical Therapy Session Note  Patient Details  Name: Lexington Krotz MRN: [id number] Date of Birth: [birth year]  Today's Date: 03/28/2021 PT Individual Time: 1346-1445 PT Individual Time Calculation (min): 59 min   Short Term Goals: Week 1:  PT Short Term Goal 1 (Week 1): Pt will comlete bed mobility with minA PT Short Term Goal 1 - Progress (Week 1): Met PT Short Term Goal 2 (Week 1): Pt will complete bed<>chair transfers with minA and LRAD PT Short Term Goal 2 - Progress (Week 1): Partly met PT Short Term Goal 3 (Week 1): Pt will ambulate 161f with minA and LRAD PT Short Term Goal 3 - Progress (Week 1): Met PT Short Term Goal 4 (Week 1): Pt will show improved signs of safety awareness and decreased impulsivity during therapies PT Short Term Goal 4 - Progress (Week 1): Partly met  Skilled Therapeutic Interventions/Progress Updates: Pt presents supine in bed and agreeable to therapy.  Pt transfers sup to sit w/ supervision and scoots to EOB.  Pt dons shoes at EOB w/ set-up.  Pt transfers sit to stand from multiple surfaces during session w/ CGA although does requires cues for safety as moves very quickly.  Pt amb multiple trials at least 250' w/ CGA w/o AD.  Pt does sometimes meander side to side but to R > left.  Pt requires A and cues to avoid obstacles on right.  Pt ambulates after being given directions (ie. Go into the 2nd door on right and then turn right w/ 50% retention).  Pt performed obstacle course stepping over sticks and around cones.  Pt performed moving cones to 1 of 2 tables (put the orange cones on the low table and the other colors on high table w/ reminders of placement each trial).  Pt amb back to room and returned to supine w/ supervision.  Verbal reminder to doff shoes.  Bed alarm on and all needs in reach.     Therapy Documentation Precautions:  Precautions Precautions: Fall Precaution Comments: R inattention, L gaze preference, Impulsive Restrictions Weight Bearing  Restrictions: No General:   Vital Signs: Therapy Vitals Temp: 98.1 F (36.7 C) Pulse Rate: (!) 107 Resp: 20 BP: 130/84 Patient Position (if appropriate): Sitting Oxygen Therapy SpO2: 100 % O2 Device: Room Air Pain: no c/o pain.       Therapy/Group: Individual Therapy  JLadoris Gene7/04/2021, 2:44 PM

## 2021-03-28 NOTE — Plan of Care (Signed)
LTGs have been upgraded due to pt's progress.   Problem: RH Balance Goal: LTG: Patient will maintain dynamic sitting balance (OT) Description: LTG:  Patient will maintain dynamic sitting balance with assistance during activities of daily living (OT) Flowsheets (Taken 03/28/2021 1324) LTG: Pt will maintain dynamic sitting balance during ADLs with: Supervision/Verbal cueing   Problem: Sit to Stand Goal: LTG:  Patient will perform sit to stand in prep for activites of daily living with assistance level (OT) Description: LTG:  Patient will perform sit to stand in prep for activites of daily living with assistance level (OT) Flowsheets (Taken 03/28/2021 1324) LTG: PT will perform sit to stand in prep for activites of daily living with assistance level: Supervision/Verbal cueing   Problem: RH Eating Goal: LTG Patient will perform eating w/assist, cues/equip (OT) Description: LTG: Patient will perform eating with assist, with/without cues using equipment (OT) Flowsheets (Taken 03/28/2021 1324) LTG: Pt will perform eating with assistance level of: Independent   Problem: RH Bathing Goal: LTG Patient will bathe all body parts with assist levels (OT) Description: LTG: Patient will bathe all body parts with assist levels (OT) Flowsheets (Taken 03/28/2021 1324) LTG: Pt will perform bathing with assistance level/cueing: Supervision/Verbal cueing   Problem: RH Dressing Goal: LTG Patient will perform upper body dressing (OT) Description: LTG Patient will perform upper body dressing with assist, with/without cues (OT). Flowsheets (Taken 03/28/2021 1324) LTG: Pt will perform upper body dressing with assistance level of: Set up assist Goal: LTG Patient will perform lower body dressing w/assist (OT) Description: LTG: Patient will perform lower body dressing with assist, with/without cues in positioning using equipment (OT) Flowsheets (Taken 03/28/2021 1324) LTG: Pt will perform lower body dressing with assistance  level of: Supervision/Verbal cueing   Problem: RH Toileting Goal: LTG Patient will perform toileting task (3/3 steps) with assistance level (OT) Description: LTG: Patient will perform toileting task (3/3 steps) with assistance level (OT)  Flowsheets (Taken 03/28/2021 1324) LTG: Pt will perform toileting task (3/3 steps) with assistance level: Supervision/Verbal cueing

## 2021-03-28 NOTE — Progress Notes (Signed)
Occupational Therapy Weekly Progress Note  Patient Details  Name: Casey Branch MRN: [id number] Date of Birth: [birth year]  Beginning of progress report period: March 20, 2021 End of progress report period: March 28, 2021   Patient has met 4 of 4 short term goals.  Pt has made strong progress with balance, R side awareness, attention to task, processing from admission. He continues to struggle with these areas but the improvement in them has been such that he is now S to CGA with ADLS and min A ambulation versus mod - max on admission.   Patient continues to demonstrate the following deficits: decreased coordination and decreased motor planning, decreased visual acuity, decreased visual perceptual skills, and decreased visual motor skills, decreased attention to right, decreased attention, decreased awareness, decreased problem solving, decreased safety awareness, decreased memory, and delayed processing, and decreased sitting balance, decreased standing balance, decreased postural control, and decreased balance strategies and therefore will continue to benefit from skilled OT intervention to enhance overall performance with BADL and iADL.  Patient progressing toward long term goals..  Plan of care revisions: LTGs upgraded to Supervision with self care.  OT Short Term Goals Week 1:  OT Short Term Goal 1 (Week 1): Pt will donn pants with min assist OT Short Term Goal 1 - Progress (Week 1): Met OT Short Term Goal 2 (Week 1): Pt will complete toilet transfer with min assist OT Short Term Goal 2 - Progress (Week 1): Met OT Short Term Goal 3 (Week 1): Pt will bathe UB with supervision OT Short Term Goal 3 - Progress (Week 1): Met OT Short Term Goal 4 (Week 1): Pt will bathe LB with mod assist. OT Short Term Goal 4 - Progress (Week 1): Met Week 2:  OT Short Term Goal 1 (Week 2): STGs = LTGs that have been upgraded to S self care, CGA transfers   Therapy Documentation Precautions:   Precautions Precautions: Fall Precaution Comments: R inattention, L gaze preference, Impulsive Restrictions Weight Bearing Restrictions: No   Pain: Pain Assessment Pain Scale: 0-10 Pain Score: 0-No pain ADL: ADL Grooming: Supervision/safety Where Assessed-Grooming: Standing at sink Upper Body Bathing: Supervision/safety Where Assessed-Upper Body Bathing: Shower Lower Body Bathing: Supervision/safety Where Assessed-Lower Body Bathing: Shower Upper Body Dressing: Supervision/safety Where Assessed-Upper Body Dressing: Chair Lower Body Dressing: Supervision/safety Where Assessed-Lower Body Dressing: Chair, Edge of bed Toileting: Supervision/safety Where Assessed-Toileting: Glass blower/designer: Therapist, music Method: Magazine features editor: Curator Method: Ambulating       [place] 03/28/2021, 12:42 PM

## 2021-03-28 NOTE — Progress Notes (Signed)
PROGRESS NOTE   Subjective/Complaints: No complaints  ROS: Denies pain, insomnia improved   Objective:   No results found. No results for input(s): WBC, HGB, HCT, PLT in the last 72 hours.  No results for input(s): NA, K, CL, CO2, GLUCOSE, BUN, CREATININE, CALCIUM in the last 72 hours.   Intake/Output Summary (Last 24 hours) at 03/28/2021 2008 Last data filed at 03/28/2021 1951 Gross per 24 hour  Intake 600 ml  Output 4 ml  Net 596 ml        Physical Exam: Vital Signs Blood pressure 130/84, pulse (!) 107, temperature 98.1 F (36.7 C), resp. rate 20, height 5\' 10"  (1.778 m), weight 70.8 kg, SpO2 100 %. Gen: no distress, normal appearing, in shower.  HEENT: oral mucosa pink and moist, INO bilateral R>L Cardio: Tachycardia Chest: normal effort, normal rate of breathing Abd: soft, non-distended Ext: no edema Psych: flat, difficult to engage Neurologic: Impaired attention. Cranial nerves II through XII intact, motor strength is 5/5 in left and 4/5 RIgh tdeltoid, bicep, tricep, grip, ,  INO bilateral R>L Fairly alert  Musculoskeletal: Full range of motion in all 4 extremities. No joint swelling, no pain with active assisted range of motion. Skin: 1 suture on scalp, bruising, small opening.    Assessment/Plan: 1. Functional deficits which require 3+ hours per day of interdisciplinary therapy in a comprehensive inpatient rehab setting. Physiatrist is providing close team supervision and 24 hour management of active medical problems listed below. Physiatrist and rehab team continue to assess barriers to discharge/monitor patient progress toward functional and medical goals  Care Tool:  Bathing    Body parts bathed by patient: Left arm, Right arm, Chest, Abdomen, Face, Front perineal area, Buttocks, Right upper leg, Left upper leg, Right lower leg, Left lower leg     Body parts n/a: Front perineal area, Buttocks, Right  upper leg, Left upper leg, Right lower leg, Left lower leg   Bathing assist Assist Level: Supervision/Verbal cueing     Upper Body Dressing/Undressing Upper body dressing   What is the patient wearing?: Pull over shirt    Upper body assist Assist Level: Set up assist    Lower Body Dressing/Undressing Lower body dressing      What is the patient wearing?: Pants, Underwear/pull up     Lower body assist Assist for lower body dressing: Supervision/Verbal cueing     Toileting Toileting    Toileting assist Assist for toileting: Supervision/Verbal cueing     Transfers Chair/bed transfer  Transfers assist     Chair/bed transfer assist level: Contact Guard/Touching assist     Locomotion Ambulation   Ambulation assist      Assist level: Contact Guard/Touching assist Assistive device: No Device Max distance: 250   Walk 10 feet activity   Assist     Assist level: Contact Guard/Touching assist Assistive device: No Device   Walk 50 feet activity   Assist    Assist level: Contact Guard/Touching assist Assistive device: No Device    Walk 150 feet activity   Assist Walk 150 feet activity did not occur: Safety/medical concerns  Assist level: Contact Guard/Touching assist Assistive device: No Device    Walk  10 feet on uneven surface  activity   Assist Walk 10 feet on uneven surfaces activity did not occur: Safety/medical concerns         Wheelchair     Assist Will patient use wheelchair at discharge?: No   Wheelchair activity did not occur: N/A         Wheelchair 50 feet with 2 turns activity    Assist    Wheelchair 50 feet with 2 turns activity did not occur: N/A       Wheelchair 150 feet activity     Assist  Wheelchair 150 feet activity did not occur: N/A       Blood pressure 130/84, pulse (!) 107, temperature 98.1 F (36.7 C), resp. rate 20, height 5\' 10"  (1.778 m), weight 70.8 kg, SpO2 100 %.    Medical  Problem List and Plan: 1.  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  -spoke with wife regarding medical status, medications, rehab plan, etc  -therapy notes reviewed, ambulating >500 feet, still with impulsivity, cognitive deficits, perseveration, frustration at times 2.  Superficial vein thrombosis right upper extremity involving right cephalic vein age indeterminant DVT right axillary vein.  Lower extremity Dopplers negative: Continue Subcutaneous heparin initiated 03/10/2021.  No plan for long-term anticoagulation             -antiplatelet therapy: N/A 3. Pain Management: N/A 4. Insomnia: grounds pass for day/night rhythm  -changed melatonin to prn as wife feels it's making him groggy in the morning. Sleep has improved at night.  5. Neuropsych: This patient is not capable of making decisions on his own behalf. 6. Scalp incision: may remove the remaining suture, apply bacitracin, and dressing. Continue Juven 7. Fluids/Electrolytes/Nutrition: Routine in and outs  8.  Acute hypoxic respiratory failure.  Extubated 03/13/2021 9.  MSSA trach aspirate and group C strep.  7-day course antibiotics completed. 10.  Incidental findings COVID-19.  Airborne precautions completed 03/16/2021 11.  History of tobacco use.  Counseling 12. Right sided neglect: counseled on sitting on right side of patient to encourage him to look in this direction.  13. Agitation: labs stable 6/28. D/c IV and  minimize lab draws. Wife may stay over at night if she would like to help calm patient. Improved 14. Elevated CBGs: HgbA1c. Likely reactive. D/c Ensure and replace with Juven.  15. Constipation: no bm since 6/26----sorbitol 7/2 with good results 16. Impaired attention: improving with modafinil- continue, increase to 200mg .  17. Hypokalemia: klor 9/2 once today, repeat K+ next week.  18. Incontinence: continue bowel and bladder program 19. Tachycardic: Repeat BMP/CBC tomorrow.    Disposition: estimated 21-24 days. HFU scheduled 05/02/21 9:20am    LOS: 9 days A FACE TO FACE EVALUATION WAS PERFORMED  Acea Yagi P Haizlee Henton 03/28/2021, 8:08 PM

## 2021-03-29 LAB — CBC WITH DIFFERENTIAL/PLATELET
Abs Immature Granulocytes: 0.01 10*3/uL (ref 0.00–0.07)
Basophils Absolute: 0.1 10*3/uL (ref 0.0–0.1)
Basophils Relative: 1 %
Eosinophils Absolute: 0.2 10*3/uL (ref 0.0–0.5)
Eosinophils Relative: 3 %
HCT: 39.5 % (ref 39.0–52.0)
Hemoglobin: 13.9 g/dL (ref 13.0–17.0)
Immature Granulocytes: 0 %
Lymphocytes Relative: 37 %
Lymphs Abs: 2 10*3/uL (ref 0.7–4.0)
MCH: 31.7 pg (ref 26.0–34.0)
MCHC: 35.2 g/dL (ref 30.0–36.0)
MCV: 90 fL (ref 80.0–100.0)
Monocytes Absolute: 0.6 10*3/uL (ref 0.1–1.0)
Monocytes Relative: 11 %
Neutro Abs: 2.6 10*3/uL (ref 1.7–7.7)
Neutrophils Relative %: 48 %
Platelets: 409 10*3/uL — ABNORMAL HIGH (ref 150–400)
RBC: 4.39 MIL/uL (ref 4.22–5.81)
RDW: 12.9 % (ref 11.5–15.5)
WBC: 5.4 10*3/uL (ref 4.0–10.5)
nRBC: 0 % (ref 0.0–0.2)

## 2021-03-29 LAB — BASIC METABOLIC PANEL
Anion gap: 8 (ref 5–15)
BUN: 14 mg/dL (ref 6–20)
CO2: 26 mmol/L (ref 22–32)
Calcium: 9.3 mg/dL (ref 8.9–10.3)
Chloride: 101 mmol/L (ref 98–111)
Creatinine, Ser: 0.91 mg/dL (ref 0.61–1.24)
GFR, Estimated: >60 mL/min (ref >60)
Glucose, Bld: 86 mg/dL (ref 70–99)
Potassium: 4.3 mmol/L (ref 3.5–5.1)
Sodium: 135 mmol/L (ref 135–145)

## 2021-03-29 NOTE — Progress Notes (Signed)
Speech Language Pathology Daily Session Note  Patient Details  Name: Casey Branch MRN: [id number] Date of Birth: [birth year]  Today's Date: 03/29/2021 SLP Individual Time: 0915-1000 SLP Individual Time Calculation (min): 45 min  Short Term Goals: Week 2: SLP Short Term Goal 1 (Week 2): Patient will maintain attention to basic level functional tasks for increments of 5-6 minutes with maxA verbal cues for redirection. SLP Short Term Goal 2 (Week 2): Patient will demonstrate functional problem solving with familiar functional ADL's (brush teeth, hair, etc) with modA verbal cues for accuracy. SLP Short Term Goal 3 (Week 2): Patient will verbalize the action/function of 10 functional objects with Mod A verbal cues. SLP Short Term Goal 4 (Week 2): Patient will point to identify objects or photos in field of two with modA verbal and visual cues for at least 75% accuracy. SLP Short Term Goal 5 (Week 2): Patient will match object to picture/photo in field of 2-3 with modA verbal and visual cues.  Skilled Therapeutic Interventions:   Pt was seen in room this AM. Wife joined in room for session. Pt was cooperative throughout treatment. Completed confrontation naming with pt  as a warm up. Pt was able to name 7/10 objects independently and 10/10 objects given a sentence completion cue. Pt answered basic questions about his work and provided the name of his work and address independently. Wife confirmed these were correct. Pt verbalized, "I know what it is, I just can't get that word out". Using LARK kit, assessed reading today of single words (8/10). SLP provided cueing for pt to use his finger for scanning purposes. Pt was able to read phrases/sentences and complete with minA. Pt reported difficulty with vision while reading.   Pt was left in bed with wife in room. Bed alarm activated. Continue with current POC.   Pain Pain Assessment Pain Scale: 0-10 Pain Score: 0-No pain  Therapy/Group: Individual  Therapy  Verdene Lennert MS, CCC-SLP, CBIS  03/29/2021, 1:52 PM

## 2021-03-29 NOTE — Progress Notes (Signed)
PROGRESS NOTE   Subjective/Complaints: No complaints this morning Wife is at bedside- she has no concerns. She observed his SLP session and felt he is progressing well  ROS: Denies pain, insomnia improved, +impaired vision   Objective:   No results found. Recent Labs    03/29/21 0510  WBC 5.4  HGB 13.9  HCT 39.5  PLT 409*    Recent Labs    03/29/21 0510  NA 135  K 4.3  CL 101  CO2 26  GLUCOSE 86  BUN 14  CREATININE 0.91  CALCIUM 9.3     Intake/Output Summary (Last 24 hours) at 03/29/2021 1537 Last data filed at 03/29/2021 0846 Gross per 24 hour  Intake 118 ml  Output 3 ml  Net 115 ml        Physical Exam: Vital Signs Blood pressure 111/62, pulse 82, temperature 97.6 F (36.4 C), temperature source Oral, resp. rate 16, height 5\' 10"  (1.778 m), weight 70.8 kg, SpO2 99 %. Gen: no distress, normal appearing, in shower.  HEENT: oral mucosa pink and moist, INO bilateral R>L Cardio: Tachycardia Chest: normal effort, normal rate of breathing Abd: soft, non-distended Ext: no edema Psych: flat, difficult to engage Neurologic: Impaired attention. Cranial nerves II through XII intact, motor strength is 5/5 in left and 4/5 RIgh tdeltoid, bicep, tricep, grip, ,  INO bilateral R>L, impaired memory Fairly alert  Musculoskeletal: Full range of motion in all 4 extremities. No joint swelling, no pain with active assisted range of motion. Skin: 1 suture on scalp, bruising, small opening.    Assessment/Plan: 1. Functional deficits which require 3+ hours per day of interdisciplinary therapy in a comprehensive inpatient rehab setting. Physiatrist is providing close team supervision and 24 hour management of active medical problems listed below. Physiatrist and rehab team continue to assess barriers to discharge/monitor patient progress toward functional and medical goals  Care Tool:  Bathing    Body parts bathed by  patient: Left arm, Right arm, Chest, Abdomen, Face, Front perineal area, Buttocks, Right upper leg, Left upper leg, Right lower leg, Left lower leg     Body parts n/a: Front perineal area, Buttocks, Right upper leg, Left upper leg, Right lower leg, Left lower leg   Bathing assist Assist Level: Supervision/Verbal cueing     Upper Body Dressing/Undressing Upper body dressing   What is the patient wearing?: Pull over shirt    Upper body assist Assist Level: Set up assist    Lower Body Dressing/Undressing Lower body dressing      What is the patient wearing?: Pants, Underwear/pull up     Lower body assist Assist for lower body dressing: Supervision/Verbal cueing     Toileting Toileting    Toileting assist Assist for toileting: Supervision/Verbal cueing     Transfers Chair/bed transfer  Transfers assist     Chair/bed transfer assist level: Contact Guard/Touching assist     Locomotion Ambulation   Ambulation assist      Assist level: Contact Guard/Touching assist Assistive device: No Device Max distance: 250   Walk 10 feet activity   Assist     Assist level: Contact Guard/Touching assist Assistive device: No Device   Walk  50 feet activity   Assist    Assist level: Contact Guard/Touching assist Assistive device: No Device    Walk 150 feet activity   Assist Walk 150 feet activity did not occur: Safety/medical concerns  Assist level: Contact Guard/Touching assist Assistive device: No Device    Walk 10 feet on uneven surface  activity   Assist Walk 10 feet on uneven surfaces activity did not occur: Safety/medical concerns         Wheelchair     Assist Will patient use wheelchair at discharge?: No   Wheelchair activity did not occur: N/A         Wheelchair 50 feet with 2 turns activity    Assist    Wheelchair 50 feet with 2 turns activity did not occur: N/A       Wheelchair 150 feet activity     Assist   Wheelchair 150 feet activity did not occur: N/A       Blood pressure 111/62, pulse 82, temperature 97.6 F (36.4 C), temperature source Oral, resp. rate 16, height 5\' 10"  (1.778 m), weight 70.8 kg, SpO2 99 %.    Medical Problem List and Plan: 1.  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  Superficial vein thrombosis right upper extremity involving right cephalic vein age indeterminant DVT right axillary vein.  Lower extremity Dopplers negative: Continue Subcutaneous heparin initiated 03/10/2021.  No plan for long-term anticoagulation             -antiplatelet therapy: N/A 3. Pain Management: N/A 4. Insomnia: grounds pass for day/night rhythm  -changed melatonin to prn as wife feels it's making him groggy in the morning. Sleep has improved at night.  5. Neuropsych: This patient is not capable of making decisions on his own behalf. 6. Scalp incision: may remove the remaining suture, apply bacitracin, and dressing. Conitnue Juven 7. Fluids/Electrolytes/Nutrition: Routine in and outs  8.  Acute hypoxic respiratory failure.  Extubated 03/13/2021 9.  MSSA trach aspirate and group C strep.  7-day course antibiotics completed. 10.  Incidental findings COVID-19.  Airborne precautions completed 03/16/2021 11.  History of tobacco use.  Counseling 12. Right sided neglect:  counseled on sitting on right side of patient to encourage him to look in this direction.  13. Agitation: labs stable 6/28. D/c IV and minimize lab draws. Wife may stay over at night if she would like to help calm patient. Improved 14. Elevated CBGs: HgbA1c. Likely reactive. D/c Ensure and replace with Juven.  15. Constipation: no bm since 6/26----sorbitol 7/2 with good results 16. Impaired attention: improving with modafinil- continue, increase to 200mg .  17. Hypokalemia: klor 9/2 once today, repeat K+ next week.  18. Incontinence: continue bowel and bladder program 19. Tachycardic: Repeat BMP/CBC tomorrow.  Disposition: estimated 21-24 days. D/c 7/26. HFU scheduled 05/02/21 9:20am. Advised wife to call  Dr. Barb Merino office to schedule f/u. Wife works from home, to take [employer]- advised to give forms to SW and we will help with this.    LOS: 10 days A FACE TO FACE EVALUATION WAS PERFORMED  Drema Pry [place] Ballow 03/29/2021, 3:37 PM

## 2021-03-29 NOTE — Plan of Care (Signed)
  Problem: Safety: Goal: Non-violent Restraint(s) Outcome: Progressing   Problem: Consults Goal: RH STROKE PATIENT EDUCATION Description: See Patient Education module for education specifics  Outcome: Progressing   Problem: RH BOWEL ELIMINATION Goal: RH STG MANAGE BOWEL WITH ASSISTANCE Description: STG Manage Bowel with mod I Assistance. Outcome: Progressing   Problem: RH BLADDER ELIMINATION Goal: RH STG MANAGE BLADDER WITH ASSISTANCE Description: STG Manage Bladder With min Assistance Outcome: Progressing   Problem: RH SAFETY Goal: RH STG ADHERE TO SAFETY PRECAUTIONS W/ASSISTANCE/DEVICE Description: STG Adhere to Safety Precautions With cues/reminders Assistance/Device. Outcome: Progressing   Problem: RH COGNITION-NURSING Goal: RH STG USES MEMORY AIDS/STRATEGIES W/ASSIST TO PROBLEM SOLVE Description: STG Uses Memory Aids/Strategies With Assistance to Problem Solve with cues/reminders. Outcome: Progressing   Problem: RH PAIN MANAGEMENT Goal: RH STG PAIN MANAGED AT OR BELOW PT'S PAIN GOAL Description: At or below level 4 Outcome: Progressing   

## 2021-03-30 NOTE — Plan of Care (Signed)
  Problem: Safety: Goal: Non-violent Restraint(s) Outcome: Progressing   Problem: Consults Goal: RH STROKE PATIENT EDUCATION Description: See Patient Education module for education specifics  Outcome: Progressing   Problem: RH BOWEL ELIMINATION Goal: RH STG MANAGE BOWEL WITH ASSISTANCE Description: STG Manage Bowel with mod I Assistance. Outcome: Progressing   Problem: RH BLADDER ELIMINATION Goal: RH STG MANAGE BLADDER WITH ASSISTANCE Description: STG Manage Bladder With min Assistance Outcome: Progressing   Problem: RH SAFETY Goal: RH STG ADHERE TO SAFETY PRECAUTIONS W/ASSISTANCE/DEVICE Description: STG Adhere to Safety Precautions With cues/reminders Assistance/Device. Outcome: Progressing   Problem: RH COGNITION-NURSING Goal: RH STG USES MEMORY AIDS/STRATEGIES W/ASSIST TO PROBLEM SOLVE Description: STG Uses Memory Aids/Strategies With Assistance to Problem Solve with cues/reminders. Outcome: Progressing   Problem: RH PAIN MANAGEMENT Goal: RH STG PAIN MANAGED AT OR BELOW PT'S PAIN GOAL Description: At or below level 4 Outcome: Progressing   

## 2021-03-30 NOTE — Progress Notes (Signed)
Physical Therapy Session Note  Patient Details  Name: Casey Branch MRN: [id number] Date of Birth: [birth year]  Today's Date: 03/30/2021 PT Individual Time: 0904-1000 PT Individual Time Calculation (min): 56 min   Short Term Goals: Week 2:  PT Short Term Goal 1 (Week 2): Pt will complete bed mobility with CGA PT Short Term Goal 2 (Week 2): Pt will consistently complete bed<>chair transfers with CGA and LRAD PT Short Term Goal 3 (Week 2): Pt will ambulated 217ft with minA and LRAD PT Short Term Goal 4 (Week 2): Pt will initiate stair training  Skilled Therapeutic Interventions/Progress Updates:     Pt received in L sidelying. Agrees to therapy and no complaint of pain. Supine to sit with supervision for safety. Sit to stand with CGA and minA for stand step transfer to WC. WC transport to gym for time management. Session focused on NMR for R lower extremity as well as R inattention. Initially, pt stands with CGA and performs trunk rotation s holding onto exercise ball with arms outstretched x10. Pt then perofrms toe tapping activiting with colored cones, tasked with tapping R or L foot on red or purple cone, depending on PT command. Pt able to correctly follow cueing 25-50% of time, while requiring minA/modA for standing balance due to occasional R lean/LOB. Activity simplified to performing toe taps with R foot on red cone, progressing to alternating red and purple cones with R foot. Pt able to complete activity correctly 75% time. Cones placed in R visual field to work on R inattention. Pt then performs toe taps with L foot with PT providing blocking/guarding or R leg. Challenge progressed by having patient step to either purple or red cone, depending on PT cue, and having pt repeat color out loud as he's performing toe tap. Pt initially requires mod cueing to perform correctly but improves to performing correctly >75% of time. MinA required for balance. Pt requires extended seated rest breaks between  each trial. Pt performs stand step transfer back to [facility] with minA. WC transport back to room. Left seated with alarm intact and all needs within reach.  Therapy Documentation Precautions:  Precautions Precautions: Fall Precaution Comments: R inattention, L gaze preference, Impulsive Restrictions Weight Bearing Restrictions: No   Therapy/Group: Individual Therapy  Beau Fanny, PT, DPT 03/30/2021, 4:17 PM

## 2021-03-31 LAB — CBC
HCT: 38.8 % — ABNORMAL LOW (ref 39.0–52.0)
Hemoglobin: 13.2 g/dL (ref 13.0–17.0)
MCH: 30.8 pg (ref 26.0–34.0)
MCHC: 34 g/dL (ref 30.0–36.0)
MCV: 90.4 fL (ref 80.0–100.0)
Platelets: 366 10*3/uL (ref 150–400)
RBC: 4.29 MIL/uL (ref 4.22–5.81)
RDW: 13.2 % (ref 11.5–15.5)
WBC: 5.5 10*3/uL (ref 4.0–10.5)
nRBC: 0 % (ref 0.0–0.2)

## 2021-03-31 LAB — BASIC METABOLIC PANEL
Anion gap: 6 (ref 5–15)
BUN: 14 mg/dL (ref 6–20)
CO2: 28 mmol/L (ref 22–32)
Calcium: 8.9 mg/dL (ref 8.9–10.3)
Chloride: 99 mmol/L (ref 98–111)
Creatinine, Ser: 1.02 mg/dL (ref 0.61–1.24)
GFR, Estimated: >60 mL/min (ref >60)
Glucose, Bld: 95 mg/dL (ref 70–99)
Potassium: 4 mmol/L (ref 3.5–5.1)
Sodium: 133 mmol/L — ABNORMAL LOW (ref 135–145)

## 2021-03-31 LAB — VITAMIN D 25 HYDROXY (VIT D DEFICIENCY, FRACTURES): Vit D, 25-Hydroxy: 21.68 ng/mL — ABNORMAL LOW (ref 30–100)

## 2021-03-31 MED ORDER — DONEPEZIL HCL 10 MG PO TABS
5.0000 mg | ORAL_TABLET | Freq: Every day | ORAL | Status: DC
  Administered 2021-03-31 – 2021-04-01 (×2): 5 mg via ORAL
  Filled 2021-03-31 (×2): qty 1

## 2021-03-31 NOTE — Progress Notes (Signed)
Speech Language Pathology Daily Session Note  Patient Details  Name: Arav Bannister MRN: [id number] Date of Birth: 1980-11-09  Today's Date: 03/31/2021 SLP Individual Time: 1346-1430 SLP Individual Time Calculation (min): 44 min  Short Term Goals: Week 2: SLP Short Term Goal 1 (Week 2): Patient will maintain attention to basic level functional tasks for increments of 5-6 minutes with maxA verbal cues for redirection. SLP Short Term Goal 2 (Week 2): Patient will demonstrate functional problem solving with familiar functional ADL's (brush teeth, hair, etc) with modA verbal cues for accuracy. SLP Short Term Goal 3 (Week 2): Patient will verbalize the action/function of 10 functional objects with Mod A verbal cues. SLP Short Term Goal 4 (Week 2): Patient will point to identify objects or photos in field of two with modA verbal and visual cues for at least 75% accuracy. SLP Short Term Goal 5 (Week 2): Patient will match object to picture/photo in field of 2-3 with modA verbal and visual cues.  Skilled Therapeutic Interventions:Skilled ST services focused on language skills.  Pt was able to identify function of objects in 10 out 10 opportunities with supervision A cues to demonstrate function to aid in word finding. Pt was able to name actions in black/white pictures in 10 out 10 opportunities with min A semantic cues and x1 preservation noted. Pt was able to identify black and white action pictures in a field of 3 in 3 out 3 opportunities. Pt was able to match word to black/white pictures in a field of 3 in 3 out 3 opportunities. Pt was able to read at phrase level in 9 out 10 opportunities and match phrase to black/white object from a field of 3 in 10 ou 10 opportunities with min A semantic cues/ comprehension cues. Pt was left in room with call bell within reach and bed alarm set. SLP recommends to continue skilled services.     Pain Pain Assessment Pain Scale: 0-10 Pain Score: 0-No  pain  Therapy/Group: Individual Therapy  Ardis Lawley  [facility] 03/31/2021, 7:53 AM

## 2021-03-31 NOTE — Progress Notes (Signed)
Occupational Therapy Session Note  Patient Details  Name: Casey Branch MRN: [id number] Date of Birth: 04/16/1981  Today's Date: 03/31/2021 OT Individual Time: 1020-1130 OT Individual Time Calculation (min): 70 min    Short Term Goals: Week 2:  OT Short Term Goal 1 (Week 2): STGs = LTGs that have been upgraded to S self care, CGA transfers  Skilled Therapeutic Interventions/Progress Updates:    Pt received in room in bed with his sister present.  Pt agreeable to participating but he kept saying he wants to go home.  His sister is very involved, she put his wife on facetime so they could both received education and observe therapy.  Had pt stay in room so sister could be present.   Used explanation and demonstration with multiple activities for education on: R side inattention R side weakness and incoordination Memory  processing   Pt worked on orientation and memory with writing out the date, clock draw (improved from 1st time he did the test), design copy.    R side awareness with navigating around obstacles in the room, picking up items on the floor.  Cues to scan to the R.  Pt continues to struggle with memory but responded well to questioning cues of written choices.   With help from his sister to perform visual field cut test, he does not seem to have a field cut BUT he often presents that he does as he often has to turn his head to the R to see items on his R.    Worked on balance, core strength, R shoulder/arm strength, R/L coordination exercises he could do at home: -seated and standing wood chops with Bball adding in squats -bounce and catch with ball -holding hula hoop in standing and twisting torso, then moving hoop above head, then turning like a steering wheel.    No LOB with exercises but he does need tactile guiding (vs just demonstration) on how to start exercise and then frequent cues for completion of exercises.  Discussed safety at home with not doing yard work,  cooking without [place].  The family said they really dont want him doing those things anyway due to ongoing medical treatment of his AVM.  They are very aware of need for [address] and have the ability to provide it.  They are eager to get him home as they feel the home environment would be better for him. I also agree as they can manage him well at home.   Let pt and family know that we will discuss this in his conference.    Pt resting in bed with all needs met and alarm set.   Therapy Documentation Precautions:  Precautions Precautions: Fall Precaution Comments: R inattention, L gaze preference, Impulsive Restrictions Weight Bearing Restrictions: No    Vital Signs: Therapy Vitals Temp: 97.8 F (36.6 C) Temp Source: Oral Pulse Rate: (!) 58 Resp: 18 BP: (!) 121/57 Patient Position (if appropriate): Lying Oxygen Therapy SpO2: 98 % O2 Device: Room Air Pain: Pain Assessment Pain Scale: 0-10 Pain Score: 0-No pain ADL: ADL Grooming: Supervision/safety Where Assessed-Grooming: Standing at sink Upper Body Bathing: Supervision/safety Where Assessed-Upper Body Bathing: Shower Lower Body Bathing: Supervision/safety Where Assessed-Lower Body Bathing: Shower Upper Body Dressing: Supervision/safety Where Assessed-Upper Body Dressing: Chair Lower Body Dressing: Supervision/safety Where Assessed-Lower Body Dressing: Chair, Edge of bed Toileting: Supervision/safety Where Assessed-Toileting: Glass blower/designer: Therapist, music Method: Product/process development scientist Method: Ambulating    Therapy/Group:  Individual Therapy  [place] 03/31/2021, 9:04 AM

## 2021-03-31 NOTE — Progress Notes (Signed)
Physical Therapy Session Note  Patient Details  Name: Casey Branch MRN: [id number] Date of Birth: Feb 13, 1981  Today's Date: 03/31/2021 PT Individual Time: 1430-1525 PT Individual Time Calculation (min): 55 min   Short Term Goals: Week 2:  PT Short Term Goal 1 (Week 2): Pt will complete bed mobility with CGA PT Short Term Goal 2 (Week 2): Pt will consistently complete bed<>chair transfers with CGA and LRAD PT Short Term Goal 3 (Week 2): Pt will ambulated 23ft with minA and LRAD PT Short Term Goal 4 (Week 2): Pt will initiate stair training  Skilled Therapeutic Interventions/Progress Updates:    Pt received supine in bed, agreeable to PT session. No complaints of pain. Bed mobility independent. Pt able to don shoes with setup A while seated EOB. Sit to stand with close Supervision to [facility] thorughout session. Ambulatory transfer into bathroom with no AD and CGA. Standing balance while toileting with CGA. Ambulation up to 150 ft during therapy session with no AD and CGA, cues to attend to R visual field. Ascend/descend 12 x 6" stairs with 2 handrails and CGA progressing to Supervision for balance. Reviewed safe floor transfer technique and discussed fall recovery. Pt able to perform floor to mat transfer with Supervision with cues for sequencing and safety. Ambulation through obstacle course with no AD and CGA overall for balance: uneven surface, sidesteps with cone taps, up/down 4" step, up/down wedge, stepping over targets. Pt exhibits most difficulty completing cone taps and wedge navigation. Ambulation up/down ramp, across uneven surface, and up/down curb step with no AD and CGA for balance. Attempt to have patient recall 3 red items during gait, pt unable to recall items without max cueing. Pt exhibits difficulty even finding red objects without max cueing. Patient with poor short term memory and recall noted during session. Pt returned to bed at end of session, independent for bed mobility. Pt left  supine in bed with needs in reach, bed alarm in place, telesitter present.  Therapy Documentation Precautions:  Precautions Precautions: Fall Precaution Comments: R inattention, L gaze preference, Impulsive Restrictions Weight Bearing Restrictions: No   Therapy/Group: Individual Therapy   Peter Congo, PT, DPT, CSRS  03/31/2021, 5:25 PM

## 2021-03-31 NOTE — Progress Notes (Signed)
PROGRESS NOTE   Subjective/Complaints: Pt denies any issues- asking when gets to go home.  Explained don't know his d/c date, but should be determined Tuesday.  Bowels "OK".  Has telesitter.     ROS:  Pt denies SOB, abd pain, CP, N/V/C/D, and vision changes    Objective:   No results found. Recent Labs    03/29/21 0510 03/31/21 0844  WBC 5.4 5.5  HGB 13.9 13.2  HCT 39.5 38.8*  PLT 409* 366    Recent Labs    03/29/21 0510 03/31/21 0844  NA 135 133*  K 4.3 4.0  CL 101 99  CO2 26 28  GLUCOSE 86 95  BUN 14 14  CREATININE 0.91 1.02  CALCIUM 9.3 8.9     Intake/Output Summary (Last 24 hours) at 03/31/2021 1945 Last data filed at 03/31/2021 1744 Gross per 24 hour  Intake 680 ml  Output --  Net 680 ml        Physical Exam: Vital Signs Blood pressure 140/80, pulse 75, temperature 97.9 F (36.6 C), temperature source Oral, resp. rate 18, height 5\' 10"  (1.778 m), weight 70.8 kg, SpO2 100 %.   General: awake, alert, appropriate, sitting up in bed- staring into space; NAD HENT: oropharynx moist CV: regular rate; no JVD Pulmonary: CTA B/L; no W/R/R- good air movement GI: soft, NT, ND, (+)BS Psychiatric: appropriate- but flat Neurological: alert- has telesitter  Psych: flat, difficult to engage Neurologic: Impaired attention. Cranial nerves II through XII intact, motor strength is 5/5 in left and 4/5 RIgh tdeltoid, bicep, tricep, grip, ,  INO bilateral R>L, impaired memory Fairly alert  Musculoskeletal: Full range of motion in all 4 extremities. No joint swelling, no pain with active assisted range of motion. Skin: 1 suture on scalp, bruising, small opening.    Assessment/Plan: 1. Functional deficits which require 3+ hours per day of interdisciplinary therapy in a comprehensive inpatient rehab setting. Physiatrist is providing close team supervision and 24 hour management of active medical problems  listed below. Physiatrist and rehab team continue to assess barriers to discharge/monitor patient progress toward functional and medical goals  Care Tool:  Bathing    Body parts bathed by patient: Left arm, Right arm, Chest, Abdomen, Face, Front perineal area, Buttocks, Right upper leg, Left upper leg, Right lower leg, Left lower leg     Body parts n/a: Front perineal area, Buttocks, Right upper leg, Left upper leg, Right lower leg, Left lower leg   Bathing assist Assist Level: Supervision/Verbal cueing     Upper Body Dressing/Undressing Upper body dressing   What is the patient wearing?: Pull over shirt    Upper body assist Assist Level: Set up assist    Lower Body Dressing/Undressing Lower body dressing      What is the patient wearing?: Pants, Underwear/pull up     Lower body assist Assist for lower body dressing: Supervision/Verbal cueing     Toileting Toileting    Toileting assist Assist for toileting: Supervision/Verbal cueing     Transfers Chair/bed transfer  Transfers assist     Chair/bed transfer assist level: Contact Guard/Touching assist     Locomotion Ambulation   Ambulation assist  Assist level: Contact Guard/Touching assist Assistive device: No Device Max distance: 150'   Walk 10 feet activity   Assist     Assist level: Contact Guard/Touching assist Assistive device: No Device   Walk 50 feet activity   Assist    Assist level: Contact Guard/Touching assist Assistive device: No Device    Walk 150 feet activity   Assist Walk 150 feet activity did not occur: Safety/medical concerns  Assist level: Contact Guard/Touching assist Assistive device: No Device    Walk 10 feet on uneven surface  activity   Assist Walk 10 feet on uneven surfaces activity did not occur: Safety/medical concerns         Wheelchair     Assist Will patient use wheelchair at discharge?: No   Wheelchair activity did not occur: N/A          Wheelchair 50 feet with 2 turns activity    Assist    Wheelchair 50 feet with 2 turns activity did not occur: N/A       Wheelchair 150 feet activity     Assist  Wheelchair 150 feet activity did not occur: N/A       Blood pressure 140/80, pulse 75, temperature 97.9 F (36.6 C), temperature source Oral, resp. rate 18, height 5\' 10"  (1.778 m), weight 70.8 kg, SpO2 100 %.    Medical Problem List and Plan: 1.  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  Status post EVD which was removed 03/13/2021 Has internuclear opthalmoplegia, may benefit form eye patch Also has  reticular activating system involvement trial modafinil              -patient may  shower             -ELOS/Goals: 21-24d, continue CIR, discussed d/c date with patient and he is agreeable  -spoke with wife regarding medical status, medications, rehab plan, etc  -therapy notes reviewed, ambulating >500 feet, still with impulsivity, cognitive deficits, perseveration, frustration at times  -con't PT, OT and SLP 2.  Superficial vein thrombosis right upper extremity involving right cephalic vein age indeterminant DVT right axillary vein.  Lower extremity Dopplers negative: Continue Subcutaneous heparin initiated 03/10/2021.  No plan for long-term anticoagulation             -antiplatelet therapy: N/A 3. Pain Management: N/A 4. Insomnia: grounds pass for day/night rhythm  -changed melatonin to prn as wife feels it's making him groggy in the morning. Sleep has improved at night.  5. Neuropsych: This patient is not capable of making decisions on his own behalf. 6. Scalp incision: may remove the remaining suture, apply bacitracin, and dressing. Conitnue Juven 7. Fluids/Electrolytes/Nutrition: Routine in and outs  8.  Acute hypoxic respiratory failure.  Extubated 03/13/2021 9.  MSSA trach  aspirate and group C strep.  7-day course antibiotics completed. 10.  Incidental findings COVID-19.  Airborne precautions completed 03/16/2021 11.  History of tobacco use.  Counseling 12. Right sided neglect: counseled on sitting on right side of patient to encourage him to look in this direction.  13. Agitation: labs stable 6/28. D/c IV and minimize lab draws. Wife may stay over at night if she would like to help calm patient. Improved 14. Elevated CBGs: HgbA1c. Likely reactive. D/c Ensure and replace with Juven.  15. Constipation: no bm since 6/26----sorbitol 7/2 with good results  7/11- per pt, bowels going  well- con't regimen 16. Impaired attention: improving with modafinil- continue, increase to 200mg .  17. Hypokalemia: klor once today, repeat K+ next week.   7/11- K+ 4.0- con't to monitor 18. Incontinence: continue bowel and bladder program 19. Tachycardic: Repeat BMP/CBC tomorrow.  Disposition: estimated 21-24 days. D/c 7/26. HFU scheduled 05/02/21 9:20am. Advised wife to call Dr. 07/02/21 office to schedule f/u. Wife works from home, to take Barb Merino- advised to give forms to SW and we will help with this.    LOS: 12 days A FACE TO FACE EVALUATION WAS PERFORMED  Alyssandra Hulsebus 03/31/2021, 7:45 PM

## 2021-04-01 ENCOUNTER — Other Ambulatory Visit (HOSPITAL_COMMUNITY): Payer: Self-pay

## 2021-04-01 MED ORDER — MELATONIN 3 MG PO TABS
3.0000 mg | ORAL_TABLET | Freq: Every evening | ORAL | 0 refills | Status: DC | PRN
  Filled 2021-04-01: qty 30, 30d supply, fill #0

## 2021-04-01 MED ORDER — MODAFINIL 200 MG PO TABS
200.0000 mg | ORAL_TABLET | Freq: Every day | ORAL | 0 refills | Status: DC
  Filled 2021-04-01 – 2021-04-02 (×2): qty 30, 30d supply, fill #0

## 2021-04-01 MED ORDER — DONEPEZIL HCL 5 MG PO TABS
5.0000 mg | ORAL_TABLET | Freq: Every day | ORAL | 0 refills | Status: DC
  Filled 2021-04-01: qty 30, 30d supply, fill #0

## 2021-04-01 NOTE — Progress Notes (Signed)
Occupational Therapy Discharge Summary  Patient Details  Name: Ethin Drummond MRN: [id number] Date of Birth: [birth year]   Patient has met 10 of 10 long term goals due to improved activity tolerance, improved balance, postural control, functional use of  RIGHT upper and RIGHT lower extremity, improved attention, improved awareness, and improved coordination.  Patient to discharge at overall CLOSE Supervision level.  Patient's care partner is independent to provide the necessary physical and cognitive assistance at discharge.    Reasons goals not met: n/a  Recommendation:  Patient will benefit from ongoing skilled OT services in outpatient setting to continue to advance functional skills in the area of BADL, iADL, and Vocation.  Equipment: No equipment provided - family will obtain a shower chair  Reasons for discharge: treatment goals met  Patient/family agrees with progress made and goals achieved: Yes  OT Discharge Precautions/Restrictions  Precautions Precautions: Fall Precaution Comments: R inattention,  Impulsive  ADL ADL Eating: Independent Grooming: Supervision/safety Where Assessed-Grooming: Standing at sink Upper Body Bathing: Supervision/safety Where Assessed-Upper Body Bathing: Shower Lower Body Bathing: Supervision/safety Where Assessed-Lower Body Bathing: Shower Upper Body Dressing: Supervision/safety Where Assessed-Upper Body Dressing: Chair Lower Body Dressing: Supervision/safety Where Assessed-Lower Body Dressing: Chair, Edge of bed Toileting: Supervision/safety Where Assessed-Toileting: Glass blower/designer: Close supervision Toilet Transfer Method: Magazine features editor: Close supervision Social research officer, government Method: Ambulating Vision Ocular Range of Motion: Restricted on the right;Restricted looking up;Impaired-to be further tested in functional context Alignment/Gaze Preference: Gaze left Tracking/Visual Pursuits: Right eye does not  track laterally;Left eye does not track medially;Decreased smoothness of vertical tracking;Decreased smoothness of horizontal tracking Convergence: Impaired (comment) Visual Fields: Right visual field deficit;Impaired-to be further tested in functional context Additional Comments: Pt's vision has been assessed several times during his stay but it has been very difficult to get consistent results and clear communication from pt.  his family reports that he has always closed L eye when looking at far objects since his last CVA in 2004.  Functionally during ADLS pt only presents with R inattention and it appears he has a R field cut.  His visual skills will need to be assessed further in outpt therapy as his cognition and attention improves. Perception  Perception: Impaired Inattention/Neglect: Does not attend to right visual field Praxis Praxis: Impaired Praxis Impairment Details: Motor planning Cognition Overall Cognitive Status: Impaired/Different from baseline Orientation Level: Oriented to person Attention: Focused;Sustained Focused Attention: Impaired Focused Attention Impairment: Verbal basic;Functional basic Sustained Attention: Impaired Memory: Impaired Memory Impairment: Retrieval deficit;Decreased recall of new information;Storage deficit;Decreased short term memory Awareness: Impaired Awareness Impairment: Intellectual impairment Problem Solving: Impaired Problem Solving Impairment: Verbal basic;Functional basic Decision Making: Impaired Decision Making Impairment: Verbal basic;Functional basic Initiating: Appears intact Self Correcting: Impaired Self Correcting Impairment: Verbal basic;Functional basic Safety/Judgment: Impaired Sensation Sensation Light Touch: Appears Intact Hot/Cold: Appears Intact Proprioception: Impaired by gross assessment Stereognosis: Appears Intact Coordination Gross Motor Movements are Fluid and Coordinated: No Fine Motor Movements are Fluid and  Coordinated: No Coordination and Movement Description: mild dysmetira, University functional for ADLs but he continues to have difficulty with R hand fine motor manipulation Finger Nose Finger Test: mild  dysmetria Motor  Motor Motor: Motor apraxia;Hemiplegia Mobility  Bed Mobility Bed Mobility: Rolling Right;Rolling Left;Supine to Sit;Sit to Supine Rolling Right: Independent Rolling Left: Independent Sit to Supine: Independent Transfers Sit to Stand: Independent Stand to Sit: Supervision/Verbal cueing  Trunk/Postural Assessment  Cervical Assessment Cervical Assessment: Within Functional Limits Thoracic Assessment Thoracic Assessment: Within Functional Limits Lumbar Assessment Lumbar Assessment:  Within Functional Limits Postural Control Righting Reactions: Delayed Protective Responses: Delayed  Balance Balance Balance Assessed: Yes Standardized Balance Assessment Standardized Balance Assessment: Berg Balance Test Berg Balance Test Sit to Stand: Able to stand without using hands and stabilize independently Standing Unsupported: Able to stand safely 2 minutes Sitting with Back Unsupported but Feet Supported on Floor or Stool: Able to sit safely and securely 2 minutes Stand to Sit: Sits safely with minimal use of hands Transfers: Able to transfer safely, minor use of hands Standing Unsupported with Eyes Closed: Able to stand 10 seconds safely Standing Ubsupported with Feet Together: Able to place feet together independently but unable to hold for 30 seconds From Standing, Reach Forward with Outstretched Arm: Can reach confidently >25 cm (10") From Standing Position, Pick up Object from Floor: Able to pick up shoe safely and easily From Standing Position, Turn to Look Behind Over each Shoulder: Looks behind from both sides and weight shifts well Turn 360 Degrees: Able to turn 360 degrees safely in 4 seconds or less Standing Unsupported, Alternately Place Feet on Step/Stool: Able to  complete 4 steps without aid or supervision Standing Unsupported, One Foot in Front: Needs help to step but can hold 15 seconds Standing on One Leg: Able to lift leg independently and hold 5-10 seconds Total Score: 48 Static Sitting Balance Static Sitting - Level of Assistance: 7: Independent Dynamic Sitting Balance Dynamic Sitting - Level of Assistance: 5: Stand by assistance Static Standing Balance Static Standing - Level of Assistance: 5: Stand by assistance Dynamic Standing Balance Dynamic Standing - Level of Assistance: 5: Stand by assistance Extremity/Trunk Assessment RUE Assessment Passive Range of Motion (PROM) Comments: WNL Active Range of Motion (AROM) Comments: WNL General Strength Comments: shoulder 4+/% wrist/elbow 5/5 LUE Assessment LUE Assessment: Within Functional Limits General Strength Comments: 4+/5   Zamyra Allensworth 04/01/2021, 12:44 PM

## 2021-04-01 NOTE — Patient Care Conference (Signed)
Inpatient RehabilitationTeam Conference and Plan of Care Update Date: 04/01/2021   Time: 13:31 PM    Patient Name: Casey Branch      Medical Record Number: [id number]  Date of Birth: 08-26-1981 Sex: Male         Room/Bed: 4W15C/4W15C-01 Payor Info: Payor: MEDICAID POTENTIAL / Plan: MEDICAID POTENTIAL / Product Type: *No Product type* /    Admit Date/Time:  03/19/2021  4:04 PM  Primary Diagnosis:  Intraventricular hemorrhage [facility])  Hospital Problems: Principal Problem:   Intraventricular hemorrhage (HCC) Active Problems:   ICH (intracerebral hemorrhage) [facility])    Expected Discharge Date: Expected Discharge Date: 04/02/21  Team Members Present: Physician leading conference: Dr. Sula Soda Care Coodinator Present: Dossie Der, LCSW;Jackilyn Umphlett Lambert Mody, RN, BSN, CRRN Nurse Present: Chana Bode, RN PT Present: Rada Hay, PT OT Present: Primitivo Gauze, OT SLP Present: Colin Benton, SLP     Current Status/Progress Goal Weekly Team Focus  Bowel/Bladder   Continent with some incont episodes. LBM 7/11  Be fully continent  Toilet Q3hr   Swallow/Nutrition/ Hydration             ADL's   is now at goal level and will need to remain S due to R inattention, memory  supervision to CGA  pt and fam ed, ADL training, balance   Mobility   independent bed mob, supervision transfers, supervision to cga gait, BERG 48 supervision on stairs  CGA overall  balance, functional gait, stairs   Communication   min-supervision A, word finding at phrase/sentence level  supervision A recpetive and minA expressive language  matching phrases and abstract pictures, naming and word finding skills.   Safety/Cognition/ Behavioral Observations  min-supervision A, primarly focusing on language verse cognition at this stage  min- supervision A  sustained attention, basic problem solving, error awareness and recall   Pain   No c/o pain  pain free  Assess Pain qshift and PRN   Skin   Skin intact.  Closed surgical incision to head.  Skin to remain intact  Assess Qshift and PRN     Discharge Planning:  Pt wants to go home-family education intiated and will have 24/7 care. Wants OP-pt is uninsured and will need PCP & match. Team to move DC up-per pt's request   Team Discussion: Doing well. Staff feel would do better in home environment with assistance and family can provide care.  Patient on target to meet rehab goals: yes, currently close supervision with a cane despite balance issues, right inattention and memory deficits. Min assist for expression and supervision for receptive phase for communication. Requires min assist - supervision for work finding phrase level communication.  *See Care Plan and progress notes for long and short-term goals.   Revisions to Treatment Plan:  Working on error awareness, memory, balance and right inattention  Teaching Needs: Safety, communication assistance, medications, etc.  Current Barriers to Discharge: Decreased caregiver support, Insurance for SNF coverage, and lack of insurance for follow up services  Possible Resolutions to Barriers: Family education with wife and sister Referral to [facility] PT clinic Recommend OP PT follow up on high level balance issues HEP provided to patient     Medical Summary Current Status: decreased arousal and attention, superficial DVT, scalp wound  Barriers to Discharge: Medical stability;Wound care  Barriers to Discharge Comments: decreased arousal and attention, superficial DVT, scalp wound Possible Resolutions to [employer] Focus: continue provigil 200mg , started aricept 5mg  HS yesterday, continue heparin while here, will not require any anticoagulation at  home, continue to monitor scalp   Continued Need for Acute Rehabilitation Level of Care: The patient requires daily medical management by a physician with specialized training in physical medicine and rehabilitation for the following reasons: Direction of  a multidisciplinary physical rehabilitation program to maximize functional independence : Yes Medical management of patient stability for increased activity during participation in an intensive rehabilitation regime.: Yes Analysis of laboratory values and/or radiology reports with any subsequent need for medication adjustment and/or medical intervention. : Yes   I attest that I was present, lead the team conference, and concur with the assessment and plan of the team.   Chana Bode B 04/01/2021, 2:29 PM

## 2021-04-01 NOTE — Progress Notes (Signed)
Patient ID: Casey Branch, male   DOB: [birth year], 40 y.o.   MRN: [id number] Met with wife to discuss team conference progress and plan for discharge tomorrow. She has signed him up for insurance will begin 8/1. Informed of OP referral and match for medication assistance. Obtained PCP to see for follow up. Wife feels ready for discharge tomorrow and has good family support to provide 24/7 care.

## 2021-04-01 NOTE — Progress Notes (Signed)
Physical Therapy Discharge Summary  Patient Details  Name: Casey Branch MRN: [id number] Date of Birth: 20-Jun-1981  Today's Date: 04/01/2021 PT Individual Time: 1100-1200 and 1500-1530 PT Individual Time Calculation (min): 60 min and 30 min   Patient has met 9 of 9 long term goals due to improved activity tolerance, improved balance, improved postural control, increased strength, ability to compensate for deficits, improved attention, improved awareness, and improved coordination.  Patient to discharge at an ambulatory level Supervision.   Patient's care partner is independent to provide the necessary physical and cognitive assistance at discharge.  Reasons goals not met: NA  Recommendation:  Patient will benefit from ongoing skilled PT services in outpatient setting to continue to advance safe functional mobility, address ongoing impairments in balance, safety awareness w/functional mobility, inattention w/mobility, and minimize fall risk.  Due to lack of insurance, he has been provided w/HEP as well as contact information for [facility]  Equipment: No equipment provided  Reasons for discharge: treatment goals met  Patient/family agrees with progress made and goals achieved: Yes  THERAPEUTIC INTERVENTION: AM SESSION Denies pain Pt initially supine, awake, alert and agreeable to session w/focus on balance,  DC assessment, functional mobility.  Gait >180f x 2 w/close supervision, cues to attend to R environment, mild unsteadiness w/turning. Pt participated in BOcheyedantest, see below for results.  HEP created by therapist based on deficits from this assessment.  Pt educated re; results of test, need for supervision w/mobility, purpose of HEP.  Pt also shown MMoabsite and discussed accessing HEP.  Will continue w/this next session. Pt ambulted to BR and stood at commode to urinate w/distant supervision.  Ambulated to sink w/cues for safe pace, moves quickly  and impulsively.  Sister quick to cue pt on need to slow down w/all mobility. At end of session, pt left supine w/alarm set and sister at side.  Explained HEP to sister and that formal training would occur in pm.  Sister stated that wife would be present during this sessio for training.    PM SESSION: PAIN - PT DENIES PAIN  Pt initially supine.  Supine to sit independently.   Sit to stand w/supervision.  Gait x [id number]/supervision, mild unsteadiness but no balance loss, cues to attend to R during gait.  Reviewed and performed all therex below per HEP.  Navigated to website using computer and demonstrated use of access code.  Then used video instruction to perform HEP, some cues required for proper performance of exercises and to transition from one exercise to next.  Also recommend performing w/family member at side for safety.  No family present during session.  Pt provided w/printed copy of HEP as well. Pt left supine w/rails up x 3, alarm set, bed in lowest position, and needs in reach. Pt also provided for [facility] student PT clinic due to lack of insurance.  Access Code: [email or web address]: [email or web address] Date: 04/01/2021 Prepared by: BaCallie FieldingExercises Standing Tandem Balance with Counter Support - 1 x daily - 7 x weekly - 2 sets - 5 reps - 30 hold Single Leg Stance with Support - 1 x daily - 7 x weekly - 3 sets - 10 reps - 10 hold Romberg Stance - 1 x daily - 7 x weekly - 2 sets - 5 reps - 30 hold Standing March with Counter Support - 1 x daily - 7 x weekly - 3 sets - 10 reps Heel rises with counter support - 1 x  daily - 7 x weekly - 3 sets - 10 reps  PT Discharge Precautions/Restrictions Precautions Precautions: Fall Precaution Comments: R inattention,  Impulsive Restrictions Weight Bearing Restrictions: No Vital Signs   Pain Pain Assessment Pain Scale: 0-10 Pain Score: 0-No pain Faces Pain Scale: No hurt Vision/Perception     Cognition Overall  Cognitive Status: Impaired/Different from baseline Orientation Level: Other (comment) Attention: Focused;Sustained Focused Attention: Impaired Focused Attention Impairment: Verbal basic;Functional basic Sustained Attention: Impaired Sensation Sensation Light Touch: Appears Intact Proprioception: Impaired by gross assessment Coordination Gross Motor Movements are Fluid and Coordinated: No Fine Motor Movements are Fluid and Coordinated: No Coordination and Movement Description: mild dysmetira Finger Nose Finger Test: mild  dysmetria Motor  Motor Motor: Motor apraxia;Hemiplegia  Mobility Bed Mobility Bed Mobility: Rolling Right;Rolling Left;Supine to Sit;Sit to Supine Rolling Right: Independent Rolling Left: Independent Sit to Supine: Independent Transfers Sit to Stand: Independent Stand to Sit: Supervision/Verbal cueing Stand Pivot Transfers: Supervision/Verbal cueing Locomotion   Gait on level surfaces >143f w/supervision, mild unsteadiness w/turning, inattentio to R environment. Stairs ascends/descends 12 steps w/single rail and supervision Gait on uneven surfaces w/cga, mild balance loss w/self recovery, inattention to R environment requires cues to attend. Trunk/Postural Assessment   WNLs  Balance Balance Balance Assessed: Yes Standardized Balance Assessment Standardized Balance Assessment: Berg Balance Test Berg Balance Test Sit to Stand: Able to stand without using hands and stabilize independently Standing Unsupported: Able to stand safely 2 minutes Sitting with Back Unsupported but Feet Supported on Floor or Stool: Able to sit safely and securely 2 minutes Stand to Sit: Sits safely with minimal use of hands Transfers: Able to transfer safely, minor use of hands Standing Unsupported with Eyes Closed: Able to stand 10 seconds safely Standing Ubsupported with Feet Together: Able to place feet together independently but unable to hold for 30 seconds From Standing,  Reach Forward with Outstretched Arm: Can reach confidently >25 cm (10") From Standing Position, Pick up Object from Floor: Able to pick up shoe safely and easily From Standing Position, Turn to Look Behind Over each Shoulder: Looks behind from both sides and weight shifts well Turn 360 Degrees: Able to turn 360 degrees safely in 4 seconds or less Standing Unsupported, Alternately Place Feet on Step/Stool: Able to complete 4 steps without aid or supervision Standing Unsupported, One Foot in Front: Needs help to step but can hold 15 seconds Standing on One Leg: Able to lift leg independently and hold 5-10 seconds Total Score: 48 Extremity Assessment  RUE Assessment Passive Range of Motion (PROM) Comments: WNL Active Range of Motion (AROM) Comments: WNL General Strength Comments: shoulder 4+/% wrist/elbow 5/5 LUE Assessment LUE Assessment: Within Functional Limits RLE Assessment RLE Assessment: Within Functional Limits General Strength Comments: 4+/5 hip flexion and extension, otherwise 5/5 LLE Assessment LLE Assessment: Within Functional Limits   BCallie Fielding PT  BJerrilyn Cairo7/08/2021, 11:16 AM

## 2021-04-01 NOTE — Discharge Summary (Signed)
Physician Discharge Summary  Patient ID: Casey Branch MRN: [id number] DOB/AGE: 21-Aug-1981 40 y.o.  Admit date: 03/19/2021 Discharge date: 04/02/2021  Discharge Diagnoses:  Principal Problem:   Intraventricular hemorrhage (HCC) Active Problems:   ICH (intracerebral hemorrhage) (HCC) Superficial vein thrombosis right upper extremity involving right cephalic vein and age-indeterminate DVT right axillary vein Insomnia Acute hypoxic respiratory failure MSSA trach aspirate and group C strep Incidental findings COVID-19 History of tobacco abuse Constipation  Discharged Condition: Stable  Significant Diagnostic Studies: CT ANGIO HEAD NECK W WO CM  Result Date: 03/08/2021 CLINICAL DATA:  Intracranial hemorrhage. EXAM: CT ANGIOGRAPHY HEAD AND NECK TECHNIQUE: Multidetector CT imaging of the head and neck was performed using the standard protocol during bolus administration of intravenous contrast. Multiplanar CT image reconstructions and MIPs were obtained to evaluate the vascular anatomy. Carotid stenosis measurements (when applicable) are obtained utilizing NASCET criteria, using the distal internal carotid diameter as the denominator. CONTRAST:  OMNIPAQUE IOHEXOL 350 MG/ML SOLN COMPARISON:  CT head 03/08/2021 FINDINGS: CTA NECK FINDINGS Aortic arch: Normal aortic arch. Proximal great vessels widely patent. Bovine arch. Right carotid system: Normal right carotid without atherosclerotic disease or dissection. Left carotid system: Normal left carotid. Negative for atherosclerotic disease or dissection. Vertebral arteries: Both vertebral arteries widely patent without stenosis. Skeleton: Poor dentition with caries. Negative cervical spine. Bifrontal burr holes for shunt catheter placement. Other neck: Negative Upper chest: Negative Review of the MIP images confirms the above findings CTA HEAD FINDINGS Anterior circulation: Cavernous carotid normal bilaterally. Anterior and middle cerebral arteries  widely patent without stenosis or large vessel occlusion. Abnormal cluster of enlarged vessels in the left lateral ventricle with large draining vein compatible with AVM. This appears to be supplied via multiple vessels including the anterior choroidal artery on the left. Primary AVM supply appears to be supplied from the left posterior cerebral artery. The nidus appears to be within the left lateral ventricle and may be associated with the choroid plexus. The nidus measures approximately 35 x 28 mm in diameter. Posterior circulation: Both vertebral arteries patent to the basilar. PICA patent bilaterally. Basilar widely patent. Superior cerebellar and posterior cerebral arteries patent bilaterally. Enlarged left posterior cerebral artery which appears to be supplying the AVM in the left lateral ventricle. Venous sinuses: Normal enhancement of the dural venous sinuses. Enlarged draining veins in the AVM with drainage to the deep venous system including the internal cerebral veins and along the straight sinus. There appear to be enlarged collateral veins along the straight sinus draining into the torcular. Review of the MIP images confirms the above findings IMPRESSION: 1. 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 AVM supply appears to be due to the left anterior choroidal artery and left posterior cerebral artery. Deep venous drainage along the course of the straight sinus. No cerebral aneurysm 2. No intracranial stenosis or large vessel occlusion. 3. These results were called by telephone at the time of interpretation on 03/08/2021 at 12:51 pm to provider [facility] , who verbally acknowledged these results. Electronically Signed   By: Marlan Palau M.D.   On: 03/08/2021 12:54   CT HEAD WO CONTRAST  Result Date: 03/08/2021 CLINICAL DATA:  Hydrocephalus. Status post placement of ventriculostomy catheter. EXAM: CT HEAD WITHOUT  CONTRAST TECHNIQUE: Contiguous axial images were obtained from the base of the skull through the vertex without intravenous contrast. COMPARISON:  CT head without contrast 03/08/2021 at 12:15 p.m. FINDINGS: Brain: Hemorrhage centered in the  left thalamus with intraventricular extension again noted. Left frontal ventriculostomy catheter in place. The anterior left ventricle is decompressed. Temporal horn of the left ventricle is slightly larger than on the prior study, measuring 19 mm compared with 17 mm. The anterior horn of the right lateral ventricle is also increasing in size, measuring up to 21 mm on coronal images compared with 16 mm on the prior study. Right temporal horn is enlarging as well. The hemorrhage results in mass effect extending into the upper brainstem. Subarachnoid blood noted over the tentorium and cerebellar folia. Hemorrhage is present in the third ventricles and aqueduct. Increasing sulcal effacement is noted over the convexities bilaterally. The fourth ventricle is patent. No new hemorrhage is present. Vascular: No hyperdense vessel or unexpected calcification. Skull: Calvarium intact part from the left frontal ventriculostomy catheter. Sinuses/Orbits: Mild mucosal thickening is present in the ethmoid air cells. No fluid levels are present. The paranasal sinuses and mastoid air cells are otherwise clear. The globes and orbits are within normal limits. Other: Lucencies noted about left maxillary tooth roots. IMPRESSION: 1. Slight increase in size of bilateral lateral ventricles, concerning for progressive hydrocephalus. 2. Left frontal ventriculostomy catheter in place. The anterior left ventricle is decompressed. 3. Increasing sulcal effacement over the convexities bilaterally consistent with increasing intracranial pressure. 4. Stable subarachnoid blood over the tentorium and cerebellar folia. 5. Increasing mass effect extending into the upper brainstem. 6. No new hemorrhage. Critical  Value/emergent results were called by telephone at the time of interpretation on 03/08/2021 at 6:37 pm to provider [facility] , who verbally acknowledged these results. Electronically Signed   By: Marin Roberts M.D.   On: 03/08/2021 18:37   IR 3D Independent Annabell Sabal  PROCEDURE: DIAGNOSTIC CEREBRAL ANGIOGRAM   HISTORY: The patient is a 40 year old man with a history of previous intracranial hemorrhage and ventriculoperitoneal shunting several years ago, presenting with worst headache of his life, confusion and loss of consciousness. CT scan demonstrated significant ventricular hemorrhage with left-sided thalamic and midbrain hemorrhage. CT angiogram confirmed the presence of a left thalamic arteriovenous malformation. Patient has been intubated with an external ventricular drain in the intensive care unit and presents today for further workup of the AVM with diagnostic cerebral angiogram.   ACCESS: The technical aspects of the procedure as well as its potential risks and benefits were reviewed with the patient's wife. These risks included but were not limited bleeding, infection, allergic reaction, damage to organs or vital structures, stroke, non-diagnostic procedure, and the catastrophic outcomes of heart attack, coma, and death. With an understanding of these risks, informed consent was obtained and witnessed. The patient was placed in the supine position on the angiography table and the skin of right groin prepped in the usual sterile fashion.   The procedure was performed under general anesthesia monitored by the anesthesia service. A 5- French sheath was introduced in the right common femoral artery using Seldinger technique. A fluoro-phase sequence was used to document the sheath position.   MEDICATIONS: HEPARIN: 0 Units total.   CONTRAST:  See IR records   FLUOROSCOPY TIME:  See IR records   TECHNIQUE: CATHETERS AND WIRES 5-French JB-1 catheter   0.035" glidewire   VESSELS CATHETERIZED Right  internal carotid   Left internal carotid   Left vertebral   Right vertebral   Right common femoral   VESSELS STUDIED Right internal carotid, head   Left internal carotid, head   Left vertebral   Left vertebral, 3D rotational angiogram   Right  vertebral   Right femoral   PROCEDURAL NARRATIVE A 5-Fr JB-1 catheter was advanced over a 0.035 glidewire into the aortic arch. The above vessels were then sequentially catheterized and cervical / cerebral angiograms taken. After review of images, the catheter was removed without incident.   FINDINGS: Right internal carotid, head:   Injection reveals the presence of a widely patent ICA, M1, and A1 segments and their branches. No aneurysms, AVMs, or high-flow fistulas are seen. The parenchymal and venous phases are normal. The venous sinuses are widely patent.   Left internal carotid, head:   Injection reveals the presence of a widely patent ICA, A1, and M1 segments and their branches. The left thalamic AVM, better delineated on the posterior circulation injections, is visualized through flow from a hypertrophic left posterior communicating artery. High frame rate angiogram does not suggest any supply to the arteriovenous malformation from the anterior or middle cerebral arteries. The parenchymal and venous phases are normal. The venous sinuses are widely patent.   Left vertebral:   Injection reveals the presence of a widely patent vertebral artery. This leads to a widely patent basilar artery that terminates in bilateral P1. The basilar apex is normal. The left posterior cerebral artery is hypertrophic and supplies a left thalamic arteriovenous malformation measuring approximately 1.6 x 3.5 x 1.6 cm in size. The AVM appears to be supplied almost exclusively through the left posterior cerebral artery. No feeding vessel aneurysms are identified. There do appear to be either intra-nidal or venous outflow aneurysms marked by mild contrast stasis. Venous outflow appears to be  through primarily a large medially and posteriorly directed draining vein. Subsequent venous outflow appears to be primarily through cortical veins and ultimately into the transverse sinus. Interestingly, the deep venous system appears to be hypoplastic, with the straight sinus not well visualized. The transverse and sigmoid venous sinuses are widely patent.   Left vertebral, 3D rotation   3-dimensional rotational angiographic images were reconstructed on an independent workstation for review. These further delineate the above described left thalamic arteriovenous malformation. The three-dimensional images confirm the presence of multiple small intra-nidal aneurysms. Venous outflow appears to again to be through primarily a large medially and posteriorly directed draining vein.   Right vertebral:   The vertebral artery is widely patent. No PICA aneurysm is seen. The left thalamic arteriovenous malformation is again visualized, better described from the left vertebral artery injection and three-dimensional rotational angiogram above.   Right femoral:   Normal vessel. No significant atherosclerotic disease. Arterial sheath in adequate position.   DISPOSITION: Upon completion of the study, the femoral sheath was removed and hemostasis obtained using a 5-Fr ExoSeal closure device. Good proximal and distal lower extremity pulses were documented upon achievement of hemostasis. The procedure was well tolerated and no early complications were observed. The patient was transferred to the holding area to lay flat for 2 hours.   IMPRESSION: 1. 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   The preliminary results of this procedure were shared with the patient and the patient's family.     Electronically Signed   By: Lisbeth Renshaw   On: 03/11/2021  15:07  DG CHEST PORT 1 VIEW  Result Date: 03/13/2021 CLINICAL DATA:  Fevers EXAM: PORTABLE CHEST 1 VIEW COMPARISON:  Film from earlier in the same day. FINDINGS: Cardiac shadow is stable. Shunt catheter is again  identified. Endotracheal tube and right jugular line have been removed. Lungs are well aerated bilaterally. Increasing central vascular congestion with interstitial edema is noted. Persistent right basilar infiltrate is seen. IMPRESSION: Stable right basilar infiltrate. Increasing vascular congestion and pulmonary edema. Electronically Signed   By: Alcide Clever M.D.   On: 03/13/2021 19:10   DG CHEST PORT 1 VIEW  Result Date: 03/13/2021 CLINICAL DATA:  Respiratory failure.  Hypoxia. EXAM: PORTABLE CHEST 1 VIEW COMPARISON:  Chest x-ray 03/10/2021.  Abdomen 03/09/2021. FINDINGS: What appears to be a feeding tube is noted overlying the left chest suggesting tube removal. Shunt tubing again noted over the left chest and abdomen. IJ line and endotracheal tube in stable position. Heart size stable. Persistent right base mild infiltrate. No pleural effusion or pneumothorax. IMPRESSION: 1. What appears to be a feeding tube is noted overlying the left chest suggesting tube removal. Remaining lines and tubes in stable position. 2.  Persistent right base mild infiltrate. Electronically Signed   By: Maisie Fus  Register   On: 03/13/2021 05:43   DG CHEST PORT 1 VIEW  Result Date: 03/10/2021 CLINICAL DATA:  Hypoxia EXAM: PORTABLE CHEST 1 VIEW COMPARISON:  03/09/2021 FINDINGS: Cardiac shadow is stable. Endotracheal tube and gastric catheter are again seen and stable. Right jugular central line and ventricular shunt catheter are again noted and stable. The lungs are well aerated bilaterally with increasing right basilar opacity consistent with infiltrate. No bony abnormality is noted. IMPRESSION: Tubes and lines as described stable in appearance. Developing infiltrate in the right base. Electronically Signed   By:  Alcide Clever M.D.   On: 03/10/2021 17:37   DG CHEST PORT 1 VIEW  Result Date: 03/09/2021 CLINICAL DATA:  Encounter for central line placement. Encounter for orogastric tube placement. EXAM: PORTABLE CHEST 1 VIEW COMPARISON:  Prior chest radiograph 03/08/2021. FINDINGS: As before, the ET tube terminates just below the level of the clavicular heads. Unchanged position of a right IJ approach central venous catheter with tip projecting in the region of the lower SVC. An enteric tube passes below the level left hemidiaphragm with tip projecting in the expected location of the stomach. A VP shunt projects over the paramedian left chest and left upper abdomen. Heart size within normal limits. No appreciable airspace consolidation. No evidence of pleural effusion or pneumothorax. Trace right basilar atelectasis. IMPRESSION: As before, the ET tube terminates just below the level of the clavicular heads. Additional lines and tubes as described. Trace right basilar atelectasis.  The lungs are otherwise clear. Electronically Signed   By: Jackey Loge DO   On: 03/09/2021 11:31   DG Chest Portable 1 View  Result Date: 03/08/2021 CLINICAL DATA:  Patient status post intubation. EXAM: PORTABLE CHEST 1 VIEW COMPARISON:  None. FINDINGS: ET tube mid trachea. Enteric tube courses inferior to the diaphragm. VP shunt catheter projects over the chest. Normal cardiac and mediastinal contours. No consolidative pulmonary opacities. No pleural effusion or pneumothorax. IMPRESSION: No acute cardiopulmonary process.  ET tube mid trachea. Electronically Signed   By: Annia Belt M.D.   On: 03/08/2021 13:13   DG Abd Portable 1V  Result Date: 03/14/2021 CLINICAL DATA:  Tube placement EXAM: PORTABLE ABDOMEN - 1 VIEW COMPARISON:  Chest radiograph 03/13/2021, abdominal radiograph 03/12/2021 FINDINGS: Feeding tube tip appears to have been repositioned now directed towards the gastric antrum. Shunt catheter tubing is again noted. Telemetry  leads and external support devices overlie chest and abdomen. Persistent patchy consolidative opacity in the bilateral lower lobes. No  high-grade obstructive bowel gas pattern is seen though this is not a complete view of the abdomen. No acute or worrisome osseous abnormalities. IMPRESSION: Repositioning of the feeding tube tip now directed towards the gastric antrum. Contiguous shunt catheter tubing noted. Bibasilar patchy opacities, could reflect airspace disease and/or edema. Electronically Signed   By: Kreg Shropshire M.D.   On: 03/14/2021 19:09   DG Abd Portable 1V  Result Date: 03/12/2021 CLINICAL DATA:  NG tube placement. EXAM: PORTABLE ABDOMEN - 1 VIEW COMPARISON:  March 09, 2021. FINDINGS: A single feeding tube is seen in the area of the stomach, tip in the fundus. The nasogastric tube that was present previously is no longer visible. There is consolidation at the LEFT lung base. A LEFT-sided ventriculoperitoneal shunt passes off the field of the radiograph. EKG leads project over the abdomen. No acute skeletal finding on limited assessment. Bowel gas pattern as visualized is unremarkable. IMPRESSION: A single feeding tube is seen in the area of the stomach, tip in the fundus. The nasogastric tube that was present on the previous study is no longer visible. Dense LEFT lower lobe consolidation. Electronically Signed   By: Donzetta Kohut M.D.   On: 03/12/2021 14:28   DG Abd Portable 1V  Result Date: 03/09/2021 CLINICAL DATA:  Encounter for central line placement. Encounter for orogastric tube placement. EXAM: PORTABLE ABDOMEN - 1 VIEW COMPARISON:  No pertinent prior exams available for comparison. FINDINGS: Problem oriented examination for OG tube placement. An enteric tube passes below the level of the left hemidiaphragm with tip projecting in the region of the stomach. The side port projects at the level of the GE junction. A VP shunt traverses the chest and left abdomen. Nonobstructive bowel gas  pattern. Minimal right basilar atelectasis. IMPRESSION: Enteric tube with tip projecting in the expected location of the stomach. The side port projects in the expected location of the GE junction. Nonobstructive bowel gas pattern. Electronically Signed   By: Jackey Loge DO   On: 03/09/2021 11:32   DG Swallowing Func-Speech Pathology  Result Date: 03/17/2021 Formatting of this result is different from the original. Objective Swallowing Evaluation: Type of Study: MBS-Modified Barium Swallow Study  Patient Details Name: Casey Branch MRN: [id number] Date of Birth: [birth year] Today's Date: 03/17/2021 Time: SLP Start Time (ACUTE ONLY): 1230 -SLP Stop Time (ACUTE ONLY): 1300 SLP Time Calculation (min) (ACUTE ONLY): 30 min Past Medical History: Past Medical History: Diagnosis Date  Brain aneurysm  Past Surgical History: Past Surgical History: Procedure Laterality Date  IR 3D INDEPENDENT WKST  03/11/2021  IR ANGIO INTRA EXTRACRAN SEL COM CAROTID INNOMINATE BILAT MOD SED  03/11/2021  IR ANGIO VERTEBRAL SEL VERTEBRAL BILAT MOD SED  03/11/2021  RADIOLOGY WITH ANESTHESIA N/A 03/11/2021  Procedure: IR WITH ANESTHESIA - DIAGNOSTIC CEREBRAL ANGIOGRAM;  Surgeon: Lisbeth Renshaw, MD;  Location: [facility] OR;  Service: Radiology;  Laterality: N/A;  VENTRICULOPERITONEAL SHUNT   HPI: Pt is a 40 yo male who presented to [facility] ED with AMS and unilateral R-sided weakness after a syncopal event at home (heard by wife, found patient on floor). Code Stroke was initiated on arrival to ED.  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 S/p R frontal ventriculostomy 6/18. Pt intubated 6/18-24.  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 CXR 6/23 concerning for RLL infiltrate.  Cortrak placed 6/24.  Subjective: alert and participatory Assessment / Plan / Recommendation CHL IP CLINICAL IMPRESSIONS 03/17/2021 Clinical  Impression Pt presents with excellent oropharyngeal swallow function with  adequate mastication, timely pharyngeal swallow response, reliable laryngeal vestibule closure with no penetration nor aspiration, no residue post-swallow.  Recommend initiating a regular diet; thin liquids. Pt did have difficulty with manipulating 13 mm barium pill - (give whole with puree) - and difficulty following some instructions for participation.  SLP will follow-up x1 to ensure safety; otherwise will follow for cognitive-communication. SLP Visit Diagnosis Dysphagia, unspecified (R13.10) Attention and concentration deficit following -- Frontal lobe and executive function deficit following -- Impact on safety and function No limitations   CHL IP TREATMENT RECOMMENDATION 03/17/2021 Treatment Recommendations Therapy as outlined in treatment plan below   Prognosis 03/15/2021 Prognosis for Safe Diet Advancement (No Data) Barriers to Reach Goals -- Barriers/Prognosis Comment -- CHL IP DIET RECOMMENDATION 03/17/2021 SLP Diet Recommendations Regular solids;Thin liquid Liquid Administration via Cup;Straw Medication Administration Whole meds with puree Compensations Minimize environmental distractions Postural Changes --   CHL IP OTHER RECOMMENDATIONS 03/17/2021 Recommended Consults -- Oral Care Recommendations Oral care BID Other Recommendations --   CHL IP FOLLOW UP RECOMMENDATIONS 03/17/2021 Follow up Recommendations Inpatient Rehab   CHL IP FREQUENCY AND DURATION 03/17/2021 Speech Therapy Frequency (ACUTE ONLY) min 1 x/week Treatment Duration 1 week      CHL IP ORAL PHASE 03/17/2021 Oral Phase WFL Oral - Pudding Teaspoon -- Oral - Pudding Cup -- Oral - Honey Teaspoon -- Oral - Honey Cup -- Oral - Nectar Teaspoon -- Oral - Nectar Cup -- Oral - Nectar Straw -- Oral - Thin Teaspoon -- Oral - Thin Cup -- Oral - Thin Straw -- Oral - Puree -- Oral - Mech Soft -- Oral - Regular -- Oral - Multi-Consistency -- Oral - Pill -- Oral Phase - Comment --  CHL IP PHARYNGEAL PHASE 03/17/2021 Pharyngeal Phase WFL Pharyngeal- Pudding  Teaspoon -- Pharyngeal -- Pharyngeal- Pudding Cup -- Pharyngeal -- Pharyngeal- Honey Teaspoon -- Pharyngeal -- Pharyngeal- Honey Cup -- Pharyngeal -- Pharyngeal- Nectar Teaspoon -- Pharyngeal -- Pharyngeal- Nectar Cup -- Pharyngeal -- Pharyngeal- Nectar Straw -- Pharyngeal -- Pharyngeal- Thin Teaspoon -- Pharyngeal -- Pharyngeal- Thin Cup -- Pharyngeal -- Pharyngeal- Thin Straw -- Pharyngeal -- Pharyngeal- Puree -- Pharyngeal -- Pharyngeal- Mechanical Soft -- Pharyngeal -- Pharyngeal- Regular -- Pharyngeal -- Pharyngeal- Multi-consistency -- Pharyngeal -- Pharyngeal- Pill -- Pharyngeal -- Pharyngeal Comment --  CHL IP CERVICAL ESOPHAGEAL PHASE 03/17/2021 Cervical Esophageal Phase WFL Pudding Teaspoon -- Pudding Cup -- Honey Teaspoon -- Honey Cup -- Nectar Teaspoon -- Nectar Cup -- Nectar Straw -- Thin Teaspoon -- Thin Cup -- Thin Straw -- Puree -- Mechanical Soft -- Regular -- Multi-consistency -- Pill -- Cervical Esophageal Comment -- Blenda Mounts Laurice 03/17/2021, 1:10 PM              CT HEAD CODE STROKE WO CONTRAST  Result Date: 03/08/2021 CLINICAL DATA:  Code stroke.  Acute neuro deficit. EXAM: CT HEAD WITHOUT CONTRAST TECHNIQUE: Contiguous axial images were obtained from the base of the skull through the vertex without intravenous contrast. COMPARISON:  None. FINDINGS: Brain: Intraventricular hemorrhage. Large amount of blood fills the left lateral ventricle. Small amount of blood in the right lateral ventricle and third ventricle. No blood in the fourth ventricle. Increased density of the tentorium bilaterally which may be due to blood along the tentorium. Small amount of subarachnoid hemorrhage in the region of the distal basilar artery. Left frontal ventricular peritoneal shunt tubing. Mild ventricular enlargement which may be due to acute hydrocephalus from intraventricular hemorrhage.  No acute infarct or mass. Shunt tract right frontal bone with calcification in the right frontal lobe along the  tract of the removed shunt. Vascular: High-density clot near the tip of the basilar which could be due to aneurysmal rupture. Otherwise no hyperdense vessel Skull: Bifrontal burr holes for shunt placement. Right shunt has been removed. Sinuses/Orbits: Mild mucosal edema paranasal sinuses. Negative orbit Other: None ASPECTS ([place] Stroke Program Early CT Score) Aspect score not calculated due to acute intracranial hemorrhage IMPRESSION: 1. Large volume intraventricular hemorrhage primarily in the left lateral ventricle but also in the right ventricle and third ventricle. There is clot adjacent to the tip of the basilar. 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 Hypertensive hemorrhage also possible. 2. Increased density and mild increase in thickness in the tentorium bilaterally which may be due to layering blood. 3. Mild hydrocephalus. Left frontal ventricular peritoneal shunt catheter. 4. These results were called by telephone at the time of interpretation on 03/08/2021 at 12:27 pm to provider JOSHUA LONG , who verbally acknowledged these results. Electronically Signed   By: Marlan Palau M.D.   On: 03/08/2021 12:29   VAS [place] LOWER EXTREMITY VENOUS (DVT)  Result Date: 03/14/2021  Lower Venous DVT Study Patient Name:  Casey Branch  Date of Exam:   03/14/2021 Medical Rec #: [id number]     Accession #:    [id number] Date of Birth: [birth year]    Patient Gender: M Patient Age:   39Y Exam Location:  [facility] Procedure:      VAS [place] LOWER EXTREMITY VENOUS (DVT) Referring Phys: [id number] PAULA B SIMPSON --------------------------------------------------------------------------------  Indications: Upper extremity positive for DVT. Patient cannot be anticoagulated, eval for filter placement.  Limitations: Bandaging at RT CFV, unable to reposition. Comparison Study: No prior lower venous studies. Performing Technologist: Jean Rosenthal RDMS,RVT  Examination Guidelines: A complete evaluation includes B-mode imaging,  spectral Doppler, color Doppler, and power Doppler as needed of all accessible portions of each vessel. Bilateral testing is considered an integral part of a complete examination. Limited examinations for reoccurring indications may be performed as noted. The reflux portion of the exam is performed with the patient in reverse Trendelenburg.  +---------+---------------+---------+-----------+----------+--------------+ RIGHT    CompressibilityPhasicitySpontaneityPropertiesThrombus Aging +---------+---------------+---------+-----------+----------+--------------+ CFV      Full           Yes      Yes                                 +---------+---------------+---------+-----------+----------+--------------+ SFJ      Full                                                        +---------+---------------+---------+-----------+----------+--------------+ FV Prox  Full                                                        +---------+---------------+---------+-----------+----------+--------------+ FV Mid   Full                                                        +---------+---------------+---------+-----------+----------+--------------+  FV DistalFull                                                        +---------+---------------+---------+-----------+----------+--------------+ PFV      Full                                                        +---------+---------------+---------+-----------+----------+--------------+ POP      Full           Yes      Yes                                 +---------+---------------+---------+-----------+----------+--------------+ PTV      Full                                                        +---------+---------------+---------+-----------+----------+--------------+ PERO     Full                                                        +---------+---------------+---------+-----------+----------+--------------+    +---------+---------------+---------+-----------+----------+--------------+ LEFT     CompressibilityPhasicitySpontaneityPropertiesThrombus Aging +---------+---------------+---------+-----------+----------+--------------+ CFV      Full           Yes      Yes                                 +---------+---------------+---------+-----------+----------+--------------+ SFJ      Full                                                        +---------+---------------+---------+-----------+----------+--------------+ FV Prox  Full                                                        +---------+---------------+---------+-----------+----------+--------------+ FV Mid   Full                                                        +---------+---------------+---------+-----------+----------+--------------+ FV DistalFull                                                        +---------+---------------+---------+-----------+----------+--------------+  PFV      Full                                                        +---------+---------------+---------+-----------+----------+--------------+ POP      Full           Yes      Yes                                 +---------+---------------+---------+-----------+----------+--------------+ PTV      Full                                                        +---------+---------------+---------+-----------+----------+--------------+ PERO     Full                                                        +---------+---------------+---------+-----------+----------+--------------+     Summary: RIGHT: - There is no evidence of deep vein thrombosis in the lower extremity.  - No cystic structure found in the popliteal fossa.  LEFT: - There is no evidence of deep vein thrombosis in the lower extremity.  - No cystic structure found in the popliteal fossa.  *See table(s) above for measurements and observations. Electronically signed  by Lemar Livings MD on 03/14/2021 at 2:42:32 PM.    Final    VAS [place] UPPER EXTREMITY VENOUS DUPLEX  Result Date: 03/14/2021 UPPER VENOUS STUDY  Patient Name:  Casey Branch  Date of Exam:   03/14/2021 Medical Rec #: [id number]     Accession #:    [id number] Date of Birth: 1981-09-18    Patient Gender: M Patient Age:   39Y Exam Location:  [facility] Procedure:      VAS [place] UPPER EXTREMITY VENOUS DUPLEX Referring Phys: [id number] PAULA B SIMPSON --------------------------------------------------------------------------------  Indications: Asymmetrical swelling RT arm Other Indications: Covid-19. Limitations: Patient unable/unwilling to reposition. Comparison Study: No prior studies. Performing Technologist: Jean Rosenthal RDMS,RVT  Examination Guidelines: A complete evaluation includes B-mode imaging, spectral Doppler, color Doppler, and power Doppler as needed of all accessible portions of each vessel. Bilateral testing is considered an integral part of a complete examination. Limited examinations for reoccurring indications may be performed as noted.  Right Findings: +----------+------------+---------+-----------+----------+-----------------+ RIGHT     CompressiblePhasicitySpontaneousProperties     Summary      +----------+------------+---------+-----------+----------+-----------------+ IJV           Full       Yes       Yes                                +----------+------------+---------+-----------+----------+-----------------+ Subclavian    Full       Yes       Yes                                +----------+------------+---------+-----------+----------+-----------------+  Axillary      None       No        No               Age Indeterminate +----------+------------+---------+-----------+----------+-----------------+ Brachial      Full       Yes       Yes                                +----------+------------+---------+-----------+----------+-----------------+ Radial         Full                                                    +----------+------------+---------+-----------+----------+-----------------+ Ulnar       Partial      Yes       Yes                    Acute       +----------+------------+---------+-----------+----------+-----------------+ Cephalic      None       No        No                     Acute       +----------+------------+---------+-----------+----------+-----------------+ Basilic       Full       Yes       Yes                                +----------+------------+---------+-----------+----------+-----------------+  Left Findings: +----------+------------+---------+-----------+----------+-------+ LEFT      CompressiblePhasicitySpontaneousPropertiesSummary +----------+------------+---------+-----------+----------+-------+ Subclavian               Yes       Yes                      +----------+------------+---------+-----------+----------+-------+  Summary:  Right: Findings consistent with acute deep vein thrombosis involving a single right ulnar vein at the proximal forearm. Findings consistent with acute superficial vein thrombosis involving the right cephalic vein. Findings consistent with age indeterminate deep vein thrombosis involving the right axillary vein.  *See table(s) above for measurements and observations.  Diagnosing physician: Lemar Livings MD Electronically signed by Lemar Livings MD on 03/14/2021 at 2:43:58 PM.    Final    ECHOCARDIOGRAM LIMITED  Result Date: 03/14/2021    ECHOCARDIOGRAM LIMITED REPORT   Patient Name:   Casey Branch Date of Exam: 03/14/2021 Medical Rec #:  [id number]    Height:       70.0 in Accession #:    [id number]   Weight:       182.8 lb Date of Birth:  January 13, 1981   BSA:          2.009 m Patient Age:    39 years     BP:           125/73 mmHg Patient Gender: M            HR:           45 bpm. Exam Location:  Inpatient Procedure: 3D Echo, Limited Echo, Cardiac Doppler and Color Doppler  Indications:    Stroke  History:        Patient has  no prior history of Echocardiogram examinations.                 Stroke. Covid positive.  Sonographer:    Sheralyn Boatman RDCS Referring Phys: [id number] Lorin Glass  Sonographer Comments: Patient moving throughout echo. IMPRESSIONS  1. Limited study; full doppler not performed.  2. Left ventricular ejection fraction, by estimation, is 50 to 55%. The left ventricle has low normal function. The left ventricle has no regional wall motion abnormalities. Left ventricular diastolic function could not be evaluated.  3. Right ventricular systolic function is normal. The right ventricular size is normal. There is normal pulmonary artery systolic pressure.  4. The mitral valve is normal in structure. No evidence of mitral valve regurgitation.  5. The aortic valve is tricuspid. Mild aortic valve sclerosis is present, with no evidence of aortic valve stenosis.  6. The inferior vena cava is normal in size with greater than 50% respiratory variability, suggesting right atrial pressure of 3 mmHg. FINDINGS  Left Ventricle: Left ventricular ejection fraction, by estimation, is 50 to 55%. The left ventricle has low normal function. The left ventricle has no regional wall motion abnormalities. The left ventricular internal cavity size was normal in size. There is no left ventricular hypertrophy. Left ventricular diastolic function could not be evaluated. Right Ventricle: The right ventricular size is normal. Right ventricular systolic function is normal. There is normal pulmonary artery systolic pressure. The tricuspid regurgitant velocity is 1.71 m/s, and with an assumed right atrial pressure of 3 mmHg,  the estimated right ventricular systolic pressure is 14.7 mmHg. Left Atrium: Left atrial size was normal in size. Right Atrium: Right atrial size was normal in size. Pericardium: Trivial pericardial effusion is present. Mitral Valve: The mitral valve is normal in structure. Tricuspid  Valve: The tricuspid valve is normal in structure. Tricuspid valve regurgitation is mild. Aortic Valve: The aortic valve is tricuspid. Mild aortic valve sclerosis is present, with no evidence of aortic valve stenosis. Pulmonic Valve: The pulmonic valve was normal in structure. Aorta: The aortic root is normal in size and structure. Venous: The inferior vena cava is normal in size with greater than 50% respiratory variability, suggesting right atrial pressure of 3 mmHg. IAS/Shunts: No atrial level shunt detected by color flow Doppler. Additional Comments: Limited study; full doppler not performed. LEFT VENTRICLE PLAX 2D LVIDd:         4.70 cm LVIDs:         3.00 cm LV PW:         1.10 cm LV IVS:        0.90 cm  LV Volumes (MOD) LV vol d, MOD A2C: 85.8 ml LV vol d, MOD A4C: 109.0 ml LV vol s, MOD A2C: 26.5 ml LV vol s, MOD A4C: 40.6 ml LV SV MOD A2C:     59.3 ml LV SV MOD A4C:     109.0 ml LV SV MOD BP:      66.2 ml IVC IVC diam: 2.00 cm LEFT ATRIUM         Index      RIGHT ATRIUM           Index LA diam:    3.50 cm 1.74 cm/m RA Area:     14.00 cm                                RA Volume:   37.30 ml  18.57 ml/m   AORTA Ao Root diam: 3.20 cm TRICUSPID VALVE TR Peak grad:   11.7 mmHg TR Vmax:        171.00 cm/s Olga Millers MD Electronically signed by Olga Millers MD Signature Date/Time: 03/14/2021/3:49:02 PM    Final    IR ANGIO INTRA EXTRACRAN SEL COM CAROTID INNOMINATE BILAT MOD SED  Result Date: 03/11/2021 PROCEDURE: DIAGNOSTIC CEREBRAL ANGIOGRAM HISTORY: The patient is a 40 year old man with a history of previous intracranial hemorrhage and ventriculoperitoneal shunting several years ago, presenting with worst headache of his life, confusion and loss of consciousness. CT scan demonstrated significant ventricular hemorrhage with left-sided thalamic and midbrain hemorrhage. CT angiogram confirmed the presence of a left thalamic arteriovenous malformation. Patient has been intubated with an external  ventricular drain in the intensive care unit and presents today for further workup of the AVM with diagnostic cerebral angiogram. ACCESS: The technical aspects of the procedure as well as its potential risks and benefits were reviewed with the patient's wife. These risks included but were not limited bleeding, infection, allergic reaction, damage to organs or vital structures, stroke, non-diagnostic procedure, and the catastrophic outcomes of heart attack, coma, and death. With an understanding of these risks, informed consent was obtained and witnessed. The patient was placed in the supine position on the angiography table and the skin of right groin prepped in the usual sterile fashion. The procedure was performed under general anesthesia monitored by the anesthesia service. A 5- French sheath was introduced in the right common femoral artery using Seldinger technique. A fluoro-phase sequence was used to document the sheath position. MEDICATIONS: HEPARIN: 0 Units total. CONTRAST:  See IR records FLUOROSCOPY TIME:  See IR records TECHNIQUE: CATHETERS AND WIRES 5-French JB-1 catheter 0.035" glidewire VESSELS CATHETERIZED Right internal carotid Left internal carotid Left vertebral Right vertebral Right common femoral VESSELS STUDIED Right internal carotid, head Left internal carotid, head Left vertebral Left vertebral, 3D rotational angiogram Right vertebral Right femoral PROCEDURAL NARRATIVE A 5-Fr JB-1 catheter was advanced over a 0.035 glidewire into the aortic arch. The above vessels were then sequentially catheterized and cervical / cerebral angiograms taken. After review of images, the catheter was removed without incident. FINDINGS: Right internal carotid, head: Injection reveals the presence of a widely patent ICA, M1, and A1 segments and their branches. No aneurysms, AVMs, or high-flow fistulas are seen. The parenchymal and venous phases are normal. The venous sinuses are widely patent. Left internal  carotid, head: Injection reveals the presence of a widely patent ICA, A1, and M1 segments and their branches. The left thalamic AVM, better delineated on the posterior circulation injections, is visualized through flow from a hypertrophic left posterior communicating artery. High frame rate angiogram does not suggest any supply to the arteriovenous malformation from the anterior or middle cerebral arteries. The parenchymal and venous phases are normal. The venous sinuses are widely patent. Left vertebral: Injection reveals the presence of a widely patent vertebral artery. This leads to a widely patent basilar artery that terminates in bilateral P1. The basilar apex is normal. The left posterior cerebral artery is hypertrophic and supplies a left thalamic arteriovenous malformation measuring approximately 1.6 x 3.5 x 1.6 cm in size. The AVM appears to be supplied almost exclusively through the left posterior cerebral artery. No feeding vessel aneurysms are identified. There do appear to be either intra-nidal or venous outflow aneurysms marked by mild contrast stasis. Venous outflow appears to be through primarily a large medially and posteriorly directed draining vein. Subsequent  venous outflow appears to be primarily through cortical veins and ultimately into the transverse sinus. Interestingly, the deep venous system appears to be hypoplastic, with the straight sinus not well visualized. The transverse and sigmoid venous sinuses are widely patent. Left vertebral, 3D rotation 3-dimensional rotational angiographic images were reconstructed on an independent workstation for review. These further delineate the above described left thalamic arteriovenous malformation. The three-dimensional images confirm the presence of multiple small intra-nidal aneurysms. Venous outflow appears to again to be through primarily a large medially and posteriorly directed draining vein. Right vertebral: The vertebral artery is widely  patent. No PICA aneurysm is seen. The left thalamic arteriovenous malformation is again visualized, better described from the left vertebral artery injection and three-dimensional rotational angiogram above. Right femoral: Normal vessel. No significant atherosclerotic disease. Arterial sheath in adequate position. DISPOSITION: Upon completion of the study, the femoral sheath was removed and hemostasis obtained using a 5-Fr ExoSeal closure device. Good proximal and distal lower extremity pulses were documented upon achievement of hemostasis. The procedure was well tolerated and no early complications were observed. The patient was transferred to the holding area to lay flat for 2 hours. IMPRESSION: 1. 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 The preliminary results of this procedure were shared with the patient and the patient's family. Electronically Signed   By: Lisbeth Renshaw   On: 03/11/2021 15:07   IR ANGIO VERTEBRAL SEL VERTEBRAL BILAT MOD SED  PROCEDURE: DIAGNOSTIC CEREBRAL ANGIOGRAM   HISTORY: The patient is a 40 year old man with a history of previous intracranial hemorrhage and ventriculoperitoneal shunting several years ago, presenting with worst headache of his life, confusion and loss of consciousness. CT scan demonstrated significant ventricular hemorrhage with left-sided thalamic and midbrain hemorrhage. CT angiogram confirmed the presence of a left thalamic arteriovenous malformation. Patient has been intubated with an external ventricular drain in the intensive care unit and presents today for further workup of the AVM with diagnostic cerebral angiogram.   ACCESS: The technical aspects of the procedure as well as its potential risks and benefits were reviewed with the patient's wife. These risks included but were  not limited bleeding, infection, allergic reaction, damage to organs or vital structures, stroke, non-diagnostic procedure, and the catastrophic outcomes of heart attack, coma, and death. With an understanding of these risks, informed consent was obtained and witnessed. The patient was placed in the supine position on the angiography table and the skin of right groin prepped in the usual sterile fashion.   The procedure was performed under general anesthesia monitored by the anesthesia service. A 5- French sheath was introduced in the right common femoral artery using Seldinger technique. A fluoro-phase sequence was used to document the sheath position.   MEDICATIONS: HEPARIN: 0 Units total.   CONTRAST:  See IR records   FLUOROSCOPY TIME:  See IR records   TECHNIQUE: CATHETERS AND WIRES 5-French JB-1 catheter   0.035" glidewire   VESSELS CATHETERIZED Right internal carotid   Left internal carotid   Left vertebral   Right vertebral   Right common femoral   VESSELS STUDIED Right internal carotid, head   Left internal carotid, head   Left vertebral   Left vertebral, 3D rotational angiogram   Right vertebral   Right femoral   PROCEDURAL NARRATIVE A 5-Fr JB-1 catheter was advanced over a 0.035 glidewire into the aortic arch. The above  vessels were then sequentially catheterized and cervical / cerebral angiograms taken. After review of images, the catheter was removed without incident.   FINDINGS: Right internal carotid, head:   Injection reveals the presence of a widely patent ICA, M1, and A1 segments and their branches. No aneurysms, AVMs, or high-flow fistulas are seen. The parenchymal and venous phases are normal. The venous sinuses are widely patent.   Left internal carotid, head:   Injection reveals the presence of a widely patent ICA, A1, and M1 segments and their branches. The left thalamic AVM, better delineated on the posterior circulation injections, is visualized through flow from a hypertrophic left posterior  communicating artery. High frame rate angiogram does not suggest any supply to the arteriovenous malformation from the anterior or middle cerebral arteries. The parenchymal and venous phases are normal. The venous sinuses are widely patent.   Left vertebral:   Injection reveals the presence of a widely patent vertebral artery. This leads to a widely patent basilar artery that terminates in bilateral P1. The basilar apex is normal. The left posterior cerebral artery is hypertrophic and supplies a left thalamic arteriovenous malformation measuring approximately 1.6 x 3.5 x 1.6 cm in size. The AVM appears to be supplied almost exclusively through the left posterior cerebral artery. No feeding vessel aneurysms are identified. There do appear to be either intra-nidal or venous outflow aneurysms marked by mild contrast stasis. Venous outflow appears to be through primarily a large medially and posteriorly directed draining vein. Subsequent venous outflow appears to be primarily through cortical veins and ultimately into the transverse sinus. Interestingly, the deep venous system appears to be hypoplastic, with the straight sinus not well visualized. The transverse and sigmoid venous sinuses are widely patent.   Left vertebral, 3D rotation   3-dimensional rotational angiographic images were reconstructed on an independent workstation for review. These further delineate the above described left thalamic arteriovenous malformation. The three-dimensional images confirm the presence of multiple small intra-nidal aneurysms. Venous outflow appears to again to be through primarily a large medially and posteriorly directed draining vein.   Right vertebral:   The vertebral artery is widely patent. No PICA aneurysm is seen. The left thalamic arteriovenous malformation is again visualized, better described from the left vertebral artery injection and three-dimensional rotational angiogram above.   Right femoral:   Normal vessel. No  significant atherosclerotic disease. Arterial sheath in adequate position.   DISPOSITION: Upon completion of the study, the femoral sheath was removed and hemostasis obtained using a 5-Fr ExoSeal closure device. Good proximal and distal lower extremity pulses were documented upon achievement of hemostasis. The procedure was well tolerated and no early complications were observed. The patient was transferred to the holding area to lay flat for 2 hours.   IMPRESSION: 1. 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   The preliminary results of this procedure were shared with the patient and the patient's family.     Electronically Signed   By: Lisbeth Renshaw   On: 03/11/2021 15:07   Labs:  Basic Metabolic Panel: Recent Labs  Lab 03/29/21 0510 03/31/21 0844  NA 135 133*  K 4.3 4.0  CL 101 99  CO2 26 28  GLUCOSE 86 95  BUN 14 14  CREATININE 0.91 1.02  CALCIUM 9.3 8.9    CBC: Recent Labs  Lab 03/29/21 0510 03/31/21 0844  WBC 5.4  5.5  NEUTROABS 2.6  --   HGB 13.9 13.2  HCT 39.5 38.8*  MCV 90.0 90.4  PLT 409* 366    CBG: No results for input(s): GLUCAP in the last 168 hours.  Brief HPI:   Casey Branch is a 40 y.o. right-handed male with past medical history significant for tobacco use 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  Per chart review lives with spouse independent prior to admission.  Presented 03/08/2021 with altered mental status right side weakness slurred speech nausea vomiting of acute onset with left gaze preference.  There was reported unwitnessed fall.  He was intubated for airway protection.  Admission chemistries unremarkable except potassium 3.4 glucose 138 SARS coronavirus positive.  Cranial CT scan showed large volume intraventricular hemorrhage primarily in the  left lateral ventricle but also in the right ventricle and third ventricle.  Increased density and mild increase in thickness in the tentorium bilaterally possibly with layering of blood.  Mild hydrocephalus.  CT angiogram of head and neck no emergent large vessel occlusion or stenosis.  No cerebral aneurysm.  Initially placed on 3% hypertonic saline.  Echocardiogram with ejection fraction of 50 to 55% no wall motion abnormalities.  Patient I went right frontal ventriculostomy 03/08/2021 per Dr. Johnsie Cancel followed by diagnostic cerebral angiogram per Dr. 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  Patient extubated 03/13/2021.  Venous Doppler studies right upper extremity showed a small acute superficial vein thrombosis involving the right cephalic vein and age-indeterminate DVT right axillary vein.  He was cleared to begin subcutaneous heparin for DVT prophylaxis and would not need long-term anticoagulation on discharge.  Venous Doppler studies of lower extremities negative.  Patient did complete a 7-day course of antibiotics for MSSA trach aspirate and group C strep.  In regards to patient's incidental findings of COVID-19 was treated airborne precautions until 03/16/2021 and discontinued.  Due to patient's agitation and restlessness he was initially placed on low-dose Precedex.  Therapy evaluations completed due to patient decreased functional mobility was admitted for a comprehensive rehab program.   Hospital Course: Casey Branch was admitted to rehab 03/19/2021 for inpatient therapies to consist of PT, ST and OT at least three hours five days a week. Past admission physiatrist, therapy team and rehab RN have worked together to provide customized collaborative inpatient rehab.  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  Status post EVD removed 03/13/2021.  Patient would follow-up neurosurgery.  He did have some slurred speech  left gaze preference.  In regards to patient's superficial vein thrombosis right upper extremity involving right cephalic vein and age-indeterminate DVT right axillary vein lower extremity Dopplers negative.  He was maintained on subcutaneous heparin for DVT prophylaxis no plan for long-term anticoagulation at discharge.  Bouts of insomnia working with sleep schedule melatonin had been used as needed.  Acute hypoxic respiratory failure extubated 03/13/2021 oxygen saturations greater than 90%.  He had completed 7-day course antibiotics for MSSA trach aspirate and group C strep.  Incidental findings COVID-19 Airborne precautions completed 03/16/2021 patient mains afebrile.  He did have a history of tobacco abuse receiving counsel guards to cessation of nicotine products.  Bouts of agitation restlessness initially on Precedex as well as bouts of impaired arousal maintained on Provigil with the addition of Aricept.   Blood pressures were monitored on TID basis and controlled     Rehab course: During patient's stay in rehab weekly team conferences were held to monitor  patient's progress, set goals and discuss barriers to discharge. At admission, patient required minimal assist stand pivot transfers mod max assist 25 feet rolling walker moderate assist supine to sit max is lower body dressing +2 physical assist with toilet transfers  Physical exam.  Blood pressure 136/71 pulse 61 temperature 96 respiration 19 oxygen saturations 92% room air Constitutional.  Somewhat somnolent displayed impaired initiation and attention HEENT Head.  Normocephalic and atraumatic Eyes.  Pupils round and reactive to light no discharge without nystagmus Neck.  Supple nontender no JVD without thyromegaly Cardiac regular rate rhythm without extra sounds or murmur heard Abdomen.  Soft nontender positive bowel sounds without rebound Respiratory effort normal no respiratory distress without wheeze Skin.  No evidence of  rash Extremities.  No clubbing cyanosis or edema Neurologic.  Cranial nerves II through XII intact motor strength 4/5 left deltoid bicep tricep grip hip flexor knee extensor ankle dorsi plantarflexion, trace right deltoid bicep tricep grip hip flexor knee extensor ankle dorsi flexion plantar flexor, patient with somewhat somnolent during exam.  He did display some impaired attention and initiation  He/She  has had improvement in activity tolerance, balance, postural control as well as ability to compensate for deficits. He/She has had improvement in functional use RUE/LUE  and RLE/LLE as well as improvement in awareness.  Bed mobility independent patient able to don shoes with set up sit to stand close supervision to contact-guard throughout sessions.  Ambulatory transfers into the bathroom without assistive device contact-guard ambulates 150 feet during therapy sessions.  Supervision upper body bathing supervision lower body bathing supervision upper body dressing supervision lower body dressing.  Patient able to identify function of objects and 10 out of 10 opportunities with supervision assist.  He is able to name actions and black-and-white pictures of 10 out of 10 opportunities.  Full family teaching completed plan discharged to home       Disposition: Discharged home   Diet: Regular  Special Instructions: No driving smoking or alcohol  Medications at discharge. 1.  Tylenol as needed 2.  Aricept 5 mg p.o. nightly 3.  Melatonin 3 mg p.o. nightly as needed 4.  Provigil 200 mg p.o. daily 5.  Artificial tears as needed   30-35 minutes were spent completing discharge summary and discharge planning  Discharge Instructions     Ambulatory referral to Physical Medicine Rehab   Complete by: As directed    Moderate complexity follow-up 2 weeks right IVH        Follow-up Information     Raulkar, Drema PryKrutika P, MD Follow up.   Specialty: Physical Medicine and Rehabilitation Why: 05/02/21  9:20am Contact information: [address] [place] [address] [phone]         Jadene Pierinistergard, Thomas A, MD Follow up.   Specialty: Neurosurgery Why: Call for appointment Contact information: [address] [place] [address] [phone]         Rema FendtStephens, Amy J, NP Follow up on 05/13/2021.   Specialty: Nurse Practitioner Why: Appointment @ 1:30 PM Contact information: [address] [place] [address] [phone]                 Signed: Mcarthur RossettiDaniel J Macenzie Burford 04/02/2021, 5:17 AM

## 2021-04-01 NOTE — Progress Notes (Signed)
Speech Language Pathology Discharge Summary  Patient Details  Name: Casey Branch MRN: [id number] Date of Birth: Jun 21, 1981  Today's Date: 04/01/2021 SLP Individual Time: 1400-1445 SLP Individual Time Calculation (min): 45 min   Skilled Therapeutic Interventions:  Skilled ST services focused on education and cognitive skills. SLP facilitated assessment of language and cognitive skills with WAB screen and SLUMS. Pt demonstrates accuracy following 2 step commands with 70% accuracy, is able to write full name mod I and address via dictation at letter level. Pt was able to respond to yes/no question simple and complex, name items in room and repeat at sentence level. Pt scored 8 out 30 (n=>27) on cognitive linguistic assessment SLUMS, indicating primary deficit in short term recall impacting basic problem solving, ability to correct errors and sustained attention. SLP communicated with pt's wife via phone to provided education pertaining to current expressive/receptive language skills as well as current cognitive deficits. SLP provided education pertaining to free clinics at [facility] while awaiting insurance for outpatient ST services and next steps to progress language and cognition. All questions answered to satisfaction. Pt was left in room with call bell within reach and bed alarm set. SLP recommends to continue skilled services.    Patient has met 7 of 7 long term goals.  Patient to discharge at overall Supervision;Min level.  Reasons goals not met:     Clinical Impression/Discharge Summary:   Pt made great progress meeting 7 out 7 goals, discharging at min-supervision A. Pt is able to express at the phrase level with min A verbal cues for error awareness and word finding strategies. Pt can match black/white picture of objects in a field of 3 to phrases, pt requires supervision A for reading at phrase level and able to write name mod I as well as letters via dictation in home address. Pt is able to name  object function and divergent naming of 7 animals within a minute timeframe. Pt is also limited by acute changes in recall impact basic problem solving, emergent awareness and sustained attention scoring 8 out 30 (n=>) on SLUMS. Pt benefited from skilled ST services in order to maximize functional independence and reduce burden of care, requiring 24 hour supervision at discharge with continued skilled outpatient ST services.  Care Partner:  Caregiver Able to Provide Assistance: Yes  Type of Caregiver Assistance: Physical;Cognitive  Recommendation:  Outpatient SLP;Home Health SLP;24 hour supervision/assistance  Rationale for SLP Follow Up: Maximize functional communication;Maximize cognitive function and independence;Reduce caregiver burden   Equipment: N/A   Reasons for discharge: Discharged from hospital   Patient/Family Agrees with Progress Made and Goals Achieved: Yes    Lliam Hoh  [facility] 04/01/2021, 3:07 PM

## 2021-04-01 NOTE — Progress Notes (Signed)
Occupational Therapy Session Note  Patient Details  Name: Casey Branch MRN: [id number] Date of Birth: [birth year]  Today's Date: 04/01/2021 OT Individual Time: 0930-1030 OT Individual Time Calculation (min): 60 min    Short Term Goals: Week 2:  OT Short Term Goal 1 (Week 2): STGs = LTGs that have been upgraded to S self care, CGA transfers  Skilled Therapeutic Interventions/Progress Updates:      Pt seen for BADL retraining of toileting, bathing, and dressing with a focus on  R side awareness, memory, sequencing.  Pt received in bed awake. Asked pt if he remembers what I told him yesterday about going home and he had no recall.  Needed mod cues to recall current day and day of discharge. Pt said he was happy to hear it was his last day.   Pt agreeable to shower, he first used bathroom, then brushed teeth at sink in standing, ambulated to closet to retrieve clothing and then to shower all with SUPERVISION.  No physical A needed but he continues to need mod cues with sequencing, immediate recall (ie "I need to put my deoderant"  this was 5 min after he applied it), problem solving and safety awareness to sit when doffing and donning clothing over feet.   His sister arrived as pt was getting out of shower and donning clothing in the bathroom.  Updated her on new discharge date and she was very happy and put his wife on facetime so she could hear too and observe the rest of the session.     Reviewed with them what he was able to do successfully physically but where he also needs significant cuing.    They then observed pt working on picking up wet towels from floor to put in laundry, his dirty clothing to put in his closet, making his bed with putting on fitted sheets, top sheets, 2 pillow cases and blanket.  Pt had no LOB as he ambulated from side to side of bed several times but did need cues with problem solving task especially with the fitted sheet.   His sister and wife again confirmed he  will have 24/7 S and they have a good awareness of his deficits.  We are recommending outpt therapies.      Therapy Documentation Precautions:  Precautions Precautions: Fall Precaution Comments: R inattention,  Impulsive Restrictions Weight Bearing Restrictions: No       Pain: Pain Assessment Pain Scale: 0-10 Pain Score: 0-No pain Faces Pain Scale: No hurt ADL: ADL Eating: Independent Grooming: Supervision/safety Where Assessed-Grooming: Standing at sink Upper Body Bathing: Supervision/safety Where Assessed-Upper Body Bathing: Shower Lower Body Bathing: Supervision/safety Where Assessed-Lower Body Bathing: Shower Upper Body Dressing: Supervision/safety Where Assessed-Upper Body Dressing: Chair Lower Body Dressing: Supervision/safety Where Assessed-Lower Body Dressing: Chair, Edge of bed Toileting: Supervision/safety Where Assessed-Toileting: Teacher, adult education: Close supervision Statistician Method: Event organiser: Close supervision Film/video editor Method: Ambulating   Therapy/Group: Individual Therapy  [name] 04/01/2021, 12:21 PM

## 2021-04-01 NOTE — Progress Notes (Signed)
PROGRESS NOTE   Subjective/Complaints: Plan for d/c Wednesday Sleepy this AM Started Aricept last night No complaints    ROS:  Pt denies SOB, abd pain, CP, N/V/C/D, and vision changes, insomnia    Objective:   No results found. Recent Labs    03/31/21 0844  WBC 5.5  HGB 13.2  HCT 38.8*  PLT 366    Recent Labs    03/31/21 0844  NA 133*  K 4.0  CL 99  CO2 28  GLUCOSE 95  BUN 14  CREATININE 1.02  CALCIUM 8.9     Intake/Output Summary (Last 24 hours) at 04/01/2021 1038 Last data filed at 04/01/2021 0851 Gross per 24 hour  Intake 676 ml  Output --  Net 676 ml        Physical Exam: Vital Signs Blood pressure 116/71, pulse 61, temperature 98.3 F (36.8 C), temperature source Oral, resp. rate 18, height 5\' 10"  (1.778 m), weight 70.8 kg, SpO2 94 %.  Gen: no distress, normal appearing HEENT: oral mucosa pink and moist, NCAT Cardio: Reg rate Chest: normal effort, normal rate of breathing Abd: soft, non-distended Ext: no edema  Psych: flat, difficult to engage Neurologic: Impaired attention. Cranial nerves II through XII intact, motor strength is 5/5 in left and 4/5 RIgh tdeltoid, bicep, tricep, grip, ,  INO bilateral R>L, impaired memory Fairly alert  Musculoskeletal: Full range of motion in all 4 extremities. No joint swelling, no pain with active assisted range of motion. Skin: 1 suture on scalp, bruising, small opening.    Assessment/Plan: 1. Functional deficits which require 3+ hours per day of interdisciplinary therapy in a comprehensive inpatient rehab setting. Physiatrist is providing close team supervision and 24 hour management of active medical problems listed below. Physiatrist and rehab team continue to assess barriers to discharge/monitor patient progress toward functional and medical goals  Care Tool:  Bathing    Body parts bathed by patient: Left arm, Right arm, Chest, Abdomen,  Face, Front perineal area, Buttocks, Right upper leg, Left upper leg, Right lower leg, Left lower leg     Body parts n/a: Front perineal area, Buttocks, Right upper leg, Left upper leg, Right lower leg, Left lower leg   Bathing assist Assist Level: Supervision/Verbal cueing     Upper Body Dressing/Undressing Upper body dressing   What is the patient wearing?: Pull over shirt    Upper body assist Assist Level: Set up assist    Lower Body Dressing/Undressing Lower body dressing      What is the patient wearing?: Pants, Underwear/pull up     Lower body assist Assist for lower body dressing: Supervision/Verbal cueing     Toileting Toileting    Toileting assist Assist for toileting: Supervision/Verbal cueing     Transfers Chair/bed transfer  Transfers assist     Chair/bed transfer assist level: Contact Guard/Touching assist     Locomotion Ambulation   Ambulation assist      Assist level: Contact Guard/Touching assist Assistive device: No Device Max distance: 150'   Walk 10 feet activity   Assist     Assist level: Contact Guard/Touching assist Assistive device: No Device   Walk 50 feet activity  Assist    Assist level: Contact Guard/Touching assist Assistive device: No Device    Walk 150 feet activity   Assist Walk 150 feet activity did not occur: Safety/medical concerns  Assist level: Contact Guard/Touching assist Assistive device: No Device    Walk 10 feet on uneven surface  activity   Assist Walk 10 feet on uneven surfaces activity did not occur: Safety/medical concerns         Wheelchair     Assist Will patient use wheelchair at discharge?: No   Wheelchair activity did not occur: N/A         Wheelchair 50 feet with 2 turns activity    Assist    Wheelchair 50 feet with 2 turns activity did not occur: N/A       Wheelchair 150 feet activity     Assist  Wheelchair 150 feet activity did not occur:  N/A       Blood pressure 116/71, pulse 61, temperature 98.3 F (36.8 C), temperature source Oral, resp. rate 18, height 5\' 10"  (1.778 m), weight 70.8 kg, SpO2 94 %.    Medical Problem List and Plan: 1.  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  Superficial vein thrombosis right upper extremity involving right cephalic vein age indeterminant DVT right axillary vein.  Lower extremity Dopplers negative: Continue Subcutaneous heparin initiated 03/10/2021.  No plan for long-term anticoagulation             -antiplatelet therapy: N/A 3. Pain Management: N/A 4. Insomnia: grounds pass for day/night rhythm  -changed melatonin to prn as wife feels it's making him groggy in the morning. Sleep has improved at night.  5. Neuropsych: This patient is not capable of making decisions on his own behalf. 6. Scalp incision: may remove the remaining suture, apply bacitracin, and dressing. Continue Juven 7. Fluids/Electrolytes/Nutrition: Routine in and outs  8.  Acute hypoxic respiratory failure.  Extubated 03/13/2021 9.  MSSA trach aspirate and group C strep.  7-day course antibiotics completed. 10.  Incidental findings COVID-19.  Airborne precautions completed 03/16/2021 11.  History of tobacco use.  Provide  Counseling 12. Right sided neglect: counseled on sitting on right side of patient to encourage him to look in this direction.  13. Agitation: labs stable 6/28. D/c IV and minimize lab draws. Wife may stay over at night if she would like to help calm patient. Improved 14. Elevated CBGs: HgbA1c. Likely reactive. D/c Ensure and replace with Juven.  15. Constipation: no bm since 6/26----sorbitol 7/2 with good results  7/11- per pt, bowels going well- con't regimen 16. Impaired attention: improving with modafinil- continue, increase to 200mg .  17. Hypokalemia: klor 9/11 once today, repeat K+ next week.   7/11- K+ 4.0- con't to monitor 18. Incontinence:  continue bowel and bladder program 19. Tachycardic: stable 20. Impaired arousal: continue modafinil 200mg , added Aricept 5mg  HS last night Disposition: estimated 21-24 days. D/c 7/26. HFU scheduled 05/02/21 9:20am. Advised wife to call Dr. 8/26 office to schedule f/u. Wife works from home, to take 07/02/21- advised to give forms to SW and we will help with this.     LOS: 13 days A FACE TO FACE EVALUATION WAS PERFORMED  Barb Merino Thoms Barthelemy 04/01/2021, 10:38 AM

## 2021-04-02 ENCOUNTER — Other Ambulatory Visit (HOSPITAL_COMMUNITY): Payer: Self-pay

## 2021-04-02 NOTE — Progress Notes (Signed)
Inpatient Rehabilitation Care Coordinator Discharge Note  The overall goal for the admission was met for:   Discharge location: Yes-HOME WITH FAMILY PROVIDING 24/7 CARE  Length of Stay: Yes-14 DAYS  Discharge activity level: Yes-SUPERVISION-CGA LEVEL  Home/community participation: Yes  Services provided included: MD, RD, PT, OT, SLP, RN, CM, Pharmacy, and SW  Financial Services: Other: NONE  Choices offered to/list presented to:PT AND WIFE  Follow-up services arranged: Outpatient: [facility]PT,OT,SP WILL CALL WIFE TO ARRANGE APPOINTMENTS. ALSO GAVE [place] [facility] [place] INFORMATION . MATCH FOR PRESCRIPTION COVERAGE AND PCP ARRANGED AT PRIMARY CLINIC AT [facility] 8/23 @ 1:30 PM  Comments (or additional information):WIFE AND PT'S SISTER HAS BEEN HERE FOR EDUCATION PT REQUESTED TO [facility] AND WILL HAVE 24/7 CARE AT HOME  Patient/Family verbalized understanding of follow-up arrangements: Yes  Individual responsible for coordination of the follow-up plan: [facility]-WIFE  [phone]  Confirmed correct DME delivered: Elease Hashimoto 04/02/2021    Cesiah Westley, Gardiner Rhyme

## 2021-04-20 NOTE — Progress Notes (Signed)
Subjective:    Casey Branch - 40 y.o. male MRN [id number]  Date of birth: [birth year]  HPI  Casey Branch is to establish care. Patient has a PMH significant for intraventricular hemorrhage. He is accompanied by his wife [name] who serves as part historian.   Visit 03/19/2021 - 04/02/2021 at [facility] per MD note: Discharge Diagnoses:  Principal Problem:   Intraventricular hemorrhage (HCC) Active Problems:   ICH (intracerebral hemorrhage) (HCC) Superficial vein thrombosis right upper extremity involving right cephalic vein and age-indeterminate DVT right axillary vein Insomnia Acute hypoxic respiratory failure MSSA trach aspirate and group C strep Incidental findings COVID-19 History of tobacco abuse Constipation   Brief HPI:   Casey Branch is a 40 y.o. right-handed male with past medical history significant for tobacco use 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  Per chart review lives with spouse independent prior to admission.  Presented 03/08/2021 with altered mental status right side weakness slurred speech nausea vomiting of acute onset with left gaze preference.  There was reported unwitnessed fall.  He was intubated for airway protection.  Admission chemistries unremarkable except potassium 3.4 glucose 138 SARS coronavirus positive.  Cranial CT scan showed large volume intraventricular hemorrhage primarily in the left lateral ventricle but also in the right ventricle and third ventricle.  Increased density and mild increase in thickness in the tentorium bilaterally possibly with layering of blood.  Mild hydrocephalus.  CT angiogram of head and neck no emergent large vessel occlusion or stenosis.  No cerebral aneurysm.  Initially placed on 3% hypertonic saline.  Echocardiogram with ejection fraction of 50 to 55% no wall motion abnormalities.  Patient I went right frontal ventriculostomy 03/08/2021 per Dr. Johnsie Cancel followed by diagnostic cerebral  angiogram per Dr. 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  Patient extubated 03/13/2021.  Venous Doppler studies right upper extremity showed a small acute superficial vein thrombosis involving the right cephalic vein and age-indeterminate DVT right axillary vein.  He was cleared to begin subcutaneous heparin for DVT prophylaxis and would not need long-term anticoagulation on discharge.  Venous Doppler studies of lower extremities negative.  Patient did complete a 7-day course of antibiotics for MSSA trach aspirate and group C strep.  In regards to patient's incidental findings of COVID-19 was treated airborne precautions until 03/16/2021 and discontinued.  Due to patient's agitation and restlessness he was initially placed on low-dose Precedex.  Therapy evaluations completed due to patient decreased functional mobility was admitted for a comprehensive rehab program.     Hospital Course: Casey Branch was admitted to rehab 03/19/2021 for inpatient therapies to consist of PT, ST and OT at least three hours five days a week. Past admission physiatrist, therapy team and rehab RN have worked together to provide customized collaborative inpatient rehab.  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  Status post EVD removed 03/13/2021.  Patient would follow-up neurosurgery.  He did have some slurred speech left gaze preference.  In regards to patient's superficial vein thrombosis right upper extremity involving right cephalic vein and age-indeterminate DVT right axillary vein lower extremity Dopplers negative.  He was maintained on subcutaneous heparin for DVT prophylaxis no plan for long-term anticoagulation at discharge.  Bouts of insomnia working with sleep schedule melatonin had been used as needed.  Acute hypoxic respiratory failure extubated 03/13/2021 oxygen saturations greater than 90%.  He had completed 7-day course antibiotics for  MSSA trach aspirate and group C strep.  Incidental findings COVID-19 Airborne precautions completed 03/16/2021 patient mains afebrile.  He did have a history of tobacco abuse receiving counsel guards to cessation of nicotine products.  Bouts of agitation restlessness initially on Precedex as well as bouts of impaired arousal maintained on Provigil with the addition of Aricept. Blood pressures were monitored on TID basis and controlled   Rehab course: During patient's stay in rehab weekly team conferences were held to monitor patient's progress, set goals and discuss barriers to discharge. At admission, patient required minimal assist stand pivot transfers mod max assist 25 feet rolling walker moderate assist supine to sit max is lower body dressing +2 physical assist with toilet transfers    Medications at discharge. 1.  Tylenol as needed 2.  Aricept 5 mg p.o. nightly 3.  Melatonin 3 mg p.o. nightly as needed 4.  Provigil 200 mg p.o. daily 5.  Artificial tears as needed  Today's visit 04/21/2021 at [facility] at [facility]: Patient reports feeling ok. Concern for vision. Had appointment with eye doctor on last week and was told vision 20/20. Wife reports that was concerning as patient is having trouble focusing on small print words for example. He has a follow-up appointment with eye doctor on this week for vision field testing. Patient reports right eye has decreased vision but difficult to describe.   Speech back to normal. He is bathing, clothing, and feeding himself. Denies residual weakness, chest pain, shortness of breath, nausea, vomiting, tingling, and numbness.   Current medications are Aricept, Provigil, and Melatonin.   Wife is staying home with him 24/7. Reports she works from home. FMLA paperwork for her completed for 12 weeks prior to patient's hospital discharge. Patient has questions of when he will be able to return to work. However, his employer has been  supportive thus far of his leave.   Appointment tomorrow with therapy. Aware of appointments with Neurosurgery and Physical Medicine.    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 His smoking use included cigarettes. He has never been exposed to tobacco smoke. He has never used smokeless tobacco. He reports previous alcohol use. He reports that he does not use drugs.   Family History  family history is not on file.   Medications: reviewed and updated   Objective:   Physical Exam BP 114/78 (BP Location: Left Arm, Patient Position: Sitting, Cuff Size: Large)   Pulse 92   Temp 98.3 F (36.8 C)   Resp 16   Ht 6' 0.05" (1.83 m)   Wt 166 lb 12.8 oz (75.7 kg)   SpO2 96%   BMI 22.59 kg/m   Physical Exam HENT:     Head: Normocephalic and atraumatic.  Eyes:     Extraocular Movements: Extraocular movements intact.     Conjunctiva/sclera: Conjunctivae normal.     Pupils: Pupils are equal, round, and reactive to light.  Cardiovascular:  Rate and Rhythm: Normal rate and regular rhythm.     Pulses: Normal pulses.     Heart sounds: Normal heart sounds.  Pulmonary:     Effort: Pulmonary effort is normal.     Breath sounds: Normal breath sounds.  Musculoskeletal:     Cervical back: Normal range of motion and neck supple.  Skin:    General: Skin is warm and dry.  Neurological:     General: No focal deficit present.     Mental Status: He is alert and oriented to person,  place, and time.  Psychiatric:        Mood and Affect: Mood normal.        Behavior: Behavior normal.      Assessment & Plan:  1. Encounter to establish care: - Patient presents today to establish care.  - Return for annual physical examination, labs, and health maintenance. Arrive fasting meaning having no food for at least 8 hours prior to appointment. You may have only water or black coffee. Please take scheduled medications as normal.  2. Hospital discharge follow-up: - Reviewed hospital course, current medications, ensured proper follow-up in place, and addressed concerns.   3. Intraventricular hemorrhage (HCC): 4. Nontraumatic intracerebral hemorrhage, unspecified cerebral location, unspecified laterality [facility]): - Patient stable in office today without evidence of red flag symptoms. - Continue Donepezil, Modafinil, and Melatonin as prescribed.  - Keep appointment scheduled 04/22/2021 with Benjie Karvonen, DPT at [facility]. - Keep appointment scheduled 04/22/2021 with Kallie Edward, OT at [facility].  - Keep appointment scheduled 04/22/2021 with Verdie Mosher, Speech Language Pathologist at [facility].  - Keep Neurosurgery appointment scheduled 04/23/2021. - Keep appointment scheduled 04/30/2021 with Sula Soda, MD at [facility] Medicine [facility] Re[facility].  - Follow-up with primary provider as scheduled.     Patient was given clear instructions to go to Emergency Department or return to medical center if symptoms don't improve, worsen, or new problems develop.The patient verbalized understanding.  I discussed the assessment and treatment plan with the patient. The patient was provided an opportunity to ask questions and all were answered. The patient agreed with the plan and demonstrated an understanding of the instructions.   The patient was advised to call back or seek an in-person evaluation if the symptoms worsen or if the condition fails to improve as anticipated.    Ricky Stabs, NP 04/21/2021, 4:39 PM Primary Care at [facility]

## 2021-04-21 ENCOUNTER — Encounter: Payer: Self-pay | Admitting: Family

## 2021-04-21 ENCOUNTER — Other Ambulatory Visit: Payer: Self-pay

## 2021-04-21 ENCOUNTER — Ambulatory Visit (INDEPENDENT_AMBULATORY_CARE_PROVIDER_SITE_OTHER): Payer: 59 | Admitting: Family

## 2021-04-21 VITALS — BP 114/78 | HR 92 | Temp 98.3°F | Resp 16 | Ht 72.05 in | Wt 166.8 lb

## 2021-04-21 DIAGNOSIS — I671 Cerebral aneurysm, nonruptured: Secondary | ICD-10-CM | POA: Insufficient documentation

## 2021-04-21 DIAGNOSIS — I619 Nontraumatic intracerebral hemorrhage, unspecified: Secondary | ICD-10-CM | POA: Diagnosis not present

## 2021-04-21 DIAGNOSIS — Z09 Encounter for follow-up examination after completed treatment for conditions other than malignant neoplasm: Secondary | ICD-10-CM | POA: Diagnosis not present

## 2021-04-21 DIAGNOSIS — I615 Nontraumatic intracerebral hemorrhage, intraventricular: Secondary | ICD-10-CM

## 2021-04-21 DIAGNOSIS — Z7689 Persons encountering health services in other specified circumstances: Secondary | ICD-10-CM

## 2021-04-21 NOTE — Patient Instructions (Signed)
Thank you for choosing Primary Care at [facility] for your medical home!    Casey Branch was seen by [name], NP today.   Casey Branch's primary care provider is Christian Treadway Jodi Geralds, NP.   For the best care possible,  you should try to see Casey Stabs, NP whenever you come to clinic.   We look forward to seeing you again soon!  If you have any questions about your visit today,  please call us at [phone]  Or feel free to reach your provider via MyChart.    Hemorrhagic Stroke  A hemorrhagic stroke happens when a blood vessel in the brain leaks or bursts (ruptures). This causes bleeding in or around the brain (hemorrhage) and leads to the sudden death of brain tissue. A hemorrhagic stroke is amedical emergency. It can cause brain damage and death. There are two major types of hemorrhagic stroke: Intracerebral hemorrhage. This happens when bleeding occurs within the brain tissue. Subarachnoid hemorrhage. This happens when bleeding occurs in the area between the brain and the membrane that covers the brain (subarachnoid space). What are the causes? This condition may be caused by: Head injury (trauma). Part of a weakened blood vessel wall bulging or ballooning out (cerebral aneurysm). Thin and hardened blood vessels due to plaque buildup. Tangled blood vessels in the brain (arteriovenous malformation). Protein buildup on the artery walls of the brain (amyloid angiopathy). Inflamed blood vessels (vasculitis). A brain tumor. Sometimes, the cause of this condition is not known. What increases the risk? The following factors may make you more likely to develop this condition: Hypertension. Having abnormal blood vessels present since birth (congenital abnormality). Bleeding disorders, such as hemophilia, sickle cell disease, or liver disease. Blood thinners (anticoagulants) that make the blood too thin. Being an older adult. Moderate or heavy alcohol use. Using drugs, such as  cocaine or methamphetamines. What are the signs or symptoms? Symptoms of this condition include: The sudden onset of: Weakness or numbness of the face, arm, or leg, especially on one side of the body. Confusion. Trouble speaking or understanding speech. Difficulty seeing in one or both eyes. Difficulty walking or moving the arms or legs. Dizziness, or loss of balance or coordination. Nausea and vomiting. A severe headache with no known cause. This headache may feel like the worst one a person has ever had. Seizures. How is this diagnosed? This condition may be diagnosed based on: Your symptoms, medical history, and a physical exam. Tests, including: Blood tests. CT scan. MRI. CT angiography (CTA) or magnetic resonance angiography (MRA). Catheter angiogram. In this procedure, dye is injected through a long, thin tube (catheter) into one of your arteries. X-rays are taken and will show whether a blockage or a problem in a blood vessel exists. How is this treated? The goals of treatment are to stop the bleeding, reduce pressure on the brain, relieve symptoms, and prevent complications. Treatment may include: Medicines that do the following: Lower blood pressure (antihypertensives). Relieve pain (analgesics), fever, nausea, or vomiting. Stop or prevent seizures (anticonvulsants). Prevent blood vessels in the brain from spasming in response to bleeding. Control bleeding in the brain. Use of a machine to help you breathe (ventilator). A blood transfusion to help your blood clot. Placement of a tube (shunt) in the brain to relieve pressure. Physical, speech, or occupational therapy. Surgery to stop the bleeding, remove a blood clot or tumor, or reduce pressure. Treatment depends on the cause, severity, and duration of symptoms. Talk withyour health care provider  about what to expect during your recovery. Follow these instructions at home: Activity Use a walker or a cane as told by your  health care provider. Return to your normal activities as told by your health care provider. Ask your health care provider what activities are safe for you. Rest to help your brain heal. Make sure you: Get plenty of sleep. Avoid staying up late at night. Keep to a sleep schedule. Go to sleep and wake up at about the same time every day. Avoid activities that cause physical or mental stress. Lifestyle Do not drink alcohol if: Your health care provider tells you not to drink. You are pregnant, may be pregnant, or are planning to become pregnant. If you drink alcohol: Limit how much you use to: 0-1 drink a day for women. 0-2 drinks a day for men. Know how much alcohol is in your drink. In the [place] one drink equals one 12 oz bottle of beer (355 mL), one 5 oz glass of wine (148 mL), or one 1 oz glass of hard liquor (44 mL). General instructions Do not use any products that contain nicotine or tobacco. These include cigarettes, chewing tobacco, and vaping devices, such as e-cigarettes. If you need help quitting, ask your health care provider. Do not drive or use heavy machinery until your health care provider approves. Take over-the-counter and prescription medicines only as told by your health care provider. Keep all follow-up visits, including those with therapists. This is important. How is this prevented? You can reduce your risk of stroke by managing conditions, such as: High blood pressure. High cholesterol. Diabetes. Heart disease. Obesity. Other factors and lifestyle changes that can lower your risk include: Quitting smoking, limiting alcohol, and staying physically active. Having your bloodwork monitored frequently if you take anticoagulants (blood thinners). Contact a health care provider if: You develop any of the following symptoms: Headaches that keep coming back (that are chronic). Nausea. Vision problems. Increased sensitivity to noise or light. Depression, mood swings,  anxiety, or irritability. Memory problems. Difficulty concentrating or paying attention. Sleep problems. Feeling tired all of the time. Get help right away if: You have a partial or total loss of consciousness. You are taking blood thinners and you fall or have a minor injury to the head. You have a bleeding disorder and you fall, or you have a minor trauma to the head. You have any symptoms of a stroke. "BE FAST" is an easy way to remember the main warning signs of a stroke: B - Balance. Signs are dizziness, sudden trouble walking, or loss of balance. E - Eyes. Signs are trouble seeing or a sudden change in vision. F - Face. Signs are sudden weakness or numbness of the face, or the face or eyelid drooping on one side. A - Arms. Signs are weakness or numbness in an arm. This happens suddenly and usually on one side of the body. S - Speech. Signs are sudden trouble speaking, slurred speech, or trouble understanding what people say. T - Time. Time to call emergency services. Write down what time symptoms started. You have other signs of a stroke, such as: A sudden, severe headache with no known cause. Nausea or vomiting. Seizure. These symptoms may represent a serious problem that is an emergency. Do not wait to see if the symptoms will go away. Get medical help right away. Call your local emergency services (911 in the [place] Do not drive yourself to the hospital. Summary Hemorrhagic stroke is caused by bleeding  in or around the brain. Hemorrhagic stroke is a medical emergency. Know the signs and symptoms of stroke. You can reduce your risk of stroke by managing conditions, quitting smoking, and making other lifestyle changes. This information is not intended to replace advice given to you by your health care provider. Make sure you discuss any questions you have with your healthcare provider. Document Revised: 06/11/2020 Document Reviewed: 06/11/2020 Elsevier Patient Education  2022  [employer].

## 2021-04-21 NOTE — Progress Notes (Signed)
Pt presents to establish care and hospital follow-up accompanied by wife [place]. Neurosurgery follow-up scheduled for Wednesday 8/3

## 2021-04-22 ENCOUNTER — Ambulatory Visit: Payer: 59 | Admitting: Occupational Therapy

## 2021-04-22 ENCOUNTER — Encounter: Payer: Self-pay | Admitting: Occupational Therapy

## 2021-04-22 ENCOUNTER — Ambulatory Visit: Payer: 59 | Admitting: Physical Therapy

## 2021-04-22 ENCOUNTER — Ambulatory Visit: Payer: 59

## 2021-04-22 DIAGNOSIS — R41842 Visuospatial deficit: Secondary | ICD-10-CM

## 2021-04-22 DIAGNOSIS — I671 Cerebral aneurysm, nonruptured: Secondary | ICD-10-CM | POA: Diagnosis present

## 2021-04-22 DIAGNOSIS — R482 Apraxia: Secondary | ICD-10-CM | POA: Insufficient documentation

## 2021-04-22 DIAGNOSIS — M6281 Muscle weakness (generalized): Secondary | ICD-10-CM

## 2021-04-22 DIAGNOSIS — I69151 Hemiplegia and hemiparesis following nontraumatic intracerebral hemorrhage affecting right dominant side: Secondary | ICD-10-CM | POA: Insufficient documentation

## 2021-04-22 DIAGNOSIS — R41841 Cognitive communication deficit: Secondary | ICD-10-CM | POA: Insufficient documentation

## 2021-04-22 DIAGNOSIS — R4701 Aphasia: Secondary | ICD-10-CM | POA: Insufficient documentation

## 2021-04-22 DIAGNOSIS — I615 Nontraumatic intracerebral hemorrhage, intraventricular: Secondary | ICD-10-CM

## 2021-04-22 DIAGNOSIS — R41840 Attention and concentration deficit: Secondary | ICD-10-CM

## 2021-04-22 DIAGNOSIS — I619 Nontraumatic intracerebral hemorrhage, unspecified: Secondary | ICD-10-CM | POA: Diagnosis present

## 2021-04-22 DIAGNOSIS — R41844 Frontal lobe and executive function deficit: Secondary | ICD-10-CM | POA: Insufficient documentation

## 2021-04-22 NOTE — Therapy (Signed)
[facility] [address] [place], [place], [address] Phone: [phone]   Fax:  [phone]  Occupational Therapy Evaluation  Patient Details  Name: Casey Branch MRN: [id number] Date of Birth: May 04, 1981 Referring Provider (OT): Sula Soda  Encounter Date: 04/22/2021   OT End of Session - 04/23/21 0958     Visit Number 1    Number of Visits 17    Date for OT Re-Evaluation 07/02/21   10 weeks for allowing for scheduling difficulties   Authorization Type Self Pay - awating Medicaid approval. Per patient's spouse, insurance was effective 04/22/21 - will adjust as information is received ([employer])    OT Start Time 1445    OT Stop Time 1530    OT Time Calculation (min) 45 min    Activity Tolerance Patient tolerated treatment well    Behavior During Therapy [facility] for tasks assessed/performed             Past Medical History:  Diagnosis Date   Brain aneurysm     Past Surgical History:  Procedure Laterality Date   IR 3D INDEPENDENT WKST  03/11/2021   IR ANGIO INTRA EXTRACRAN SEL COM CAROTID INNOMINATE BILAT MOD SED  03/11/2021   IR ANGIO VERTEBRAL SEL VERTEBRAL BILAT MOD SED  03/11/2021   RADIOLOGY WITH ANESTHESIA N/A 03/11/2021   Procedure: IR WITH ANESTHESIA - DIAGNOSTIC CEREBRAL ANGIOGRAM;  Surgeon: Lisbeth Renshaw, MD;  Location: [facility] OR;  Service: Radiology;  Laterality: N/A;   VENTRICULOPERITONEAL SHUNT      There were no vitals filed for this visit.   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 Prior to recent rupture, pt was independent with ADLs and IADLs. Pt lives with spouse and 18 year old son. Pt is a right hand dominant with residual RUE weakness. Pt reports some difficulties with STM since rupture and some difficulty with focusing with vision but otherwise reports no difficulties physically. Pt did not  verbalize a goal for OT.    Patient is accompanied by: Family member   spouse, Casey Branch   Pertinent History CVA (brain aneurysm) 2004    Limitations no driving, impulsive    Currently in Pain? No/denies               [facility] OT Assessment - 04/22/21 1447       Assessment   Medical Diagnosis AVM Rupture    Referring Provider (OT) Sula Soda    Onset Date/Surgical Date 03/08/21    Hand Dominance Right    Next MD Visit 04/23/2021    Prior Therapy Inpatient Rehab      Precautions   Precautions None      Balance Screen   Has the patient fallen in the past 6 months No      Home  Environment   Family/patient expects to be discharged to: Private residence    Living Arrangements Spouse/significant other   + 40 year old   Type of Home House    Home Layout One level    Bathroom Copywriter, advertising      Prior Function   Level of Independence Independent    Vocation Full time 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      IADL   Prior Level of Function Shopping independent    Shopping Needs to be accompanied on any shopping trip    Prior Level of Function Light Housekeeping independent    Light Housekeeping Launders small items, rinses stockings, etc.    Prior Level of Function Meal Prep independent prior    Meal Prep Able to complete simple cold meal and snack prep   has boiled hot dogs   Prior Level of Function Community Mobility driving    Community Mobility Relies on family or friends for transportation    Medication Management Has difficulty remembering to take medication    Prior Level of Function Financial Management was not managing prior    Financial Management Dependent      Written Expression   Dominant Hand Right    Handwriting --   like it was prior     Vision - History   Baseline Vision  No visual deficits   prior     Vision Assessment   Ocular Range of Motion Restricted on looking up    Tracking/Visual Pursuits Decreased smoothness of eye movement to Left superior field    Saccades Decreased speed of saccadic movement    Convergence Other (comment)   maybe impaired - continue to assess   Diplopia Assessment --   no diplopia reported   Comment 88 Cancellation 78/80 and 97.5% accuracy      Cognition   Area of Impairment Attention;Memory;Awareness;Safety/judgement    Behaviors Impulsive      Observation/Other Assessments   Focus on Therapeutic Outcomes (FOTO)  N/A      Sensation   Light Touch Appears Intact      Coordination   9 Hole Peg Test Right;Left    Right 9 Hole Peg Test 29.06s    Left 9 Hole Peg Test 29.97s      ROM / Strength   AROM / PROM / Strength AROM;Strength      AROM   Overall AROM  Within functional limits for tasks performed      Strength   Overall Strength Other (comment)    Overall Strength Comments Difficult to assess d/t apraxia and pt reporting "tired" and could not maintain position      Hand Function   Right Hand Gross Grasp Functional    Right Hand Grip (lbs) 81.5    Left Hand Gross Grasp Functional    Left Hand Grip (lbs) 111.5                               OT Short Term Goals - 04/23/21 1423       OT SHORT TERM GOAL #1   Title Pt will be independent with initial HEP    Time 4    Period Weeks    Status New    Target Date 05/21/21      OT SHORT TERM GOAL #2   Title Pt will increase grip strength in RUE to 90 lbs or greater for increasing strength in R, dominant, hand.    Baseline R 81.5, L 111.5    Time 4    Period Weeks    Status New      OT SHORT TERM GOAL #3   Title Continue to assess vision and determine goals PRN    Time 4    Period Weeks    Status New  OT SHORT TERM GOAL #4   Title Pt will perform simple warm meal prep and/or light housekeeping with supervision and good safety  awareness    Time 4    Period Weeks    Status New      OT SHORT TERM GOAL #5   Title Pt will perform environmental scanning with 90% accuracy in mod distracting environment    Baseline 12/14 in low distracting environment (86%)    Time 4    Period Weeks    Status New               OT Long Term Goals - 04/23/21 1437       OT LONG TERM GOAL #1   Title Pt will be independent with updated HEP    Time 10    Period Weeks    Status New    Target Date 07/02/21      OT LONG TERM GOAL #2   Title Pt will increase grip strength by 15 lbs or greater in RUE for increasing functional strength in RUE, dominant, hand.    Baseline R 81.5, L 111.5    Time 10    Period Weeks    Status New      OT LONG TERM GOAL #3   Title Pt will perform physical and cognitive tasks simultaneously with 95% accuracy    Time 10    Period Weeks    Status New      OT LONG TERM GOAL #4   Title Pt will perform mod complex or follow instructions or recipe in cooking task with good safety awareness and with mod I.    Time 10    Period Weeks    Status New      OT LONG TERM GOAL #5   Title Pt and caregiver will verbalize understanding of return to work and return to driving recommendations    Time 10    Period Weeks    Status New                   Plan - 04/23/21 1003     Clinical Impression Statement Pt is a 40 year old male that presents to Neuro OPOT s/p AVM rupture on 03/08/21. 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  Presented 03/08/2021 with altered mental status and right-sided weakness, with slurred speech nausea and vomiting of acute onset with left gaze preference.  There was a reported unwitnessed fall.  Cranial CT scan showed large volume intraventricular hemorrhage primarily in the left lateral ventricle but also in the right ventricle and third ventricle.  Increased density and mild increase in thickness in  the tentorium bilaterally possibly with layering of blood.  Mild hydrocephalus.  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  No cerebral aneurysm.  Patient underwent right frontal ventriculostomy 03/08/2021 per Dr. Johnsie Cancelstergaard followed by diagnostic cerebral angiogram per Dr. 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 Pt presents with RUE weakness, motor apraxia, and visual deficits and cognitive deficits with attention, memory, safety awareness, executive functioning, problem solving and sequencing tasks. Skilled occupational therapy is recommended to target listed deficits and increase independence with IADLs, increase safety and decrease caregiver burden.    OT Occupational Profile and History Problem Focused Assessment - Including review  of records relating to presenting problem    Occupational performance deficits (Please refer to evaluation for details): IADL's;Work;Leisure    Body Structure / Function / Physical Skills Decreased knowledge of use of DME;Strength;GMC;Dexterity;Proprioception;UE functional use;IADL;Vision;Flexibility;FMC;Endurance    Cognitive Skills Problem Solve;Sequencing;Safety Awareness;Attention;Memory    Rehab Potential Good    Clinical Decision Making Limited treatment options, no task modification necessary    Comorbidities Affecting Occupational Performance: None    Modification or Assistance to Complete Evaluation  No modification of tasks or assist necessary to complete eval    OT Frequency 2x / week    OT Duration Other (comment)   16 visits over 10 weeks for scheduling conflicts   OT Treatment/Interventions Self-care/ADL training;DME and/or AE instruction;Therapeutic activities;Energy conservation;Patient/family education;Visual/perceptual remediation/compensation;Therapeutic  exercise;Cognitive remediation/compensation;Neuromuscular education;Functional Mobility Training    Plan look more into visual deficits, cognitive/attention tasks, green theraputty for RUE?    Consulted and Agree with Plan of Care Patient;Family member/caregiver    Family Member Consulted spouse, [name]             Patient will benefit from skilled therapeutic intervention in order to improve the following deficits and impairments:   Body Structure / Function / Physical Skills: Decreased knowledge of use of DME, Strength, GMC, Dexterity, Proprioception, UE functional use, IADL, Vision, Flexibility, [facility], Endurance Cognitive Skills: Problem Solve, Sequencing, Safety Awareness, Attention, Memory     Visit Diagnosis: Muscle weakness (generalized) - Plan: Ot plan of care cert/re-cert  Apraxia - Plan: Ot plan of care cert/re-cert  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) - Plan: Ot plan of care cert/re-cert  Attention and concentration deficit - Plan: Ot plan of care cert/re-cert  Frontal lobe and executive function deficit - Plan: Ot plan of care cert/re-cert  Visuospatial deficit - Plan: Ot plan of care cert/re-cert    Problem List Patient Active Problem List   Diagnosis Date Noted   Brain aneurysm 04/21/2021   ICH (intracerebral hemorrhage) (HCC) 03/19/2021   Intraventricular hemorrhage (HCC) 03/08/2021    Junious Dresser MOT, OTR/L  04/23/2021, 2:48 PM  [facility] [address] [place], [place], [address] Phone: [phone]   Fax:  [phone]  Name: Casey Branch MRN: [id number] Date of Birth: [birth year]

## 2021-04-22 NOTE — Therapy (Signed)
[facility] [address] [place], [place], [address] Phone: [phone]   Fax:  [phone]  Physical Therapy Evaluation  Patient Details  Name: Casey Branch MRN: [id number] Date of Birth: 1981/05/16 Referring Provider (PT): Angiulli   Encounter Date: 04/22/2021   PT End of Session - 04/22/21 1552     Visit Number 1    Number of Visits 1    Authorization Type Medicaid potential    PT Start Time 1400    PT Stop Time 1440    PT Time Calculation (min) 40 min    Activity Tolerance Patient tolerated treatment well    Behavior During Therapy [facility] for tasks assessed/performed             Past Medical History:  Diagnosis Date   Brain aneurysm     Past Surgical History:  Procedure Laterality Date   IR 3D INDEPENDENT WKST  03/11/2021   IR ANGIO INTRA EXTRACRAN SEL COM CAROTID INNOMINATE BILAT MOD SED  03/11/2021   IR ANGIO VERTEBRAL SEL VERTEBRAL BILAT MOD SED  03/11/2021   RADIOLOGY WITH ANESTHESIA N/A 03/11/2021   Procedure: IR WITH ANESTHESIA - DIAGNOSTIC CEREBRAL ANGIOGRAM;  Surgeon: Lisbeth Renshaw, MD;  Location: [facility] OR;  Service: Radiology;  Laterality: N/A;   VENTRICULOPERITONEAL SHUNT      There were no vitals filed for this visit.    Subjective Assessment - 04/22/21 1407     Subjective Pt had stroke March 08, 2021. Pt was in ICU for 11 days and transitioned to [facility]. D/C-ed April 02, 2021. Per wife, since return home pt has continued to improve and is no longer requiring assistance for ADLs. Pt reports issues have been resolving. Wife notes vision can be a little tricky -- has been to eye doctor. Pt will see neurosurgeon tomorrow. Pt has been able to return to yard work with breaks. Has not been driving or cooking yet. Was working as a Production designer, theatre/television/film at a [employer].    Patient is accompained by: Family member   Wife, Casey Branch   Pertinent History Brain aneurysm at 37 with shunt    Limitations Lifting    How  long can you sit comfortably? No    How long can you stand comfortably? No    How long can you walk comfortably? No issues    Patient Stated Goals Getting back to work    Currently in Pain? No/denies                [facility] PT Assessment - 04/22/21 0001       Assessment   Medical Diagnosis AVM rupture    Referring Provider (PT) Angiulli    Onset Date/Surgical Date 03/08/21    Hand Dominance Right    Next MD Visit 04/23/2021    Prior Therapy Inpatient rehab      Precautions   Precautions None      Restrictions   Weight Bearing Restrictions No      Balance Screen   Has the patient fallen in the past 6 months No      Home Environment   Living Environment Private residence    Living Arrangements Spouse/significant other    Available Help at Discharge Family    Type of Home House    Home Layout One level    Home Equipment None      Prior Function   Level of Independence Independent    Vocation Full time employment    Vocation Requirements  Wants to return to work ASAP safely working as Production designer, theatre/television/film of car detailingshop    Leisure Yardwork      Observation/Other Assessments   Focus on Therapeutic Outcomes (FOTO)  n/a      Sensation   Light Touch Appears Intact      Functional Tests   Functional tests Single leg stance;Squat   Heel and toe walking WFL     Squat   Comments Full ROM; however, knee bend over toes      Single Leg Stance   Comments L LE: 14.56 sec; R LE: 7.47 sec      ROM / Strength   AROM / PROM / Strength Strength      Strength   Overall Strength Comments 10 on L LE @ 100#; 10 on R LE @ 100# but decreased eccentric control    Strength Assessment Site Hand;Hip;Knee;Ankle    Right/Left hand Right;Left    Right Hand Grip (lbs) 67.9, 52.2, 61.2    Right Hand 3 Point Pinch 6 lbs    Left Hand Grip (lbs) 74.2, 73.1, 60.6    Left Hand 3 Point Pinch 6 lbs    Right/Left Hip Right;Left    Right Hip Flexion 5/5    Right Hip Extension 3+/5    Right Hip  ABduction 4+/5    Left Hip Flexion 5/5    Left Hip Extension 3+/5    Left Hip ABduction 4+/5      Ambulation/Gait   Ambulation Distance (Feet) 115 Feet    Assistive device None    Gait Pattern Within Functional Limits    Ambulation Surface Level;Indoor                        Objective measurements completed on examination: See above findings.               PT Education - 04/22/21 1551     Education Details Discussed how R LE strength/balance is not yet equal to L LE strength. Discussed exercises to help with this at home for return to full PLOF.    Person(s) Educated Patient;Spouse    Methods Explanation;Demonstration;Verbal cues    Comprehension Verbalized understanding;Returned demonstration;Tactile cues required;Verbal cues required              PT Short Term Goals - 04/22/21 1556       PT SHORT TERM GOAL #1   Title Pt verbalizes understanding of HEP for return to full PLOF                       Plan - 04/22/21 1553     Clinical Impression Statement Mr. Casey Branch is a 40 y/o M presenting to OPPT s/p AVM rupture. Pt with very mild residual R LE weakness leading to decreased balance/stability in single leg stance. Otherwise, pt's balance is grossly within normal and he is ambulating at his baseline level. At this point, pt has no PT needs. Discussed with pt and his wife exercises that can help his R LE to function in the same way as his L LE. Both verbalize understanding. To best return to his regular job function, may most benefit from OT.    Personal Factors and Comorbidities Age;Fitness;Time since onset of injury/illness/exacerbation    Examination-Activity Limitations Carry;Caring for Others    Examination-Participation Restrictions Occupation    Stability/Clinical Decision Making Stable/Uncomplicated    Clinical Decision Making Low    Rehab Potential Good  PT Frequency One time visit    PT Home Exercise Plan Single leg  sit<>stand, single leg balance, single leg bridging    Consulted and Agree with Plan of Care Patient;Family member/caregiver    Family Member Consulted Wife             Patient will benefit from skilled therapeutic intervention in order to improve the following deficits and impairments:  Decreased strength, Decreased balance  Visit Diagnosis: Intraventricular hemorrhage (HCC)  Muscle weakness (generalized)     Problem List Patient Active Problem List   Diagnosis Date Noted   Brain aneurysm 04/21/2021   ICH (intracerebral hemorrhage) (HCC) 03/19/2021   Intraventricular hemorrhage (HCC) 03/08/2021    Casey Branch Casey Branch PT, DPT 04/22/2021, 3:59 PM  [facility] [address] [place], [place], [address] Phone: [phone]   Fax:  [phone]  Name: Casey Branch MRN: [id number] Date of Birth: September 03, 1981

## 2021-04-23 ENCOUNTER — Encounter: Payer: Self-pay | Admitting: Occupational Therapy

## 2021-04-23 DIAGNOSIS — R030 Elevated blood-pressure reading, without diagnosis of hypertension: Secondary | ICD-10-CM | POA: Insufficient documentation

## 2021-04-23 DIAGNOSIS — I999 Unspecified disorder of circulatory system: Secondary | ICD-10-CM | POA: Insufficient documentation

## 2021-04-23 NOTE — Patient Instructions (Signed)
   Think of making a schedule - [place] can assist you - that way you know you are doing something during the day and it gives you some structure. Set aside time to work, time to rest, eat, dress, do therapy activities, etc. You ca even put an item or two from your to-do list on your schedule.  Put your therapy appointments into your phone.

## 2021-04-23 NOTE — Therapy (Signed)
[facility] [address] [place], [place], [address] Phone: [phone]   Fax:  [phone]  Speech Language Pathology Evaluation  Patient Details  Name: Casey Branch MRN: [id number] Date of Birth: 12-04-1980 Referring Provider (SLP): Mariam Dollar, [place]   Encounter Date: 04/22/2021   End of Session - 04/23/21 1057     Visit Number 1    Number of Visits 25    Date for SLP Re-Evaluation 07/21/21    SLP Start Time 1535    SLP Stop Time  1620    SLP Time Calculation (min) 45 min    Activity Tolerance --   last part limited by attention            Past Medical History:  Diagnosis Date   Brain aneurysm     Past Surgical History:  Procedure Laterality Date   IR 3D INDEPENDENT WKST  03/11/2021   IR ANGIO INTRA EXTRACRAN SEL COM CAROTID INNOMINATE BILAT MOD SED  03/11/2021   IR ANGIO VERTEBRAL SEL VERTEBRAL BILAT MOD SED  03/11/2021   RADIOLOGY WITH ANESTHESIA N/A 03/11/2021   Procedure: IR WITH ANESTHESIA - DIAGNOSTIC CEREBRAL ANGIOGRAM;  Surgeon: Lisbeth Renshaw, MD;  Location: [facility] OR;  Service: Radiology;  Laterality: N/A;   VENTRICULOPERITONEAL SHUNT      There were no vitals filed for this visit.   Subjective Assessment - 04/23/21 0826     Subjective "He told me in the car today that he was daydreaming." (wife)    Patient is accompained by: Family member   wife-Sierra               SLP Evaluation [facility] - 04/23/21 0001       SLP Visit Information   SLP Received On 04/22/21    Referring Provider (SLP) Mariam Dollar, PA    Onset Date 03-08-21    Medical Diagnosis lt thalamic hemmorhage      Subjective   Patient/Family Stated Goal Pt would like to improve his memory      General Information   HPI Pt admitted 03-08-21 with hx of aneurysm and VP shunt 2004. Pt fell in bathroom during a syncopal episode and then fell again soon after that. Wife called EMS. 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 Pt was placed with ventriculostomy catheter. Intubated from 03-08-21 to 03-14-21. He participated in ST on acute, and on CIR.    Mobility Status ambulatory - has PT eval today      Prior Functional Status   Cognitive/Linguistic Baseline Baseline deficits    Baseline deficit details wife states pt sister states pt memory has not been WNL since previous aneurysm in 2004.    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 These behaviors indicate pt has deficits in attention.    Awareness Comments Pt named "thinking" as being off but could not elaborate more than that. Possible that pt experiencing some difficulty with verbal expression as well.    Behaviors --   flat affect  Auditory Comprehension   Overall Auditory Comprehension Appears within functional limits for tasks assessed      Verbal Expression   Overall Verbal Expression Impaired    Level of Generative/Spontaneous Verbalization Conversation    Impairments Eye contact    Interfering Components Attention    Effective Techniques Sentence completion;Written cues    Other Verbal Expression Comments In simple conversation pt was able to respond appropriately with verbal responses. With more complex topics pt was unable to accurately or definitiviely respond to SLP questions due to either anomia or processing difficulty.      Motor Speech   Overall Motor Speech Appears within functional limits for tasks assessed                             SLP  Education - 04/23/21 1056     Education Details compensations for memory (calendar, schedule)    Person(s) Educated Patient;Spouse    Methods Explanation;Demonstration;Verbal cues    Comprehension Verbalized understanding;Returned demonstration;Verbal cues required;Need further instruction              SLP Short Term Goals - 04/23/21 1438       SLP SHORT TERM GOAL #1   Title pt will use memory compensations for checking appointment schedule in 3 sessions with min A    Time 4    Period Weeks    Status New    Target Date 05/23/21      SLP SHORT TERM GOAL #2   Title pt will demo knowledge of three non-physical deficits in 3 sessions    Time 4    Period Weeks    Status New    Target Date 05/23/21      SLP SHORT TERM GOAL #3   Title pt will demonstrate sustained attention for 8 minutes to a functional  cognitive linguistic task in 3 sesisons    Time 4    Period Weeks    Status New    Target Date 05/23/21      SLP SHORT TERM GOAL #4   Title pt will successfully use compensations for anomia in 10 minute simple-mod complex conversation in 3 sessions    Time 4    Period Weeks    Status New    Target Date 05/23/21      SLP SHORT TERM GOAL #5   Title complete formal cognitive linguisitic teasting    Time 2    Period Weeks    Status New    Target Date 05/09/21              SLP Long Term Goals - 04/23/21 1446       SLP LONG TERM GOAL #1   Title pt will successfully use compensations for anomia in 10 minute mod-max complex conversation in 3 sessions    Time 8    Period Weeks    Status New    Target Date 06/20/21      SLP LONG TERM GOAL #2   Title pt will demo functional alternating attention between two mod complex linguistic tasks in 3 sessions    Time 12    Period Weeks    Status New    Target Date 07/18/21      SLP LONG TERM GOAL #3   Title pt will use memory compensations to accomplish things in his daily schedule (to-do list, etc) for/between 3  sessions    Time 12    Period  Weeks    Status New    Target Date 07/18/21      SLP LONG TERM GOAL #4   Title pt will successfully use compensations for anomia in 10 minute max complex conversation in 3 sessions    Time 12    Period Weeks    Status New    Target Date 07/18/21              Plan - 04/23/21 1058     Clinical Impression Statement Dong presents today with s/sx of a cognitive linguistic deficit in the areas of attention, awareness, memory, and aphasia. Formal assessment to follow in first 1-2 sessions. He is not tracking his own appointments nor taking his own medicine independently at this time. Additionally wife [place] tells SLP while riding in the car today pt told her he was "daydreaming" which wife states occurs now more often than premorbidly. 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    Speech Therapy Frequency 2x / week    Duration 12 weeks    Treatment/Interventions Environmental controls;Functional tasks;SLP instruction and feedback;Cueing hierarchy;Language facilitation;Cognitive reorganization;Compensatory strategies;Internal/external aids;Patient/family education    Potential to Achieve Goals Good    Consulted and Agree with Plan of Care Patient             Patient will benefit from skilled therapeutic intervention in order to improve the following deficits and impairments:   Cognitive communication deficit    Problem List Patient Active Problem List   Diagnosis Date Noted   Brain aneurysm 04/21/2021   ICH (intracerebral hemorrhage) (HCC) 03/19/2021   Intraventricular hemorrhage (HCC) 03/08/2021    [facility]  ,MS, CCC-SLP  04/23/2021, 4:01 PM  [facility] [address] [place], [place], [address] Phone: [phone]   Fax:   [phone]  Name: Samul Mcinroy MRN: [id number] Date of Birth: 1981/06/21

## 2021-04-28 ENCOUNTER — Encounter: Payer: Self-pay | Admitting: Family

## 2021-04-28 ENCOUNTER — Other Ambulatory Visit (HOSPITAL_COMMUNITY): Payer: Self-pay | Admitting: Physician Assistant

## 2021-04-28 ENCOUNTER — Other Ambulatory Visit (HOSPITAL_COMMUNITY): Payer: Self-pay

## 2021-04-28 ENCOUNTER — Other Ambulatory Visit: Payer: Self-pay

## 2021-04-28 ENCOUNTER — Ambulatory Visit: Payer: 59

## 2021-04-28 DIAGNOSIS — I619 Nontraumatic intracerebral hemorrhage, unspecified: Secondary | ICD-10-CM | POA: Diagnosis not present

## 2021-04-28 DIAGNOSIS — R41841 Cognitive communication deficit: Secondary | ICD-10-CM

## 2021-04-28 NOTE — Patient Instructions (Signed)
  Constant Therapy app - free for 14 days    Memory Compensation Strategies  Use "WARM" strategy. W= write it down A=  associate it R=  repeat it M=  make a mental picture  You can keep a Memory Notebook. Use a 3-ring notebook with sections for the following:  calendar, important names and phone numbers, medications, doctors' names/phone numbers, "to do list"/reminders, and a section to journal what you did each day  Use a calendar to write appointments down.  Write yourself a schedule for the day.  This can be placed on the calendar or in a separate section of the Memory Notebook.  Keeping a regular schedule can help memory.  Use medication organizer with sections for each day or morning/evening pills  You may need help loading it  Keep a basket, or pegboard by the door.   Place items that you need to take out with you in the basket or on the pegboard.  You may also want to include a message board for reminders.  Use sticky notes. Place sticky notes with reminders in a place where the task is performed.  For example:  "turn off the stove" placed by the stove, "lock the door" placed on the door at eye level, "take your medications" on the bathroom mirror or by the place where you normally take your medications  Use alarms/timers.  Use while cooking to remind yourself to check on food or as a reminder to take your medicine, or as a reminder to make a call, or as a reminder to perform another task, etc.  Use a voice recorder app or small tape recorder to record important information and notes for yourself. Go back at the end of the day and listen to these.

## 2021-04-28 NOTE — Therapy (Signed)
[facility] [address] [place], [place], [address] Phone: [phone]   Fax:  [phone]  Speech Language Pathology Treatment  Patient Details  Name: Casey Branch MRN: [id number] Date of Birth: [birth year] Referring Provider (SLP): Mariam Dollar, [place]   Encounter Date: 04/28/2021   End of Session - 04/28/21 1113     Visit Number 2    Number of Visits 25    Date for SLP Re-Evaluation 07/21/21    Authorization Type 30 ST    SLP Start Time 1101    SLP Stop Time  1145    SLP Time Calculation (min) 44 min    Activity Tolerance Patient tolerated treatment well             Past Medical History:  Diagnosis Date   Brain aneurysm     Past Surgical History:  Procedure Laterality Date   IR 3D INDEPENDENT WKST  03/11/2021   IR ANGIO INTRA EXTRACRAN SEL COM CAROTID INNOMINATE BILAT MOD SED  03/11/2021   IR ANGIO VERTEBRAL SEL VERTEBRAL BILAT MOD SED  03/11/2021   RADIOLOGY WITH ANESTHESIA N/A 03/11/2021   Procedure: IR WITH ANESTHESIA - DIAGNOSTIC CEREBRAL ANGIOGRAM;  Surgeon: Lisbeth Renshaw, MD;  Location: [facility] OR;  Service: Radiology;  Laterality: N/A;   VENTRICULOPERITONEAL SHUNT      There were no vitals filed for this visit.   Subjective Assessment - 04/28/21 1101     Subjective "I had a stroke"    Patient is accompained by: Family member   wife, [place]   Currently in Pain? No/denies                   ADULT SLP TREATMENT - 04/28/21 1101       General Information   Behavior/Cognition Alert;Cooperative;Pleasant mood      Treatment Provided   Treatment provided Cognitive-Linquistic      Cognitive-Linquistic Treatment   Treatment focused on Cognition;Patient/family/caregiver education    Skilled Treatment Pt reportedly not managing appointments, bills, or medications at this time and is reliant on wife to cue him (baseline following first aneursym in 2004). SLP administered CLQT this session,  which revealed severe executive function and visuospatial deficits, moderate attention and memory deficits, and mild language deficits.  Pt exhibited reduced error awareness on symbol cancellation tasks and clock drawing. Pt noted with usual repetition of named items on generative naming task. Reduced recall exhibited for story retell and design generation. Inaccurate completion of symbol trails (did not alternate shapes) and design generation (only used 3 lines) demonstrated. Pt exhibited reduced comprehension and attention to auditory instructions. Pt stated "I did terrible" but unable to ID any specifics of difficulty. SLP discussed results of CLQT, with pt able to ID errors with second presentation and occasional min verbal and visual cues.      Assessment / Recommendations / Plan   Plan Continue with current plan of care      Progression Toward Goals   Progression toward goals Progressing toward goals              SLP Education - 04/28/21 1117     Education Details CLQT results, external memory aids    Person(s) Educated Patient;Spouse    Methods Explanation;Demonstration    Comprehension Verbalized understanding;Returned demonstration;Need further instruction              SLP Short Term Goals - 04/28/21 1112       SLP SHORT TERM GOAL #1   Title  pt will use memory compensations for checking appointment schedule in 3 sessions with min A    Time 4    Period Weeks    Status On-going    Target Date 05/23/21      SLP SHORT TERM GOAL #2   Title pt will demo knowledge of three non-physical deficits in 3 sessions    Time 4    Period Weeks    Status On-going    Target Date 05/23/21      SLP SHORT TERM GOAL #3   Title pt will demonstrate sustained attention for 8 minutes to a functional  cognitive linguistic task in 3 sesisons    Time 4    Period Weeks    Status On-going    Target Date 05/23/21      SLP SHORT TERM GOAL #4   Title pt will successfully use compensations  for anomia in 10 minute simple-mod complex conversation in 3 sessions    Time 4    Period Weeks    Status On-going    Target Date 05/23/21      SLP SHORT TERM GOAL #5   Title complete formal cognitive linguisitic teasting    Time --    Period --    Status Achieved    Target Date 05/09/21              SLP Long Term Goals - 04/28/21 1113       SLP LONG TERM GOAL #1   Title pt will successfully use compensations for anomia in 10 minute mod-max complex conversation in 3 sessions    Time 8    Period Weeks    Status On-going    Target Date 06/20/21      SLP LONG TERM GOAL #2   Title pt will demo functional alternating attention between two mod complex linguistic tasks in 3 sessions    Time 12    Period Weeks    Status On-going    Target Date 07/18/21      SLP LONG TERM GOAL #3   Title pt will use memory compensations to accomplish things in his daily schedule (to-do list, etc) for/between 3 sessions    Time 12    Period Weeks    Status On-going    Target Date 07/18/21      SLP LONG TERM GOAL #4   Title pt will successfully use compensations for anomia in 10 minute max complex conversation in 3 sessions    Time 12    Period Weeks    Status On-going    Target Date 07/18/21              Plan - 04/28/21 1342     Clinical Impression Statement Son presents today with s/sx of a cognitive linguistic deficit in the areas of attention, awareness, memory, and aphasia. CLQT adminstered today, which revealed severe executive function and visuospatial deficits, moderate attention and memory deficits, and mild language deficits. He is not tracking his own appointments nor taking his own medicine independently at this time; however, pt expressed desire to become more independent. 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    Speech  Therapy Frequency 2x / week    Duration 12 weeks    Treatment/Interventions Environmental controls;Functional tasks;SLP instruction and feedback;Cueing hierarchy;Language facilitation;Cognitive reorganization;Compensatory strategies;Internal/external aids;Patient/family education    Potential to Achieve  Goals Good    SLP Home Exercise Plan provided    Consulted and Agree with Plan of Care Patient             Patient will benefit from skilled therapeutic intervention in order to improve the following deficits and impairments:   Cognitive communication deficit    Problem List Patient Active Problem List   Diagnosis Date Noted   Brain aneurysm 04/21/2021   ICH (intracerebral hemorrhage) (HCC) 03/19/2021   Intraventricular hemorrhage (HCC) 03/08/2021    Janann Colonel, MA CCC-SLP 04/28/2021, 1:45 PM   [facility] [address] [place], [place], [address] Phone: [phone]   Fax:  [phone]   Name: Willie Plain MRN: [id number] Date of Birth: 10/20/1980

## 2021-04-29 ENCOUNTER — Other Ambulatory Visit: Payer: Self-pay

## 2021-04-29 DIAGNOSIS — I671 Cerebral aneurysm, nonruptured: Secondary | ICD-10-CM

## 2021-04-29 MED ORDER — MODAFINIL 200 MG PO TABS: 200.0000 mg | ORAL_TABLET | Freq: Every day | ORAL | 1 refills | Status: DC

## 2021-04-30 ENCOUNTER — Encounter: Payer: Self-pay | Admitting: Physical Medicine and Rehabilitation

## 2021-04-30 ENCOUNTER — Encounter: Payer: Self-pay | Admitting: Occupational Therapy

## 2021-04-30 ENCOUNTER — Ambulatory Visit: Payer: 59 | Admitting: Occupational Therapy

## 2021-04-30 ENCOUNTER — Other Ambulatory Visit: Payer: Self-pay

## 2021-04-30 ENCOUNTER — Encounter: Payer: 59 | Attending: Physical Medicine and Rehabilitation | Admitting: Physical Medicine and Rehabilitation

## 2021-04-30 VITALS — BP 121/86 | HR 75 | Temp 98.4°F | Ht 72.5 in | Wt 168.0 lb

## 2021-04-30 DIAGNOSIS — I671 Cerebral aneurysm, nonruptured: Secondary | ICD-10-CM | POA: Insufficient documentation

## 2021-04-30 DIAGNOSIS — I619 Nontraumatic intracerebral hemorrhage, unspecified: Secondary | ICD-10-CM | POA: Diagnosis not present

## 2021-04-30 DIAGNOSIS — I69151 Hemiplegia and hemiparesis following nontraumatic intracerebral hemorrhage affecting right dominant side: Secondary | ICD-10-CM

## 2021-04-30 DIAGNOSIS — M6281 Muscle weakness (generalized): Secondary | ICD-10-CM

## 2021-04-30 DIAGNOSIS — R41842 Visuospatial deficit: Secondary | ICD-10-CM

## 2021-04-30 DIAGNOSIS — R41840 Attention and concentration deficit: Secondary | ICD-10-CM

## 2021-04-30 DIAGNOSIS — R41844 Frontal lobe and executive function deficit: Secondary | ICD-10-CM

## 2021-04-30 DIAGNOSIS — R482 Apraxia: Secondary | ICD-10-CM

## 2021-04-30 NOTE — Therapy (Signed)
[facility] [address] [place], [place], [address] Phone: [phone]   Fax:  [phone]  Occupational Therapy Treatment  Patient Details  Name: Casey Branch MRN: [id number] Date of Birth: 08/27/1981 Referring Provider (OT): Sula Soda   Encounter Date: 04/30/2021   OT End of Session - 04/30/21 1233     Visit Number 2    Number of Visits 17    Date for OT Re-Evaluation 07/02/21   10 weeks for allowing for scheduling difficulties   Authorization Type Self Pay - awating Medicaid approval. Per patient's spouse, insurance was effective 04/22/21 - will adjust as information is received ([facility])    OT Start Time 1232    OT Stop Time 1315    OT Time Calculation (min) 43 min    Activity Tolerance Patient tolerated treatment well    Behavior During Therapy [facility] for tasks assessed/performed             Past Medical History:  Diagnosis Date   Brain aneurysm     Past Surgical History:  Procedure Laterality Date   IR 3D INDEPENDENT WKST  03/11/2021   IR ANGIO INTRA EXTRACRAN SEL COM CAROTID INNOMINATE BILAT MOD SED  03/11/2021   IR ANGIO VERTEBRAL SEL VERTEBRAL BILAT MOD SED  03/11/2021   RADIOLOGY WITH ANESTHESIA N/A 03/11/2021   Procedure: IR WITH ANESTHESIA - DIAGNOSTIC CEREBRAL ANGIOGRAM;  Surgeon: Lisbeth Renshaw, MD;  Location: [facility] OR;  Service: Radiology;  Laterality: N/A;   VENTRICULOPERITONEAL SHUNT      There were no vitals filed for this visit.   Subjective Assessment - 04/30/21 1233     Subjective  "pretty good"    Patient is accompanied by: Family member   spouse, [name]   Pertinent History CVA (brain aneurysm) 2004    Limitations no driving, impulsive    Currently in Pain? No/denies                          OT Treatments/Exercises (OP) - 04/30/21 1236       ADLs   Cooking "I've cooked some oodles and noodles and hamburger helper"      Cognitive Exercises   Other  Cognitive Exercises 1 Constant Therapy Read a Map level 1 - 90% accuracy with 106.97s response time. pt req'd mod assistance for working memory and cues for recall on making picture bigger and procedure.      Visual/Perceptual Exercises   Copy this Image PVC    PVC separated pieces with no difficulty - replicated pattern/figure (3) with mod assistance for getting started but was able to construct figure without assistance moving forward. Pt required increased time for task and looked at key for each piece    Scanning Tabletop    Scanning - Tabletop noted difficulty with scanning all the way to the right with reading map activity. Pt req'd increased time and no cues for locating items on right                    OT Education - 04/30/21 1239     Education Details issued green theraputty    Person(s) Educated Patient;Spouse    Methods Explanation;Demonstration    Comprehension Verbalized understanding;Returned demonstration              OT Short Term Goals - 04/30/21 1247       OT SHORT TERM GOAL #1   Title Pt will be independent with initial  HEP    Time 4    Period Weeks    Status New    Target Date 05/21/21      OT SHORT TERM GOAL #2   Title Pt will increase grip strength in RUE to 90 lbs or greater for increasing strength in R, dominant, hand.    Baseline R 81.5, L 111.5    Time 4    Period Weeks    Status New      OT SHORT TERM GOAL #3   Title Continue to assess vision and determine goals PRN    Time 4    Period Weeks    Status New      OT SHORT TERM GOAL #4   Title Pt will perform simple warm meal prep and/or light housekeeping with supervision and good safety awareness    Time 4    Period Weeks    Status New      OT SHORT TERM GOAL #5   Title Pt will perform environmental scanning with 90% accuracy in mod distracting environment    Baseline 12/14 in low distracting environment (86%)    Time 4    Period Weeks    Status New               OT  Long Term Goals - 04/23/21 1437       OT LONG TERM GOAL #1   Title Pt will be independent with updated HEP    Time 10    Period Weeks    Status New    Target Date 07/02/21      OT LONG TERM GOAL #2   Title Pt will increase grip strength by 15 lbs or greater in RUE for increasing functional strength in RUE, dominant, hand.    Baseline R 81.5, L 111.5    Time 10    Period Weeks    Status New      OT LONG TERM GOAL #3   Title Pt will perform physical and cognitive tasks simultaneously with 95% accuracy    Time 10    Period Weeks    Status New      OT LONG TERM GOAL #4   Title Pt will perform mod complex or follow instructions or recipe in cooking task with good safety awareness and with mod I.    Time 10    Period Weeks    Status New      OT LONG TERM GOAL #5   Title Pt and caregiver will verbalize understanding of return to work and return to driving recommendations    Time 10    Period Weeks    Status New                   Plan - 04/30/21 1240     Clinical Impression Statement Pt and spouse verbalized understanding and agreement to goals. Noting right inattention and diff scanning entire map with reading map activity.    OT Occupational Profile and History Problem Focused Assessment - Including review of records relating to presenting problem    Occupational performance deficits (Please refer to evaluation for details): IADL's;Work;Leisure    Body Structure / Function / Physical Skills Decreased knowledge of use of DME;Strength;GMC;Dexterity;Proprioception;UE functional use;IADL;Vision;Flexibility;FMC;Endurance    Cognitive Skills Problem Solve;Sequencing;Safety Awareness;Attention;Memory    Rehab Potential Good    Clinical Decision Making Limited treatment options, no task modification necessary    Comorbidities Affecting Occupational Performance: None    Modification or Assistance  to Complete Evaluation  No modification of tasks or assist necessary to complete  eval    OT Frequency 2x / week    OT Duration Other (comment)   16 visits over 10 weeks for scheduling conflicts   OT Treatment/Interventions Self-care/ADL training;DME and/or AE instruction;Therapeutic activities;Energy conservation;Patient/family education;Visual/perceptual remediation/compensation;Therapeutic exercise;Cognitive remediation/compensation;Neuromuscular education;Functional Mobility Training    Plan see how putty is going, cooking - egg, grilled cheese    Consulted and Agree with Plan of Care Patient;Family member/caregiver    Family Member Consulted spouse, Water quality scientist             Patient will benefit from skilled therapeutic intervention in order to improve the following deficits and impairments:   Body Structure / Function / Physical Skills: Decreased knowledge of use of DME, Strength, GMC, Dexterity, Proprioception, UE functional use, IADL, Vision, Flexibility, FMC, Endurance Cognitive Skills: Problem Solve, Sequencing, Safety Awareness, Attention, Memory     Visit Diagnosis: Muscle weakness (generalized)  Apraxia  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)  Attention and concentration deficit  Frontal lobe and executive function deficit  Visuospatial deficit    Problem List Patient Active Problem List   Diagnosis Date Noted   Brain aneurysm 04/21/2021   ICH (intracerebral hemorrhage) (HCC) 03/19/2021   Intraventricular hemorrhage (HCC) 03/08/2021    Junious Dresser MOT, OTR/L  04/30/2021, 2:01 PM  [facility] [address] [place], [place], [address] Phone: [phone]   Fax:  [phone]  Name: Casey Branch MRN: [id number] Date of Birth: 02-10-1981

## 2021-04-30 NOTE — Patient Instructions (Signed)
Insomnia: -Try to go outside near sunrise -Get exercise during the day.  -Discussed good sleep hygiene: turning off all devices an hour before bedtime.  -Chamomile tea with dinner.  -Can consider over the counter melatonin -try tart cherry juice at night

## 2021-04-30 NOTE — Telephone Encounter (Signed)
Spoke w/wife [place] advised her that a PA was completed by previous provider but the insurance will not pay for medication it will have to be out of pocket expense.

## 2021-04-30 NOTE — Progress Notes (Signed)
Subjective:    Patient ID: Casey Branch, male    DOB: [birth year], 40 y.o.   MRN: [id number]  HPI Casey Branch is a 40 year old man who presents for follow-up after ICH.  -He has been doing well at home!  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 20/20 as per optometry   Pain Inventory Average Pain 0 Pain Right Now 0 My pain is  no pain  LOCATION OF PAIN  no pain   BOWEL Number of stools per week: 7  BLADDER Normal   Mobility walk without assistance how many minutes can you walk? unlimited ability to climb steps?  yes do you drive?  no  Function disabled: date disabled . Do you have any goals in this area?  yes  Neuro/Psych No problems in this area  Prior Studies Hospital follow up  Physicians involved in your care Hospital follow up   No family history on file. Social History   Socioeconomic History   Marital status: Married    Spouse name: Not on file   Number of children: Not on file   Years of education: Not on file   Highest education level: Not on file  Occupational History   Not on file  Tobacco Use   Smoking status: Former    Types: Cigarettes    Passive exposure: Never   Smokeless tobacco: Never  Vaping Use   Vaping Use: Never used  Substance and Sexual Activity   Alcohol use: Not Currently    Comment: occasioanl   Drug use: No   Sexual activity: Not on file  Other Topics Concern   Not on file  Social History Narrative   Not on file   Social Determinants of Health   Financial Resource Strain: Not on file  Food Insecurity: Not  on file  Transportation Needs: Not on file  Physical Activity: Not on file  Stress: Not on file  Social Connections: Not on file   Past Surgical History:  Procedure Laterality Date   IR 3D INDEPENDENT WKST  03/11/2021   IR ANGIO INTRA EXTRACRAN SEL COM CAROTID INNOMINATE BILAT MOD SED  03/11/2021   IR ANGIO VERTEBRAL SEL VERTEBRAL BILAT MOD SED  03/11/2021   RADIOLOGY WITH ANESTHESIA N/A 03/11/2021   Procedure: IR WITH ANESTHESIA - DIAGNOSTIC CEREBRAL ANGIOGRAM;  Surgeon: Lisbeth Renshaw, MD;  Location: [facility] OR;  Service: Radiology;  Laterality: N/A;   VENTRICULOPERITONEAL SHUNT     Past Medical History:  Diagnosis Date   Brain aneurysm    BP 121/86   Pulse 75   Temp 98.4 F (36.9 C)   Ht 6' 0.5" (1.842 m)   Wt 168 lb (76.2 kg)   SpO2 97%   BMI 22.47 kg/m   Opioid Risk Score:   Fall Risk Score:  `1  Depression screen PHQ 2/9  Depression screen [facility] 2/9 04/30/2021 04/21/2021  Decreased Interest 0 0  Down, Depressed, Hopeless 0 0  PHQ - 2 Score 0 0  Altered sleeping 0 1  Tired, decreased energy 0 2  Change  in appetite 0 0  Feeling bad or failure about yourself  0 0  Trouble concentrating 0 0  Moving slowly or fidgety/restless 0 0  Suicidal thoughts 0 0  PHQ-9 Score 0 3  Difficult doing work/chores - Not difficult at all    Review of Systems     Objective:   Physical Exam Gen: no distress, normal appearing HEENT: oral mucosa pink and moist, NCAT Cardio: Reg rate Chest: normal effort, normal rate of breathing Abd: soft, non-distended Ext: no edema Psych: pleasant, flat affect Skin: intact Neuro/MSK: excellent ambulation and strength. Improved ability to trach with EOMI. No more nystagmus. Wife provides most medical history     Assessment & Plan:  1) ICH -continue outpatient OT and SP RETURN TO DRIVING PLAN:  WITH THE SUPERVISION OF A LICENSED DRIVER, PLEASE DRIVE IN AN EMPTY PARKING LOT FOR AT LEAST 2-3 TRIALS TO TEST REACTION TIME, VISION, USE OF EQUIPMENT  IN CAR, ETC.  IF SUCCESSFUL WITH THE PARKING LOT DRIVING, PROCEED TO SUPERVISED DRIVING TRIALS IN YOUR NEIGHBORHOOD STREETS AT LOW TRAFFIC TIMES TO TEST OBSERVATION TO TRAFFIC SIGNALS, REACTION TIME, ETC. PLEASE ATTEMPT AT LEAST 2-3 TRIALS IN YOUR NEIGHBORHOOD.  IF NEIGHBORHOOD DRIVING IS SUCCESSFUL, YOU MAY PROCEED TO DRIVING IN BUSIER AREAS IN YOUR COMMUNITY WITH SUPERVISION OF A LICENSED DRIVER. PLEASE ATTEMPT AT LEAST 4-5 TRIALS.  IF COMMUNITY DRIVING IS SUCCESSFUL, YOU MAY PROCEED TO DRIVING ALONE, DURING THE DAY TIME, IN NON-PEAK TRAFFIC TIMES.   -Once successfully returned to driving independently, may return to work  2) Insomnia: -Try to go outside near sunrise -Get exercise during the day.  -Discussed good sleep hygiene: turning off all devices an hour before bedtime.   -Can consider over the counter melatonin -check magnesium and vitamin D levels during labwork with PCP -can try valerian root or chamomile tea -can try tart cherry juice  3) Impaired attention -continue modafinil -recommended blueberries, salmon, green tea with ginko baloba and ginseng from the [place]

## 2021-05-02 ENCOUNTER — Inpatient Hospital Stay: Payer: Self-pay | Admitting: Physical Medicine and Rehabilitation

## 2021-05-05 ENCOUNTER — Encounter: Payer: Self-pay | Admitting: Occupational Therapy

## 2021-05-05 ENCOUNTER — Other Ambulatory Visit: Payer: Self-pay

## 2021-05-05 ENCOUNTER — Ambulatory Visit: Payer: 59 | Admitting: Occupational Therapy

## 2021-05-05 DIAGNOSIS — R41844 Frontal lobe and executive function deficit: Secondary | ICD-10-CM

## 2021-05-05 DIAGNOSIS — M6281 Muscle weakness (generalized): Secondary | ICD-10-CM

## 2021-05-05 DIAGNOSIS — I619 Nontraumatic intracerebral hemorrhage, unspecified: Secondary | ICD-10-CM | POA: Diagnosis not present

## 2021-05-05 DIAGNOSIS — R41842 Visuospatial deficit: Secondary | ICD-10-CM

## 2021-05-05 DIAGNOSIS — I69151 Hemiplegia and hemiparesis following nontraumatic intracerebral hemorrhage affecting right dominant side: Secondary | ICD-10-CM

## 2021-05-05 DIAGNOSIS — R482 Apraxia: Secondary | ICD-10-CM

## 2021-05-05 DIAGNOSIS — R41840 Attention and concentration deficit: Secondary | ICD-10-CM

## 2021-05-05 NOTE — Therapy (Signed)
[facility] [address] [place], [place], [address] Phone: [phone]   Fax:  [phone]  Occupational Therapy Treatment  Patient Details  Name: Casey Branch MRN: [id number] Date of Birth: [birth year] Referring Provider (OT): Sula Soda   Encounter Date: 05/05/2021   OT End of Session - 05/05/21 1236     Visit Number 3    Number of Visits 17    Date for OT Re-Evaluation 07/02/21   10 weeks for allowing for scheduling difficulties   Authorization Type Self Pay - awating Medicaid approval. Per patient's spouse, insurance was effective 04/22/21 - will adjust as information is received ([facility])    OT Start Time 1235    OT Stop Time 1315    OT Time Calculation (min) 40 min    Activity Tolerance Patient tolerated treatment well    Behavior During Therapy [facility] for tasks assessed/performed             Past Medical History:  Diagnosis Date   Brain aneurysm     Past Surgical History:  Procedure Laterality Date   IR 3D INDEPENDENT WKST  03/11/2021   IR ANGIO INTRA EXTRACRAN SEL COM CAROTID INNOMINATE BILAT MOD SED  03/11/2021   IR ANGIO VERTEBRAL SEL VERTEBRAL BILAT MOD SED  03/11/2021   RADIOLOGY WITH ANESTHESIA N/A 03/11/2021   Procedure: IR WITH ANESTHESIA - DIAGNOSTIC CEREBRAL ANGIOGRAM;  Surgeon: Lisbeth Renshaw, MD;  Location: [facility] OR;  Service: Radiology;  Laterality: N/A;   VENTRICULOPERITONEAL SHUNT      There were no vitals filed for this visit.   Subjective Assessment - 05/05/21 1236     Subjective  "everything is getting better"    Patient is accompanied by: Family member   spouse, [name]   Pertinent History CVA (brain aneurysm) 2004    Limitations no driving, impulsive    Currently in Pain? 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 Pt with increased difficulty with alternating attention      Hand Exercises   Other Hand Exercises level 4 with black spring for picking up 1 inch blocks with RUE                      OT Short Term Goals - 05/05/21 1243       OT SHORT TERM GOAL #1   Title Pt will be independent with initial HEP    Time 4    Period Weeks    Status On-going    Target Date 05/21/21      OT SHORT TERM GOAL #2   Title Pt will increase grip strength in RUE to 90 lbs or greater for increasing strength in R, dominant, hand.    Baseline R 81.5, L 111.5    Time 4    Period Weeks    Status On-going      OT SHORT TERM GOAL #3   Title Continue to assess vision and determine goals PRN    Time 4  Period Weeks    Status New      OT SHORT TERM GOAL #4   Title Pt will perform simple warm meal prep and/or light housekeeping with supervision and good safety awareness    Time 4    Period Weeks    Status New      OT SHORT TERM GOAL #5   Title Pt will perform environmental scanning with 90% accuracy in mod distracting environment    Baseline 12/14 in low distracting environment (86%)    Time 4    Period Weeks    Status New               OT Long Term Goals - 04/23/21 1437       OT LONG TERM GOAL #1   Title Pt will be independent with updated HEP    Time 10    Period Weeks    Status New    Target Date 07/02/21      OT LONG TERM GOAL #2   Title Pt will increase grip strength by 15 lbs or greater in RUE for increasing functional strength in RUE, dominant, hand.    Baseline R 81.5, L 111.5    Time 10    Period Weeks    Status New      OT LONG TERM GOAL #3   Title Pt will perform physical and cognitive tasks simultaneously with 95% accuracy    Time 10     Period Weeks    Status New      OT LONG TERM GOAL #4   Title Pt will perform mod complex or follow instructions or recipe in cooking task with good safety awareness and with mod I.    Time 10    Period Weeks    Status New      OT LONG TERM GOAL #5   Title Pt and caregiver will verbalize understanding of return to work and return to driving recommendations    Time 10    Period Weeks    Status New                   Plan - 05/05/21 1244     Clinical Impression Statement Pt demonstrated diff with alternating attention today. Continue progressing towards goals.    OT Occupational Profile and History Problem Focused Assessment - Including review of records relating to presenting problem    Occupational performance deficits (Please refer to evaluation for details): IADL's;Work;Leisure    Body Structure / Function / Physical Skills Decreased knowledge of use of DME;Strength;GMC;Dexterity;Proprioception;UE functional use;IADL;Vision;Flexibility;FMC;Endurance    Cognitive Skills Problem Solve;Sequencing;Safety Awareness;Attention;Memory    Rehab Potential Good    Clinical Decision Making Limited treatment options, no task modification necessary    Comorbidities Affecting Occupational Performance: None    Modification or Assistance to Complete Evaluation  No modification of tasks or assist necessary to complete eval    OT Frequency 2x / week    OT Duration Other (comment)   16 visits over 10 weeks for scheduling conflicts   OT Treatment/Interventions Self-care/ADL training;DME and/or AE instruction;Therapeutic activities;Energy conservation;Patient/family education;Visual/perceptual remediation/compensation;Therapeutic exercise;Cognitive remediation/compensation;Neuromuscular education;Functional Mobility Training    Plan see how putty is going, cooking - egg, grilled cheese    Consulted and Agree with Plan of Care Patient;Family member/caregiver    Family Member Consulted spouse,  [name]             Patient will benefit from skilled therapeutic intervention in order to improve the  following deficits and impairments:   Body Structure / Function / Physical Skills: Decreased knowledge of use of DME, Strength, GMC, Dexterity, Proprioception, UE functional use, IADL, Vision, Flexibility, FMC, Endurance Cognitive Skills: Problem Solve, Sequencing, Safety Awareness, Attention, Memory     Visit Diagnosis: Muscle weakness (generalized)  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 [place], [place], [address] Phone: [phone]   Fax:  [phone]  Name: Casey Branch MRN: [id number] Date of Birth: 1981-08-05

## 2021-05-08 ENCOUNTER — Ambulatory Visit: Payer: 59

## 2021-05-08 ENCOUNTER — Ambulatory Visit: Payer: 59 | Admitting: Occupational Therapy

## 2021-05-08 ENCOUNTER — Other Ambulatory Visit: Payer: Self-pay

## 2021-05-08 DIAGNOSIS — I619 Nontraumatic intracerebral hemorrhage, unspecified: Secondary | ICD-10-CM | POA: Diagnosis not present

## 2021-05-08 DIAGNOSIS — R41840 Attention and concentration deficit: Secondary | ICD-10-CM

## 2021-05-08 DIAGNOSIS — I69151 Hemiplegia and hemiparesis following nontraumatic intracerebral hemorrhage affecting right dominant side: Secondary | ICD-10-CM

## 2021-05-08 DIAGNOSIS — R4701 Aphasia: Secondary | ICD-10-CM

## 2021-05-08 DIAGNOSIS — R41841 Cognitive communication deficit: Secondary | ICD-10-CM

## 2021-05-08 DIAGNOSIS — R41842 Visuospatial deficit: Secondary | ICD-10-CM

## 2021-05-08 DIAGNOSIS — R41844 Frontal lobe and executive function deficit: Secondary | ICD-10-CM

## 2021-05-08 DIAGNOSIS — M6281 Muscle weakness (generalized): Secondary | ICD-10-CM

## 2021-05-08 NOTE — Therapy (Signed)
[facility] [address] [place], [place], [address] Phone: [phone]   Fax:  [phone]  Speech Language Pathology Treatment  Patient Details  Name: Casey Branch MRN: [id number] Date of Birth: 11/12/1980 Referring Provider (SLP): Mariam Dollar, [place]   Encounter Date: 05/08/2021   End of Session - 05/08/21 1317     Visit Number 3    Number of Visits 25    Date for SLP Re-Evaluation 07/21/21    Authorization Type 30 ST    SLP Start Time 1146    SLP Stop Time  1230    SLP Time Calculation (min) 44 min    Activity Tolerance Patient tolerated treatment well             Past Medical History:  Diagnosis Date   Brain aneurysm     Past Surgical History:  Procedure Laterality Date   IR 3D INDEPENDENT WKST  03/11/2021   IR ANGIO INTRA EXTRACRAN SEL COM CAROTID INNOMINATE BILAT MOD SED  03/11/2021   IR ANGIO VERTEBRAL SEL VERTEBRAL BILAT MOD SED  03/11/2021   RADIOLOGY WITH ANESTHESIA N/A 03/11/2021   Procedure: IR WITH ANESTHESIA - DIAGNOSTIC CEREBRAL ANGIOGRAM;  Surgeon: Lisbeth Renshaw, MD;  Location: [facility] OR;  Service: Radiology;  Laterality: N/A;   VENTRICULOPERITONEAL SHUNT      There were no vitals filed for this visit.   Subjective Assessment - 05/08/21 1148     Subjective 'Im fine"    Patient is accompained by: Family member   wife, Casey Branch   Currently in Pain? No/denies                   ADULT SLP TREATMENT - 05/08/21 1149       General Information   Behavior/Cognition Alert;Cooperative;Pleasant mood      Treatment Provided   Treatment provided Cognitive-Linquistic      Cognitive-Linquistic Treatment   Treatment focused on Cognition;Patient/family/caregiver education    Skilled Treatment [name] reported pt is no longer taking any medications with some improvements in alertness as pt only napping 1x/day. SLP targeted how to include checking calendar (prefers physical calendar  over phone) as part of routine to aid management of appointments. SLP had pt map out morning routine, with usual cues needed to clarify timing for events (x: pt expressed he'd leave return home at 7 after they dropped son off at 7:15). SLP targeted functional time calculations, with usual mod cues required to figure out amount of time between events. Pt was working as Production designer, theatre/television/film in car detail shop and completed usual calculations to total expenses in log and calcuate chemicals needed. SLP targeted some functional math problems, in which pt required usual mod A to comprehend tasks and manipulate numbers. HEP provided for functional calculations. Of note, usual prompting needed in conversation to provide detail. Pt reports word finding "often" occurs.      Assessment / Recommendations / Plan   Plan Continue with current plan of care      Progression Toward Goals   Progression toward goals Progressing toward goals              SLP Education - 05/08/21 1318     Education Details functional math as necessary for returning to work detailing cars    [employer]) Educated Patient;Spouse    Methods Explanation;Demonstration    Comprehension Verbalized understanding;Returned demonstration;Need further instruction              SLP Short Term Goals - 05/08/21  1159       SLP SHORT TERM GOAL #1   Title pt will use memory compensations for checking appointment schedule in 3 sessions with min A    Time 4    Period Weeks    Status On-going    Target Date 05/23/21      SLP SHORT TERM GOAL #2   Title pt will demo knowledge of three non-physical deficits in 3 sessions    Time 4    Period Weeks    Status On-going    Target Date 05/23/21      SLP SHORT TERM GOAL #3   Title pt will demonstrate sustained attention for 8 minutes to a functional  cognitive linguistic task in 3 sesisons    Baseline 05-08-21    Time 4    Period Weeks    Status On-going    Target Date 05/23/21      SLP SHORT TERM  GOAL #4   Title pt will successfully use compensations for anomia in 10 minute simple-mod complex conversation in 3 sessions    Time 4    Period Weeks    Status On-going    Target Date 05/23/21      SLP SHORT TERM GOAL #5   Title complete formal cognitive linguisitic teasting    Status Achieved    Target Date 05/09/21              SLP Long Term Goals - 05/08/21 1316       SLP LONG TERM GOAL #1   Title pt will successfully use compensations for anomia in 10 minute mod-max complex conversation in 3 sessions    Time 8    Period Weeks    Status On-going    Target Date 06/20/21      SLP LONG TERM GOAL #2   Title pt will demo functional alternating attention between two mod complex linguistic tasks in 3 sessions    Time 12    Period Weeks    Status On-going    Target Date 07/18/21      SLP LONG TERM GOAL #3   Title pt will use memory compensations to accomplish things in his daily schedule (to-do list, etc) for/between 3 sessions    Time 12    Period Weeks    Status On-going    Target Date 07/18/21      SLP LONG TERM GOAL #4   Title pt will successfully use compensations for anomia in 10 minute max complex conversation in 3 sessions    Time 12    Period Weeks    Status On-going    Target Date 07/18/21              Plan - 05/08/21 1540     Clinical Impression Statement Casey Branch presents today with s/sx of a cognitive linguistic deficit in the areas of attention, awareness, memory, and aphasia.SLP targeted functional use of compensations to track appointments and functional math for time calculations and 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 Usual mod A required to prompt conversation and manipulate numbers. 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    Speech Therapy Frequency 2x / week    Duration 12 weeks     Treatment/Interventions Environmental controls;Functional tasks;SLP instruction and feedback;Cueing hierarchy;Language facilitation;Cognitive reorganization;Compensatory strategies;Internal/external aids;Patient/family education    Potential to Achieve Goals Good  SLP Home Exercise Plan provided    Consulted and Agree with Plan of Care Patient;Family member/caregiver    Family Member Consulted wife, Casey Branch             Patient will benefit from skilled therapeutic intervention in order to improve the following deficits and impairments:   Cognitive communication deficit  Aphasia    Problem List Patient Active Problem List   Diagnosis Date Noted   Brain aneurysm 04/21/2021   ICH (intracerebral hemorrhage) (HCC) 03/19/2021   Intraventricular hemorrhage (HCC) 03/08/2021    Janann Colonel, MA CCC-SLP 05/08/2021, 3:45 PM  [facility] [address] [place], [place], [address] Phone: [phone]   Fax:  [phone]   Name: Casey Branch MRN: [id number] Date of Birth: 07/07/1981

## 2021-05-08 NOTE — Therapy (Signed)
[facility] [address] [place], [place], [address] Phone: [phone]   Fax:  [phone]  Occupational Therapy Treatment  Patient Details  Name: Casey Branch MRN: [id number] Date of Birth: [birth year] Referring Provider (OT): Sula Soda   Encounter Date: 05/08/2021   OT End of Session - 05/08/21 1234     Visit Number 4    Number of Visits 17    Date for OT Re-Evaluation 07/02/21   10 weeks for allowing for scheduling difficulties   Authorization Type Self Pay - awating Medicaid approval. Per patient's spouse, insurance was effective 04/22/21 - will adjust as information is received ([employer])    OT Start Time 1233    OT Stop Time 1315    OT Time Calculation (min) 42 min    Activity Tolerance Patient tolerated treatment well    Behavior During Therapy [facility] for tasks assessed/performed             Past Medical History:  Diagnosis Date   Brain aneurysm     Past Surgical History:  Procedure Laterality Date   IR 3D INDEPENDENT WKST  03/11/2021   IR ANGIO INTRA EXTRACRAN SEL COM CAROTID INNOMINATE BILAT MOD SED  03/11/2021   IR ANGIO VERTEBRAL SEL VERTEBRAL BILAT MOD SED  03/11/2021   RADIOLOGY WITH ANESTHESIA N/A 03/11/2021   Procedure: IR WITH ANESTHESIA - DIAGNOSTIC CEREBRAL ANGIOGRAM;  Surgeon: Lisbeth Renshaw, MD;  Location: [facility] OR;  Service: Radiology;  Laterality: N/A;   VENTRICULOPERITONEAL SHUNT      There were no vitals filed for this visit.   Subjective Assessment - 05/08/21 1234     Subjective  It's good    Patient is accompanied by: Family member   spouse, [name]   Pertinent History CVA (brain aneurysm) 2004    Limitations no driving, impulsive    Currently in Pain? No/denies                          OT Treatments/Exercises (OP) - 05/08/21 1235       ADLs   Cooking "I made spaghetti last night" Pt verbalized sequence for making spaghetti and reported that it went  well. Pt cooked scrambled eggs with minimal cueing for locating hard to find items (silverware in 3rd drawer). Pt sequenced task and was able to recall checking stove for turning off burner. Pt does better with automatic and functional tasks that are very familiar vs paper/pencil tasks.      Cognitive Exercises   Problem Solving Logic Links with mod difficulty at first with mental flexibility with switching order of chips. Pt was able to complete 2 other levels with no additional asistance. Funcitonal problem solving activity with organizing day. Pt had max difficulty and required max cueing for breaking down information and sequencing. Pt had good ideas for grouping items together for the same location.                      OT Short Term Goals - 05/05/21 1243       OT SHORT TERM GOAL #1   Title Pt will be independent with initial HEP    Time 4    Period Weeks    Status On-going    Target Date 05/21/21      OT SHORT TERM GOAL #2   Title Pt will increase grip strength in RUE to 90 lbs or greater for increasing strength in R, dominant, hand.  Baseline R 81.5, L 111.5    Time 4    Period Weeks    Status On-going      OT SHORT TERM GOAL #3   Title Continue to assess vision and determine goals PRN    Time 4    Period Weeks    Status New      OT SHORT TERM GOAL #4   Title Pt will perform simple warm meal prep and/or light housekeeping with supervision and good safety awareness    Time 4    Period Weeks    Status New      OT SHORT TERM GOAL #5   Title Pt will perform environmental scanning with 90% accuracy in mod distracting environment    Baseline 12/14 in low distracting environment (86%)    Time 4    Period Weeks    Status New               OT Long Term Goals - 04/23/21 1437       OT LONG TERM GOAL #1   Title Pt will be independent with updated HEP    Time 10    Period Weeks    Status New    Target Date 07/02/21      OT LONG TERM GOAL #2   Title  Pt will increase grip strength by 15 lbs or greater in RUE for increasing functional strength in RUE, dominant, hand.    Baseline R 81.5, L 111.5    Time 10    Period Weeks    Status New      OT LONG TERM GOAL #3   Title Pt will perform physical and cognitive tasks simultaneously with 95% accuracy    Time 10    Period Weeks    Status New      OT LONG TERM GOAL #4   Title Pt will perform mod complex or follow instructions or recipe in cooking task with good safety awareness and with mod I.    Time 10    Period Weeks    Status New      OT LONG TERM GOAL #5   Title Pt and caregiver will verbalize understanding of return to work and return to driving recommendations    Time 10    Period Weeks    Status New                   Plan - 05/08/21 1331     Clinical Impression Statement Pt did well with cooking task today and reports getting back to cooking more at home. Encouraged to make grocery list for familiar meal over the weekend.    OT Occupational Profile and History Problem Focused Assessment - Including review of records relating to presenting problem    Occupational performance deficits (Please refer to evaluation for details): IADL's;Work;Leisure    Body Structure / Function / Physical Skills Decreased knowledge of use of DME;Strength;GMC;Dexterity;Proprioception;UE functional use;IADL;Vision;Flexibility;FMC;Endurance    Cognitive Skills Problem Solve;Sequencing;Safety Awareness;Attention;Memory    Rehab Potential Good    Clinical Decision Making Limited treatment options, no task modification necessary    Comorbidities Affecting Occupational Performance: None    Modification or Assistance to Complete Evaluation  No modification of tasks or assist necessary to complete eval    OT Frequency 2x / week    OT Duration Other (comment)   16 visits over 10 weeks for scheduling conflicts   OT Treatment/Interventions Self-care/ADL training;DME and/or AE instruction;Therapeutic  activities;Energy conservation;Patient/family  education;Visual/perceptual remediation/compensation;Therapeutic exercise;Cognitive remediation/compensation;Neuromuscular education;Functional Mobility Training    Plan more functional tasks, sequence work related tasks, Engineer, production and Agree with Plan of Care Patient;Family member/caregiver    Family Member Consulted spouse, Water quality scientist             Patient will benefit from skilled therapeutic intervention in order to improve the following deficits and impairments:   Body Structure / Function / Physical Skills: Decreased knowledge of use of DME, Strength, GMC, Dexterity, Proprioception, UE functional use, IADL, Vision, Flexibility, FMC, Endurance Cognitive Skills: Problem Solve, Sequencing, Safety Awareness, Attention, Memory     Visit Diagnosis: Muscle weakness (generalized)  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 [place], [place], [address] Phone: [phone]   Fax:  [phone]  Name: Casey Branch MRN: [id number] Date of Birth: 19-Dec-1980

## 2021-05-13 ENCOUNTER — Ambulatory Visit: Payer: 59 | Admitting: Occupational Therapy

## 2021-05-13 ENCOUNTER — Other Ambulatory Visit: Payer: Self-pay

## 2021-05-13 ENCOUNTER — Encounter: Payer: Self-pay | Admitting: Occupational Therapy

## 2021-05-13 ENCOUNTER — Ambulatory Visit: Payer: 59

## 2021-05-13 ENCOUNTER — Inpatient Hospital Stay: Payer: Self-pay | Admitting: Family

## 2021-05-13 DIAGNOSIS — R4701 Aphasia: Secondary | ICD-10-CM

## 2021-05-13 DIAGNOSIS — R41844 Frontal lobe and executive function deficit: Secondary | ICD-10-CM

## 2021-05-13 DIAGNOSIS — R41842 Visuospatial deficit: Secondary | ICD-10-CM

## 2021-05-13 DIAGNOSIS — I69151 Hemiplegia and hemiparesis following nontraumatic intracerebral hemorrhage affecting right dominant side: Secondary | ICD-10-CM

## 2021-05-13 DIAGNOSIS — M6281 Muscle weakness (generalized): Secondary | ICD-10-CM

## 2021-05-13 DIAGNOSIS — I619 Nontraumatic intracerebral hemorrhage, unspecified: Secondary | ICD-10-CM | POA: Diagnosis not present

## 2021-05-13 DIAGNOSIS — R41841 Cognitive communication deficit: Secondary | ICD-10-CM

## 2021-05-13 DIAGNOSIS — R41840 Attention and concentration deficit: Secondary | ICD-10-CM

## 2021-05-13 NOTE — Therapy (Signed)
[facility] [address] [place], [place], [address] Phone: [phone]   Fax:  [phone]  Occupational Therapy Treatment  Patient Details  Name: Casey Branch MRN: [id number] Date of Birth: Jun 13, 1981 Referring Provider (OT): Sula Soda   Encounter Date: 05/13/2021   OT End of Session - 05/13/21 1103     Visit Number 5    Number of Visits 17    Date for OT Re-Evaluation 07/02/21   10 weeks for allowing for scheduling difficulties   Authorization Type Self Pay - awating Medicaid approval. Per patient's spouse, insurance was effective 04/22/21 - will adjust as information is received ([employer])    OT Start Time 1101    OT Stop Time 1145    OT Time Calculation (min) 44 min    Activity Tolerance Patient tolerated treatment well    Behavior During Therapy [facility] for tasks assessed/performed             Past Medical History:  Diagnosis Date   Brain aneurysm     Past Surgical History:  Procedure Laterality Date   IR 3D INDEPENDENT WKST  03/11/2021   IR ANGIO INTRA EXTRACRAN SEL COM CAROTID INNOMINATE BILAT MOD SED  03/11/2021   IR ANGIO VERTEBRAL SEL VERTEBRAL BILAT MOD SED  03/11/2021   RADIOLOGY WITH ANESTHESIA N/A 03/11/2021   Procedure: IR WITH ANESTHESIA - DIAGNOSTIC CEREBRAL ANGIOGRAM;  Surgeon: Lisbeth Renshaw, MD;  Location: [facility] OR;  Service: Radiology;  Laterality: N/A;   VENTRICULOPERITONEAL SHUNT      There were no vitals filed for this visit.   Subjective Assessment - 05/13/21 1102     Subjective  "pretty good"    Patient is accompanied by: Family member   spouse, [name]   Pertinent History CVA (brain aneurysm) 2004    Limitations no driving, impulsive    Currently in Pain? No/denies                [facility] OT Assessment - 05/13/21 0001       Hand Function   Right Hand Grip (lbs) 59.5    Left Hand Grip (lbs) 90.8                      OT Treatments/Exercises (OP) -  05/13/21 1108       ADLs   Cooking pt reports going to the grocery store with son over the weekend with no difficulty.      Exercises   Exercises Hand      Cognitive Exercises   Attention Span Other Blink with sorting categories number, color and shape. Pt with mod difficulty with recall of category options and with slower processing speed.      Hand Exercises   Hand Gripper with Medium Beads hand gripper with picking up 1 inch blocks with RUE on level 3 with black spring. Pt could not tolerate level 4 and needed to downgrade. Pt with decreased grip strength on assessment oday. Continue to address and monitor.      Visual/Perceptual Exercises   Copy this Image Pegboard    Pegboard copied pattern (blue, green, yellow triangles) with little difficulty    Scanning Environmental    Scanning - Environmental 94% accuracy on first pass - mod distracting environment      Fine Motor Coordination (Hand/Wrist)   Fine Motor Coordination Small Pegboard    Small Pegboard coordination with RUE with min drops  OT Short Term Goals - 05/13/21 1111       OT SHORT TERM GOAL #1   Title Pt will be independent with initial HEP    Time 4    Period Weeks    Status Achieved    Target Date 05/21/21      OT SHORT TERM GOAL #2   Title Pt will increase grip strength in RUE to 90 lbs or greater for increasing strength in R, dominant, hand.    Baseline R 81.5, L 111.5    Time 4    Period Weeks    Status On-going      OT SHORT TERM GOAL #3   Title Continue to assess vision and determine goals PRN    Time 4    Period Weeks    Status Achieved      OT SHORT TERM GOAL #4   Title Pt will perform simple warm meal prep and/or light housekeeping with supervision and good safety awareness    Time 4    Period Weeks    Status Achieved      OT SHORT TERM GOAL #5   Title Pt will perform environmental scanning with 90% accuracy in mod distracting environment    Baseline  12/14 in low distracting environment (86%)    Time 4    Period Weeks    Status Achieved   94% on 05/13/21              OT Long Term Goals - 04/23/21 1437       OT LONG TERM GOAL #1   Title Pt will be independent with updated HEP    Time 10    Period Weeks    Status New    Target Date 07/02/21      OT LONG TERM GOAL #2   Title Pt will increase grip strength by 15 lbs or greater in RUE for increasing functional strength in RUE, dominant, hand.    Baseline R 81.5, L 111.5    Time 10    Period Weeks    Status New      OT LONG TERM GOAL #3   Title Pt will perform physical and cognitive tasks simultaneously with 95% accuracy    Time 10    Period Weeks    Status New      OT LONG TERM GOAL #4   Title Pt will perform mod complex or follow instructions or recipe in cooking task with good safety awareness and with mod I.    Time 10    Period Weeks    Status New      OT LONG TERM GOAL #5   Title Pt and caregiver will verbalize understanding of return to work and return to driving recommendations    Time 10    Period Weeks    Status New                   Plan - 05/13/21 1111     Clinical Impression Statement Pt with decreased grip strength today upon assessment. Will continue to monitor. Continue progressing towards unmet goals.    OT Occupational Profile and History Problem Focused Assessment - Including review of records relating to presenting problem    Occupational performance deficits (Please refer to evaluation for details): IADL's;Work;Leisure    Body Structure / Function / Physical Skills Decreased knowledge of use of DME;Strength;GMC;Dexterity;Proprioception;UE functional use;IADL;Vision;Flexibility;FMC;Endurance    Cognitive Skills Problem Solve;Sequencing;Safety Awareness;Attention;Memory    Rehab Potential Good  Clinical Decision Making Limited treatment options, no task modification necessary    Comorbidities Affecting Occupational Performance: None     Modification or Assistance to Complete Evaluation  No modification of tasks or assist necessary to complete eval    OT Frequency 2x / week    OT Duration Other (comment)   16 visits over 10 weeks for scheduling conflicts   OT Treatment/Interventions Self-care/ADL training;DME and/or AE instruction;Therapeutic activities;Energy conservation;Patient/family education;Visual/perceptual remediation/compensation;Therapeutic exercise;Cognitive remediation/compensation;Neuromuscular education;Functional Mobility Training    Plan more functional tasks, sequence work related tasks    Consulted and Agree with Plan of Care Patient;Family member/caregiver    Family Member Consulted spouse, Water quality scientist             Patient will benefit from skilled therapeutic intervention in order to improve the following deficits and impairments:   Body Structure / Function / Physical Skills: Decreased knowledge of use of DME, Strength, GMC, Dexterity, Proprioception, UE functional use, IADL, Vision, Flexibility, FMC, Endurance Cognitive Skills: Problem Solve, Sequencing, Safety Awareness, Attention, Memory     Visit Diagnosis: Muscle weakness (generalized)  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 [place], [place], [address] Phone: [phone]   Fax:  [phone]  Name: Casey Branch MRN: [id number] Date of Birth: 1981/04/30

## 2021-05-13 NOTE — Therapy (Signed)
[facility] [address] [place], [place], [address] Phone: [phone]   Fax:  [phone]  Speech Language Pathology Treatment  Patient Details  Name: Casey Branch MRN: [id number] Date of Birth: [birth year] Referring Provider (SLP): Mariam Dollar, [place]   Encounter Date: 05/13/2021   End of Session - 05/13/21 1446     Visit Number 4    Number of Visits 25    Date for SLP Re-Evaluation 07/21/21    Authorization Type 30 [address]    SLP Start Time 1021    SLP Stop Time  1101    SLP Time Calculation (min) 40 min    Activity Tolerance Patient tolerated treatment well             Past Medical History:  Diagnosis Date   Brain aneurysm     Past Surgical History:  Procedure Laterality Date   IR 3D INDEPENDENT WKST  03/11/2021   IR ANGIO INTRA EXTRACRAN SEL COM CAROTID INNOMINATE BILAT MOD SED  03/11/2021   IR ANGIO VERTEBRAL SEL VERTEBRAL BILAT MOD SED  03/11/2021   RADIOLOGY WITH ANESTHESIA N/A 03/11/2021   Procedure: IR WITH ANESTHESIA - DIAGNOSTIC CEREBRAL ANGIOGRAM;  Surgeon: Lisbeth Renshaw, MD;  Location: [facility] OR;  Service: Radiology;  Laterality: N/A;   VENTRICULOPERITONEAL SHUNT      There were no vitals filed for this visit.   Subjective Assessment - 05/13/21 1024     Subjective "I tried doing as much as I could on my own."    Patient is accompained by: Family member   [place] - wife   Currently in Pain? No/denies                   ADULT SLP TREATMENT - 05/13/21 1031       General Information   Behavior/Cognition Alert;Cooperative;Pleasant mood      Treatment Provided   Treatment provided Cognitive-Linquistic      Cognitive-Linquistic Treatment   Treatment focused on Cognition;Patient/family/caregiver education    Skilled Treatment SLP targeted pt's attention skills today (sustained/selective). Mowing crew outside SLP office at time of pt's appointment. Pt only looked out window x4 during  session; 3 times he turned back within 6-7 seconds, once SLP had to call pt's name for him to hold eye contact again with SLP after 10 seconds. Pt thought of similarities and differences with consistent cues for TWO similarities and TWO differences, as pt provided one or one of each, and then waited until cued to provide more responses. Usually, pt repeated an answer he provided earlier- either immediately or sometime during the task. SLP provided pt/wife with tips on cueing pt during homework during this time. SLP asked pt when he came next and he replied he did not know - SLP suggested pt have his calendar in his phone for reference no matter where he is.      Assessment / Recommendations / Plan   Plan Continue with current plan of care      Progression Toward Goals   Progression toward goals Progressing toward goals              SLP Education - 05/13/21 1445     Education Details cueing at home for homework, put appointments in phone    Person(s) Educated Patient;Spouse    Methods Explanation;Demonstration;Verbal cues    Comprehension Verbalized understanding;Verbal cues required;Need further instruction              SLP Short Term Goals -  05/13/21 1447       SLP SHORT TERM GOAL #1   Title pt will use memory compensations for checking appointment schedule in 3 sessions with min A    Time 4    Period Weeks    Status On-going    Target Date 05/23/21      SLP SHORT TERM GOAL #2   Title pt will demo knowledge of three non-physical deficits in 3 sessions    Time 4    Period Weeks    Status On-going    Target Date 05/23/21      SLP SHORT TERM GOAL #3   Title pt will demonstrate sustained attention for 8 minutes to a functional  cognitive linguistic task in 3 sesisons    Baseline 05-08-21    Time 4    Period Weeks    Status On-going    Target Date 05/23/21      SLP SHORT TERM GOAL #4   Title pt will successfully use compensations for anomia in 10 minute simple-mod  complex conversation in 3 sessions    Time 4    Period Weeks    Status On-going    Target Date 05/23/21      SLP SHORT TERM GOAL #5   Title complete formal cognitive linguisitic teasting    Status Achieved    Target Date 05/09/21              SLP Long Term Goals - 05/13/21 1447       SLP LONG TERM GOAL #1   Title pt will successfully use compensations for anomia in 10 minute mod-max complex conversation in 3 sessions    Time 8    Period Weeks    Status On-going    Target Date 06/20/21      SLP LONG TERM GOAL #2   Title pt will demo functional alternating attention between two mod complex linguistic tasks in 3 sessions    Time 12    Period Weeks    Status On-going    Target Date 07/18/21      SLP LONG TERM GOAL #3   Title pt will use memory compensations to accomplish things in his daily schedule (to-do list, etc) for/between 3 sessions    Time 12    Period Weeks    Status On-going    Target Date 07/18/21      SLP LONG TERM GOAL #4   Title pt will successfully use compensations for anomia in 10 minute max complex conversation in 3 sessions    Time 12    Period Weeks    Status On-going    Target Date 07/18/21              Plan - 05/13/21 1446     Clinical Impression Statement Casey Branch presents today with s/sx of a cognitive linguistic deficit in the areas of attention, awareness, memory, and aphasia.. 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    Speech Therapy Frequency 2x / week    Duration 12 weeks    Treatment/Interventions Environmental controls;Functional tasks;SLP instruction and feedback;Cueing hierarchy;Language facilitation;Cognitive reorganization;Compensatory strategies;Internal/external aids;Patient/family education    Potential to Achieve Goals Good    SLP Home Exercise Plan provided    Consulted  and Agree with Plan of Care Patient;Family member/caregiver    Family Member Consulted wife, [place]  Patient will benefit from skilled therapeutic intervention in order to improve the following deficits and impairments:   Cognitive communication deficit  Aphasia    Problem List Patient Active Problem List   Diagnosis Date Noted   Brain aneurysm 04/21/2021   ICH (intracerebral hemorrhage) (HCC) 03/19/2021   Intraventricular hemorrhage (HCC) 03/08/2021    [facility] ,MS, CCC-SLP  05/13/2021, 2:48 PM   [facility] [address] [place], [place], [address] Phone: [phone]   Fax:  [phone]   Name: Casey Branch MRN: [id number] Date of Birth: May 01, 1981

## 2021-05-15 ENCOUNTER — Ambulatory Visit: Payer: 59

## 2021-05-15 ENCOUNTER — Other Ambulatory Visit: Payer: Self-pay

## 2021-05-15 ENCOUNTER — Encounter: Payer: Self-pay | Admitting: Occupational Therapy

## 2021-05-15 ENCOUNTER — Ambulatory Visit: Payer: 59 | Admitting: Occupational Therapy

## 2021-05-15 DIAGNOSIS — M6281 Muscle weakness (generalized): Secondary | ICD-10-CM

## 2021-05-15 DIAGNOSIS — R41841 Cognitive communication deficit: Secondary | ICD-10-CM

## 2021-05-15 DIAGNOSIS — R41842 Visuospatial deficit: Secondary | ICD-10-CM

## 2021-05-15 DIAGNOSIS — I69151 Hemiplegia and hemiparesis following nontraumatic intracerebral hemorrhage affecting right dominant side: Secondary | ICD-10-CM

## 2021-05-15 DIAGNOSIS — R41844 Frontal lobe and executive function deficit: Secondary | ICD-10-CM

## 2021-05-15 DIAGNOSIS — I619 Nontraumatic intracerebral hemorrhage, unspecified: Secondary | ICD-10-CM | POA: Diagnosis not present

## 2021-05-15 DIAGNOSIS — R41840 Attention and concentration deficit: Secondary | ICD-10-CM

## 2021-05-15 DIAGNOSIS — R4701 Aphasia: Secondary | ICD-10-CM

## 2021-05-15 NOTE — Therapy (Signed)
[facility] [address] [place], [place], [address] Phone: [phone]   Fax:  [phone]  Occupational Therapy Treatment  Patient Details  Name: Casey Branch MRN: [id number] Date of Birth: 08-Dec-1980 Referring Provider (OT): Sula Soda   Encounter Date: 05/15/2021   OT End of Session - 05/15/21 1316     Visit Number 6    Number of Visits 17    Date for OT Re-Evaluation 07/02/21   10 weeks for allowing for scheduling difficulties   Authorization Type Self Pay - awating Medicaid approval. Per patient's spouse, insurance was effective 04/22/21 - will adjust as information is received ([facility])    OT Start Time 1315    OT Stop Time 1357    OT Time Calculation (min) 42 min    Activity Tolerance Patient tolerated treatment well    Behavior During Therapy [facility] for tasks assessed/performed             Past Medical History:  Diagnosis Date   Brain aneurysm     Past Surgical History:  Procedure Laterality Date   IR 3D INDEPENDENT WKST  03/11/2021   IR ANGIO INTRA EXTRACRAN SEL COM CAROTID INNOMINATE BILAT MOD SED  03/11/2021   IR ANGIO VERTEBRAL SEL VERTEBRAL BILAT MOD SED  03/11/2021   RADIOLOGY WITH ANESTHESIA N/A 03/11/2021   Procedure: IR WITH ANESTHESIA - DIAGNOSTIC CEREBRAL ANGIOGRAM;  Surgeon: Lisbeth Renshaw, MD;  Location: [facility] OR;  Service: Radiology;  Laterality: N/A;   VENTRICULOPERITONEAL SHUNT      There were no vitals filed for this visit.   Subjective Assessment - 05/15/21 1316     Subjective  Nothing new and denies pain    Patient is accompanied by: Family member   spouse, Casey Branch   Pertinent History CVA (brain aneurysm) 2004    Limitations no driving, impulsive    Currently in Pain? No/denies                          OT Treatments/Exercises (OP) - 05/15/21 1324       Cognitive Exercises   Attention Span Alternating Alternating Symbols on Constant Therapy level 4  without completion d/t increased time needed and patient with difficulty maintaining attention while OT and spouse were having conversation.    Attention Span Other environmental scanning with 100% accuracy with min cueing while locating A-O in sequential order. Pt received 2 verbal cues for process of task    Organization organizing grocery list for making going to the store easier. Pt req'd increased time and                      OT Short Term Goals - 05/13/21 1111       OT SHORT TERM GOAL #1   Title Pt will be independent with initial HEP    Time 4    Period Weeks    Status Achieved    Target Date 05/21/21      OT SHORT TERM GOAL #2   Title Pt will increase grip strength in RUE to 90 lbs or greater for increasing strength in R, dominant, hand.    Baseline R 81.5, L 111.5    Time 4    Period Weeks    Status On-going      OT SHORT TERM GOAL #3   Title Continue to assess vision and determine goals PRN    Time 4    Period Weeks  Status Achieved      OT SHORT TERM GOAL #4   Title Pt will perform simple warm meal prep and/or light housekeeping with supervision and good safety awareness    Time 4    Period Weeks    Status Achieved      OT SHORT TERM GOAL #5   Title Pt will perform environmental scanning with 90% accuracy in mod distracting environment    Baseline 12/14 in low distracting environment (86%)    Time 4    Period Weeks    Status Achieved   94% on 05/13/21              OT Long Term Goals - 04/23/21 1437       OT LONG TERM GOAL #1   Title Pt will be independent with updated HEP    Time 10    Period Weeks    Status New    Target Date 07/02/21      OT LONG TERM GOAL #2   Title Pt will increase grip strength by 15 lbs or greater in RUE for increasing functional strength in RUE, dominant, hand.    Baseline R 81.5, L 111.5    Time 10    Period Weeks    Status New      OT LONG TERM GOAL #3   Title Pt will perform physical and cognitive  tasks simultaneously with 95% accuracy    Time 10    Period Weeks    Status New      OT LONG TERM GOAL #4   Title Pt will perform mod complex or follow instructions or recipe in cooking task with good safety awareness and with mod I.    Time 10    Period Weeks    Status New      OT LONG TERM GOAL #5   Title Pt and caregiver will verbalize understanding of return to work and return to driving recommendations    Time 10    Period Weeks    Status New                   Plan - 05/15/21 1435     Clinical Impression Statement Pt continuing to progress with attention.    OT Occupational Profile and History Problem Focused Assessment - Including review of records relating to presenting problem    Occupational performance deficits (Please refer to evaluation for details): IADL's;Work;Leisure    Body Structure / Function / Physical Skills Decreased knowledge of use of DME;Strength;GMC;Dexterity;Proprioception;UE functional use;IADL;Vision;Flexibility;FMC;Endurance    Cognitive Skills Problem Solve;Sequencing;Safety Awareness;Attention;Memory    Rehab Potential Good    Clinical Decision Making Limited treatment options, no task modification necessary    Comorbidities Affecting Occupational Performance: None    Modification or Assistance to Complete Evaluation  No modification of tasks or assist necessary to complete eval    OT Frequency 2x / week    OT Duration Other (comment)   16 visits over 10 weeks for scheduling conflicts   OT Treatment/Interventions Self-care/ADL training;DME and/or AE instruction;Therapeutic activities;Energy conservation;Patient/family education;Visual/perceptual remediation/compensation;Therapeutic exercise;Cognitive remediation/compensation;Neuromuscular education;Functional Mobility Training    Plan more functional tasks, sequence work related tasks    Consulted and Agree with Plan of Care Patient;Family member/caregiver    Family Member Consulted spouse,  Casey Branch             Patient will benefit from skilled therapeutic intervention in order to improve the following deficits and impairments:   Body Structure /  Function / Physical Skills: Decreased knowledge of use of DME, Strength, GMC, Dexterity, Proprioception, UE functional use, IADL, Vision, Flexibility, FMC, Endurance Cognitive Skills: Problem Solve, Sequencing, Safety Awareness, Attention, Memory     Visit Diagnosis: Muscle weakness (generalized)  Attention and concentration deficit  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 [place], [place], [address] Phone: [phone]   Fax:  [phone]  Name: Casey Branch MRN: [id number] Date of Birth: 12-02-1980

## 2021-05-15 NOTE — Therapy (Signed)
[facility] [address] [place], [place], [address] Phone: [phone]   Fax:  [phone]  Speech Language Pathology Treatment  Patient Details  Name: Casey Branch MRN: [id number] Date of Birth: Mar 10, 1981 Referring Provider (SLP): Mariam Dollar, [place]   Encounter Date: 05/15/2021   End of Session - 05/15/21 1222     Visit Number 5    Number of Visits 25    Date for SLP Re-Evaluation 07/21/21    Authorization Type 30 ST    SLP Start Time 1230    SLP Stop Time  1315    SLP Time Calculation (min) 45 min    Activity Tolerance Patient tolerated treatment well             Past Medical History:  Diagnosis Date   Brain aneurysm     Past Surgical History:  Procedure Laterality Date   IR 3D INDEPENDENT WKST  03/11/2021   IR ANGIO INTRA EXTRACRAN SEL COM CAROTID INNOMINATE BILAT MOD SED  03/11/2021   IR ANGIO VERTEBRAL SEL VERTEBRAL BILAT MOD SED  03/11/2021   RADIOLOGY WITH ANESTHESIA N/A 03/11/2021   Procedure: IR WITH ANESTHESIA - DIAGNOSTIC CEREBRAL ANGIOGRAM;  Surgeon: Lisbeth Renshaw, MD;  Location: [facility] OR;  Service: Radiology;  Laterality: N/A;   VENTRICULOPERITONEAL SHUNT      There were no vitals filed for this visit.   Subjective Assessment - 05/15/21 1223     Subjective "I'm tired"    Patient is accompained by: Family member   wife, [place]   Currently in Pain? No/denies                   ADULT SLP TREATMENT - 05/15/21 1222       General Information   Behavior/Cognition Alert;Cooperative;Pleasant mood      Treatment Provided   Treatment provided Cognitive-Linquistic      Cognitive-Linquistic Treatment   Treatment focused on Cognition;Patient/family/caregiver education    Skilled Treatment Some recent difficulty with sleeping reported and pt endorsed feeling tired today. HEP completed last night, in which pt's wife endorsed patient frequently repeated same answer with usual  clarification needed. SLP targeted convergent naming tasks, with usual extended time and rare min to mod cues required to name. Some assistance required for reading, which is different from baseline. Some reduced comprehension of math problem reported on HEP. SLP targeted comprehension and attention for following instruction tasks. Usual independent re-reading of instructions noted to aid comprehension. SLP cued double check, in which pt ID'd majority of mistakes with additional processing time. Some intermittent min to mod prompting required to identify other errors. Pt noted to make same mistakes on next page  prior to exiting ST room, with cues provided to re-read instructions.      Assessment / Recommendations / Plan   Plan Continue with current plan of care      Progression Toward Goals   Progression toward goals Progressing toward goals              SLP Education - 05/15/21 1406     Education Details re-reading instructions for comprehension, double check    Person(s) Educated Patient;Spouse    Methods Explanation;Demonstration;Verbal cues;Handout    Comprehension Verbalized understanding;Returned demonstration;Verbal cues required;Need further instruction              SLP Short Term Goals - 05/15/21 1223       SLP SHORT TERM GOAL #1   Title pt will use memory compensations for checking  appointment schedule in 3 sessions with min A    Time 4    Period Weeks    Status On-going    Target Date 05/23/21      SLP SHORT TERM GOAL #2   Title pt will demo knowledge of three non-physical deficits in 3 sessions    Time 4    Period Weeks    Status On-going    Target Date 05/23/21      SLP SHORT TERM GOAL #3   Title pt will demonstrate sustained attention for 8 minutes to a functional  cognitive linguistic task in 3 sesisons    Baseline 05-08-21, 05-15-21    Time 4    Period Weeks    Status On-going    Target Date 05/23/21      SLP SHORT TERM GOAL #4   Title pt will  successfully use compensations for anomia in 10 minute simple-mod complex conversation in 3 sessions    Time 4    Period Weeks    Status On-going    Target Date 05/23/21      SLP SHORT TERM GOAL #5   Title complete formal cognitive linguisitic teasting    Status Achieved    Target Date 05/09/21              SLP Long Term Goals - 05/15/21 1223       SLP LONG TERM GOAL #1   Title pt will successfully use compensations for anomia in 10 minute mod-max complex conversation in 3 sessions    Time 8    Period Weeks    Status On-going    Target Date 06/20/21      SLP LONG TERM GOAL #2   Title pt will demo functional alternating attention between two mod complex linguistic tasks in 3 sessions    Time 12    Period Weeks    Status On-going    Target Date 07/18/21      SLP LONG TERM GOAL #3   Title pt will use memory compensations to accomplish things in his daily schedule (to-do list, etc) for/between 3 sessions    Time 12    Period Weeks    Status On-going    Target Date 07/18/21      SLP LONG TERM GOAL #4   Title pt will successfully use compensations for anomia in 10 minute max complex conversation in 3 sessions    Time 12    Period Weeks    Status On-going    Target Date 07/18/21              Plan - 05/15/21 1222     Clinical Impression Statement Casey Branch presents today with s/sx of a cognitive linguistic deficit in the areas of attention, awareness, memory, and aphasia. SLP targeted naming and comprehension tasks, which required usual extended time and intermittent min to mod A to ID and correct errors. 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    Speech Therapy Frequency 2x / week    Duration 12 weeks    Treatment/Interventions Environmental controls;Functional tasks;SLP instruction and feedback;Cueing  hierarchy;Language facilitation;Cognitive reorganization;Compensatory strategies;Internal/external aids;Patient/family education    Potential to Achieve Goals Good    SLP Home Exercise Plan provided    Consulted and Agree with Plan of Care Patient;Family member/caregiver  Patient will benefit from skilled therapeutic intervention in order to improve the following deficits and impairments:   Aphasia  Cognitive communication deficit    Problem List Patient Active Problem List   Diagnosis Date Noted   Brain aneurysm 04/21/2021   ICH (intracerebral hemorrhage) (HCC) 03/19/2021   Intraventricular hemorrhage (HCC) 03/08/2021    Janann Colonel, MA CCC-SLP 05/15/2021, 2:08 PM  [facility] [address] [place], [place], [address] Phone: [phone]   Fax:  [phone]   Name: Casey Branch MRN: [id number] Date of Birth: [birth year]

## 2021-05-19 NOTE — Progress Notes (Signed)
Radiation Oncology         [phone] ________________________________  Initial Outpatient Consultation  Name: Casey Branch MRN: [id number]  Date: 05/20/2021  DOB: 1981/05/18  CC:Pcp, No  Judith Part, MD   REFERRING PHYSICIAN: Judith Part, MD  DIAGNOSIS:    ICD-10-CM   1. 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 male who presented to the [facility] ED on 03/08/21 following an episode of confusion and obtundation resulting in a fall. On EMS arrival, the patient was noted to exhibit left gaze deviation and right hemiplegia and was intubated upon arrival to the ED for airway protection. CT of the head taken during ED course demonstrated large volume intraventricular hemorrhage primarily in the left lateral ventricle, but also in the right ventricle and third ventricle. Per the patients family; patient had a past hemorrhage in a similar location years ago with ventriculoperitoneal shunt placement however did not follow up for treatment of arteriovenous malformations. CTA taken was suggestive of left thalamic AVM. The nidus appears to be within the left lateral ventricle and may be associated with the choroid plexus. The nidus measures approximately 35 x 28 mm in diameter.   Per the encounter summary, he developed hydrocephalus and received placement of external ventricular drain on 03/09/21. The patient underwent ventriculostomy on 03/08/2021 per Dr. Venetia Constable, followed by diagnostic cerebral angiogram per Dr. Kathyrn Sheriff revealing [name] grade 4 thalamic AVM. (EVD was later removed on 03/13/21). The patient was cleared to be extubated on 03/13/21 then discharged on 03/19/21. Final discharge diagnosis was ruptured intracranial arteriovenous  malformation.   Most recently, the patient met with Dr. Zada Finders, [facility], on 05/16/21. During which time, Dr. Zada Finders discussed with the patient recent findings, and ultimately recommended for the patient to proceed with SRS (to avoid increased risk of hemorrhage associated with embolizing the intracranial aneurysms).   He is receiving various forms of rehabilitative therapy as an outpatient.  He reports subjective weakness in his right arm and right leg and some short-term memory difficulty.  He is on leave from work.  He is here with his wife today who appears very supportive.  They report that this diagnosis has been very difficult for both of them emotionally and they are open to counseling.  He denies any recent seizures, headaches, nausea, dizziness, ataxia, coordination issues, numbness, visual deficits.  No prior history of radiation, no pacemaker or ICD.  PREVIOUS RADIATION THERAPY: No  PAST MEDICAL HISTORY:  has a past medical history of Brain aneurysm.    PAST SURGICAL HISTORY: Past Surgical History:  Procedure Laterality Date   IR 3D INDEPENDENT WKST  03/11/2021   IR ANGIO INTRA EXTRACRAN SEL COM CAROTID INNOMINATE BILAT MOD SED  03/11/2021   IR ANGIO VERTEBRAL SEL VERTEBRAL BILAT MOD SED  03/11/2021   RADIOLOGY WITH ANESTHESIA N/A 03/11/2021   Procedure: IR WITH ANESTHESIA - DIAGNOSTIC CEREBRAL ANGIOGRAM;  Surgeon: Consuella Lose, MD;  Location: [facility];  Service: Radiology;  Laterality: N/A;   VENTRICULOPERITONEAL SHUNT      FAMILY HISTORY: family history is not on file.  SOCIAL HISTORY:  reports that he has quit smoking. His smoking use included cigarettes. He has never been exposed to tobacco smoke. He has never used  smokeless tobacco. He reports that he does not currently use alcohol. He reports that he does not use drugs.  ALLERGIES: Patient has no known allergies.  MEDICATIONS:  Current Outpatient Medications  Medication Sig Dispense Refill    LORazepam (ATIVAN) 1 MG tablet Take 1 tablet by mouth 30 minutes before procedures in radiology or radiation oncology PRN claustrophobia. 3 tablet 0   melatonin 3 MG TABS tablet Take 1 tablet (3 mg total) by mouth at bedtime as needed. (Patient not taking: No sig reported) 30 tablet 0   No current facility-administered medications for this encounter.    REVIEW OF SYSTEMS:  Notable for that above.   PHYSICAL EXAM:  height is 6' 0.5" (1.842 m) and weight is 171 lb 8 oz (77.8 kg). His temporal temperature is 96.4 F (35.8 C) (abnormal). His blood pressure is 122/82 and his pulse is 66. His respiration is 18 and oxygen saturation is 100%.   General: Alert and oriented, in mild emotional distress  HEENT: Head is normocephalic. Extraocular movements are intact. Oropharynx is clear.  Mild erythema of sclera bilaterally Neck: Neck is supple, no palpable cervical or supraclavicular lymphadenopathy. Heart: Regular in rate and rhythm with no murmurs, rubs, or gallops. Chest: Clear to auscultation bilaterally, with no rhonchi, wheezes, or rales. Extremities: No cyanosis or edema. Lymphatics: see Neck Exam Skin: No concerning lesions. Musculoskeletal: symmetric strength and muscle tone throughout.  I cannot appreciate any obvious right-sided weakness Neurologic: Cranial nerves II through XII are grossly intact. No obvious focalities. Speech is fluent. Coordination is intact.  Object recall is 3 out of 3 at 3 minutes. Psychiatric: Judgment and insight are intact. Affect is appropriate.     LABORATORY DATA:  Lab Results  Component Value Date   WBC 5.5 03/31/2021   HGB 13.2 03/31/2021   HCT 38.8 (L) 03/31/2021   MCV 90.4 03/31/2021   PLT 366 03/31/2021   CMP     Component Value Date/Time   NA 133 (L) 03/31/2021 0844   K 4.0 03/31/2021 0844   CL 99 03/31/2021 0844   CO2 28 03/31/2021 0844   GLUCOSE 95 03/31/2021 0844   BUN 14 03/31/2021 0844   CREATININE 1.02 03/31/2021 0844   CALCIUM 8.9  03/31/2021 0844   PROT 6.9 03/08/2021 1212   ALBUMIN 2.9 (L) 03/16/2021 0743   AST 27 03/08/2021 1212   ALT 26 03/08/2021 1212   ALKPHOS 44 03/08/2021 1212   BILITOT 0.9 03/08/2021 1212   GFRNONAA >60 03/31/2021 0844        RADIOGRAPHY: As above    IMPRESSION/PLAN:  Today, I talked to the patient about the findings and work-up thus far.  We discussed the patient's diagnosis of left thalamic AVM and general treatment for this, highlighting the role of radiosurgery in the management.  We discussed the available radiation techniques, and focused on the details of logistics and delivery.     We discussed the risks, benefits, and side effects of radiosurgery. Side effects may include but not necessarily be limited to: Low-grade headache, fatigue, radionecrosis, brain injury, bleed in the brain, secondary cancer; no guarantees of treatment were given.  It was explained to the patient and his wife that it often takes at least a year for the risk of bleeding to go down substantially posttreatment. A consent form was signed and placed in the patient's medical record.  The patient was encouraged to ask questions that I answered to the best of my ability.  Dr. Tammi Klippel was present  for the consent session.  I introduced the patient and his wife to Dr. Tammi Klippel as he subspecializes in the treatment of this condition.  The patient and his wife understand I recommend that he pursue his care with Dr. Tammi Klippel moving forward.  I have personally discussed his case with Dr. Tammi Klippel. Dr. Tammi Klippel will work with neurosurgery on his case.  Patient was also introduced to Mont Dutton our navigator who will coordinate pretreatment MRI and CT simulation and mask fabrication.  Given the patient's claustrophobia prescription was provided today for lorazepam.   The patient has erythema of the sclera of unclear etiology but he has been sleeping poorly which may be contributing.  Recommended that he try artificial  tears.  Regarding the patient's emotional distress as well as the emotional distress of his wife a referral has been made to spiritual care for counseling.  They are enthusiastic about this.  I wished him the best moving forward and he will continue care with Dr. Tammi Klippel as above.   On date of service, in total, I spent 65 minutes on this encounter. Patient was seen in person.   __________________________________________   Eppie Gibson, MD  This document serves as a record of services personally performed by Eppie Gibson, MD. It was created on her behalf by Roney Mans, a trained medical scribe. The creation of this record is based on the scribe's personal observations and the provider's statements to them. This document has been checked and approved by the attending provider.

## 2021-05-19 NOTE — Progress Notes (Signed)
Location/Histology of Brain Tumor:  Verlee Rossetti grade 4 LEFT thalamic AVM (Arteriovenous Malformations)  Patient presented with symptoms of:  presented to ED via EMS on 03/08/2021 due to  acute onset mental status change and right side weakness (history of intracranial aneurysm and VP shunt). He had a syncopal episode at home while in the bathroom that was not visually witnessed by wife, but during which she heard a "thump" and when she found him he was on the floor. He was able to get up and verbalize that he did not feel well. He laid down and then tried to get up again but was not able to and was becoming incoherent, so his wife called EMS. On EMS arrival, the patient was altered, with left gaze deviation and right hemiplegia  Past or anticipated interventions, if any, per neurosurgery:  05/15/2021 Dr. Autumn Patty (office visit)  03/11/2021 Dr. Lisbeth Renshaw  --Diagnostic cerebral angiogramDiagnostic cerebral angiogram  03/08/2021 Dr. Autumn Patty --Right frontal ventriculostomy   Past or anticipated interventions, if any, per medical oncology:  No referral placed at this time  Dose of Decadron, if applicable: Not currently prescribed   Recent neurologic symptoms, if any:  Seizures: Patient denies Headaches: Patient denies Nausea: Patient denies  Dizziness/ataxia: Patient denies Difficulty with hand coordination: Patient denies Focal numbness/weakness: Patient denies Visual deficits/changes: Patient denies Confusion/Memory deficits: Patient denies, but significant other reports issues with short term memory   SAFETY ISSUES: Prior radiation? No Pacemaker/ICD? No Possible current pregnancy? N/A Is the patient on methotrexate? No  Additional Complaints / other details: Patient has received the first 2 Moderna vaccines

## 2021-05-20 ENCOUNTER — Encounter: Payer: Self-pay | Admitting: Radiation Oncology

## 2021-05-20 ENCOUNTER — Other Ambulatory Visit: Payer: Self-pay | Admitting: Radiation Therapy

## 2021-05-20 ENCOUNTER — Ambulatory Visit
Admission: RE | Admit: 2021-05-20 | Discharge: 2021-05-20 | Disposition: A | Discharge status: Home / Self Care | Payer: 59 | Source: Ambulatory Visit | Attending: Radiation Oncology | Admitting: Radiation Oncology

## 2021-05-20 ENCOUNTER — Other Ambulatory Visit: Payer: Self-pay

## 2021-05-20 ENCOUNTER — Encounter: Payer: Self-pay | Admitting: General Practice

## 2021-05-20 VITALS — BP 122/82 | HR 66 | Temp 96.4°F | Resp 18 | Ht 72.5 in | Wt 171.5 lb

## 2021-05-20 DIAGNOSIS — Q282 Arteriovenous malformation of cerebral vessels: Secondary | ICD-10-CM | POA: Insufficient documentation

## 2021-05-20 DIAGNOSIS — Z87891 Personal history of nicotine dependence: Secondary | ICD-10-CM | POA: Insufficient documentation

## 2021-05-20 DIAGNOSIS — Q283 Other malformations of cerebral vessels: Secondary | ICD-10-CM

## 2021-05-20 DIAGNOSIS — Q2730 Arteriovenous malformation, site unspecified: Secondary | ICD-10-CM

## 2021-05-20 DIAGNOSIS — R531 Weakness: Secondary | ICD-10-CM | POA: Insufficient documentation

## 2021-05-20 MED ORDER — LORAZEPAM 1 MG PO TABS: ORAL_TABLET | ORAL | 0 refills | Status: DC

## 2021-05-20 NOTE — Progress Notes (Signed)
[facility] Spiritual Care Note  Referred by Osie Cheeks for spiritual and emotional support for Emet and his wife [place] per her request. Reached them by phone Raoul Pitch is still on FMLA) and set up a phone appointment for Thursday 9/1 at 10 am. They verbalized appreciation for having a place to process what they've been through recently.   [address] Rush [place], [place], [facility] Pager [phone] Voicemail [phone]

## 2021-05-21 ENCOUNTER — Telehealth: Payer: Self-pay | Admitting: Radiation Therapy

## 2021-05-21 ENCOUNTER — Ambulatory Visit: Payer: 59 | Admitting: Occupational Therapy

## 2021-05-21 ENCOUNTER — Ambulatory Visit: Payer: 59

## 2021-05-21 NOTE — Telephone Encounter (Signed)
Spoke with the patient's wife, [name], to see how Adom is doing today and to also verify that they have seen the planning and treatment appointments set up for him in MyChart. Jacquan is doing better today, yesterday was a bit overwhelming for him. He has processed the information and now feels encouraged to move forward to get this behind him. [name] confirmed that they have seen his appointments, we discussed when Kara should take the Ativan prior to his visits and she was encouraged to call me if either of them have questions along the way.   Jalene Mullet R.T.(R)(T) Radiation Special Procedures Navigator

## 2021-05-22 ENCOUNTER — Encounter: Payer: Self-pay | Admitting: General Practice

## 2021-05-22 NOTE — Progress Notes (Signed)
[facility] Spiritual Care Note  Per Mr Depaoli's request, rescheduled phone appointment to tomorrow 05/23/2021 at 10 am.   Casey Branch, [facility] Pager [phone] Voicemail [phone]

## 2021-05-23 ENCOUNTER — Encounter: Payer: Self-pay | Admitting: General Practice

## 2021-05-23 ENCOUNTER — Ambulatory Visit: Payer: 59 | Admitting: Occupational Therapy

## 2021-05-23 ENCOUNTER — Ambulatory Visit: Payer: 59

## 2021-05-23 NOTE — Progress Notes (Signed)
[facility] Spiritual Care Note  Per Casey Branch's request, we have rescheduled his phone appointment to [facility] 05/27/2021 at 11am.   [facility], [place], [facility] Pager [phone] Voicemail [phone]

## 2021-05-27 ENCOUNTER — Ambulatory Visit: Payer: 59

## 2021-05-27 ENCOUNTER — Encounter: Payer: Self-pay | Admitting: Occupational Therapy

## 2021-05-27 ENCOUNTER — Ambulatory Visit: Payer: 59 | Admitting: Occupational Therapy

## 2021-05-27 ENCOUNTER — Encounter: Payer: Self-pay | Admitting: General Practice

## 2021-05-27 ENCOUNTER — Other Ambulatory Visit: Payer: Self-pay

## 2021-05-27 DIAGNOSIS — R41840 Attention and concentration deficit: Secondary | ICD-10-CM | POA: Insufficient documentation

## 2021-05-27 DIAGNOSIS — R41841 Cognitive communication deficit: Secondary | ICD-10-CM | POA: Insufficient documentation

## 2021-05-27 DIAGNOSIS — R4701 Aphasia: Secondary | ICD-10-CM | POA: Diagnosis not present

## 2021-05-27 DIAGNOSIS — R41842 Visuospatial deficit: Secondary | ICD-10-CM | POA: Insufficient documentation

## 2021-05-27 DIAGNOSIS — R482 Apraxia: Secondary | ICD-10-CM | POA: Diagnosis not present

## 2021-05-27 DIAGNOSIS — R41844 Frontal lobe and executive function deficit: Secondary | ICD-10-CM | POA: Insufficient documentation

## 2021-05-27 DIAGNOSIS — M6281 Muscle weakness (generalized): Secondary | ICD-10-CM | POA: Insufficient documentation

## 2021-05-27 DIAGNOSIS — Z510 Encounter for antineoplastic radiation therapy: Secondary | ICD-10-CM | POA: Insufficient documentation

## 2021-05-27 DIAGNOSIS — I69151 Hemiplegia and hemiparesis following nontraumatic intracerebral hemorrhage affecting right dominant side: Secondary | ICD-10-CM | POA: Insufficient documentation

## 2021-05-27 DIAGNOSIS — Q282 Arteriovenous malformation of cerebral vessels: Secondary | ICD-10-CM | POA: Diagnosis not present

## 2021-05-27 NOTE — Progress Notes (Signed)
[facility] Spiritual Care Note  Spoke with Casey Branch by phone to reschedule today's phone appointment to tomorrow at 11:30.   [address] [place], [place], [facility] Pager [phone] Voicemail [phone]

## 2021-05-27 NOTE — Therapy (Signed)
[place] [address] [place], [place], [address] Phone: [phone]   Fax:  [phone]  Speech Language Pathology Treatment  Patient Details  Name: Casey Branch MRN: [id number] Date of Birth: May 02, 1981 Referring Provider (SLP): Lauraine Rinne, [place]   Encounter Date: 05/27/2021   End of Session - 05/27/21 1247     Visit Number 6    Number of Visits 25    Date for SLP Re-Evaluation 07/21/21    Authorization Type 30 ST    SLP Start Time 1231    SLP Stop Time  1315    SLP Time Calculation (min) 44 min    Activity Tolerance Patient tolerated treatment well             Past Medical History:  Diagnosis Date   Brain aneurysm     Past Surgical History:  Procedure Laterality Date   IR 3D INDEPENDENT WKST  03/11/2021   IR ANGIO INTRA EXTRACRAN SEL COM CAROTID INNOMINATE BILAT MOD SED  03/11/2021   IR ANGIO VERTEBRAL SEL VERTEBRAL BILAT MOD SED  03/11/2021   RADIOLOGY WITH ANESTHESIA N/A 03/11/2021   Procedure: IR WITH ANESTHESIA - DIAGNOSTIC CEREBRAL ANGIOGRAM;  Surgeon: Consuella Lose, MD;  Location: [place];  Service: Radiology;  Laterality: N/A;   VENTRICULOPERITONEAL SHUNT      There were no vitals filed for this visit.   Subjective Assessment - 05/27/21 1233     Subjective he lost his voice yesteday per wife    Currently in Pain? No/denies                   ADULT SLP TREATMENT - 05/27/21 1232       General Information   Behavior/Cognition Alert;Cooperative;Pleasant mood      Treatment Provided   Treatment provided Cognitive-Linquistic      Cognitive-Linquistic Treatment   Treatment focused on Cognition;Patient/family/caregiver education    Skilled Treatment Pt presented with hoarse voice this session, with focus on structured tasks completed today. [name] reports pt has been driving in neighborhood and going to grocery store with ability to recall 4-5 items at a time without  difficulty. SLP provided education re: safety precautions for return to driving due to reduced awareness of deficits/errors and reduced processing speed. [name] endorsed MD suggested medication for attention, which may be beneficial. [name] noted pt often requires cues to attention, in which pt stated "I'm just thinking" and elaborated on possible internal distractions related to current medical conditions. Pt is scheduled for radiation treatment x1 next week. SLP targeted recall of short written informative essay, in which pt stated he was unable to complete without re-reading paragraph for each question. Extended time required to comprehend and analyze each problem. Pt noted to frequently re-read each question and same lines of paragraph. Pt was intermittently distracted by minimal background noise.      Assessment / Recommendations / Plan   Plan Continue with current plan of care      Progression Toward Goals   Progression toward goals Progressing toward goals              SLP Education - 05/27/21 1253     Education Details precautions for driving, double check work due to reduced error awareness.    Person(s) Educated Patient;Spouse    Methods Explanation;Demonstration;Handout    Comprehension Verbalized understanding;Returned demonstration;Need further instruction              SLP Short Term Goals - 05/27/21  [place] #1   Title pt will use memory compensations for checking appointment schedule in 3 sessions with min A    Status Not Met    Target Date 05/23/21      SLP SHORT TERM GOAL #2   Title pt will demo knowledge of three non-physical deficits in 3 sessions    Status Not Met    Target Date 05/23/21      SLP SHORT TERM GOAL #3   Title pt will demonstrate sustained attention for 8 minutes to a functional  cognitive linguistic task in 3 sesisons    Baseline 05-08-21, 05-15-21, 05-27-21    Status Achieved    Target Date 05/23/21      SLP  SHORT TERM GOAL #4   Title pt will successfully use compensations for anomia in 10 minute simple-mod complex conversation in 3 sessions    Status Partially Met    Target Date 05/23/21      SLP SHORT TERM GOAL #5   Title complete formal cognitive linguisitic teasting    Status Achieved    Target Date 05/09/21              SLP Long Term Goals - 05/27/21 1248       SLP LONG TERM GOAL #1   Title pt will successfully use compensations for anomia in 10 minute mod-max complex conversation in 3 sessions    Time 8    Period Weeks    Status On-going    Target Date 06/20/21      SLP LONG TERM GOAL #2   Title pt will demo functional alternating attention between two mod complex linguistic tasks in 3 sessions    Time 12    Period Weeks    Status On-going    Target Date 07/18/21      SLP LONG TERM GOAL #3   Title pt will use memory compensations to accomplish things in his daily schedule (to-do list, etc) for/between 3 sessions    Time 12    Period Weeks    Status On-going    Target Date 07/18/21      SLP LONG TERM GOAL #4   Title pt will successfully use compensations for anomia in 10 minute max complex conversation in 3 sessions    Time 12    Period Weeks    Status On-going    Target Date 07/18/21              Plan - 05/27/21 1248     Clinical Impression Statement Dracen presents today with s/sx of a cognitive linguistic deficits in the areas of attention, awareness, memory, and aphasia. SLP targeted reading comprehension tasks, which required usual extended time to increase comprehension and ID errors. 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    Speech Therapy Frequency 2x / week    Duration 12 weeks    Treatment/Interventions Environmental controls;Functional tasks;SLP instruction and feedback;Cueing  hierarchy;Language facilitation;Cognitive reorganization;Compensatory strategies;Internal/external aids;Patient/family education    Potential to Achieve Goals Good    SLP Home Exercise Plan provided    Consulted and Agree with Plan of Care Patient;Family member/caregiver    Family Member Consulted wife, Caryl Comes             Patient will benefit from skilled therapeutic intervention in order  to improve the following deficits and impairments:   Cognitive communication deficit  Aphasia    Problem List Patient Active Problem List   Diagnosis Date Noted   AVM (arteriovenous malformation) brain 05/20/2021   Brain aneurysm 04/21/2021   ICH (intracerebral hemorrhage) ([place]) 03/19/2021   Intraventricular hemorrhage ([place]) 03/08/2021    Alinda Deem, MA CCC-SLP 05/27/2021, 1:23 PM  [place] [address] [place], [place], [address] Phone: [phone]   Fax:  [phone]   Name: Yaakov Saindon MRN: [id number] Date of Birth: [birth year]

## 2021-05-27 NOTE — Therapy (Signed)
[facility] [address] [place], [place], [address] Phone: [phone]   Fax:  [phone]  Occupational Therapy Treatment  Patient Details  Name: Casey Branch MRN: [id number] Date of Birth: November 25, 1980 Referring Provider (OT): Sula Soda   Encounter Date: 05/27/2021   OT End of Session - 05/27/21 1317     Visit Number 7    Number of Visits 17    Date for OT Re-Evaluation 07/02/21   10 weeks for allowing for scheduling difficulties   Authorization Type [employer]    Authorization Time Period VL: 30 PT\OT, 30 ST    Authorization - Number of Visits 30    OT Start Time 1316    OT Stop Time 1400    OT Time Calculation (min) 44 min    Activity Tolerance Patient tolerated treatment well    Behavior During Therapy [facility] for tasks assessed/performed             Past Medical History:  Diagnosis Date   Brain aneurysm     Past Surgical History:  Procedure Laterality Date   IR 3D INDEPENDENT WKST  03/11/2021   IR ANGIO INTRA EXTRACRAN SEL COM CAROTID INNOMINATE BILAT MOD SED  03/11/2021   IR ANGIO VERTEBRAL SEL VERTEBRAL BILAT MOD SED  03/11/2021   RADIOLOGY WITH ANESTHESIA N/A 03/11/2021   Procedure: IR WITH ANESTHESIA - DIAGNOSTIC CEREBRAL ANGIOGRAM;  Surgeon: Lisbeth Renshaw, MD;  [facility];  Service: Radiology;  Laterality: N/A;   VENTRICULOPERITONEAL SHUNT      There were no vitals filed for this visit.   Subjective Assessment - 05/27/21 1317     Subjective  Pt denies anything new and denies pain.    Patient is accompanied by: Family member   spouse, [name]   Pertinent History CVA (brain aneurysm) 2004    Limitations no driving, impulsive    Currently in Pain? No/denies    Pain Score 0-No pain                          OT Treatments/Exercises (OP) - 05/27/21 1319       Cognitive Exercises   Problem Solving Complex problem solving with planning meal and shopping list for  dinner party. Pt req'd moderate cueing for getting started and undersatnding instrunctions. Pt req'd mod cues for problem solving how many people and amount to buy and price of steaks.    Attention Span Sustained Same Symbols on Constant Therapy level 5 with 87% accuracy and 47.04s response time    Attention Span Alternating Alternating Symbols on Constant Therapy level 3 - pt had to ready instructions each time screen changed. Pt did not finish d/t time but demonstrated increased attention and ability to follow directions this day      Hand Exercises   Other Hand Exercises level 4 with black spring for picking up 1 inch blocks with RUE. Downgraded to level 3 with black spring after approx 25% of blocks. Mod difficulty and moderate drops                      OT Short Term Goals - 05/27/21 1318       OT SHORT TERM GOAL #1   Title Pt will be independent with initial HEP    Time 4    Period Weeks    Status Achieved    Target Date 05/21/21      OT SHORT TERM GOAL #2  Title Pt will increase grip strength in RUE to 90 lbs or greater for increasing strength in R, dominant, hand.    Baseline R 81.5, L 111.5    Time 4    Period Weeks    Status On-going   72.5 lbs with RUE 05/27/21     OT SHORT TERM GOAL #3   Title Continue to assess vision and determine goals PRN    Time 4    Period Weeks    Status Achieved      OT SHORT TERM GOAL #4   Title Pt will perform simple warm meal prep and/or light housekeeping with supervision and good safety awareness    Time 4    Period Weeks    Status Achieved      OT SHORT TERM GOAL #5   Title Pt will perform environmental scanning with 90% accuracy in mod distracting environment    Baseline 12/14 in low distracting environment (86%)    Time 4    Period Weeks    Status Achieved   94% on 05/13/21              OT Long Term Goals - 04/23/21 1437       OT LONG TERM GOAL #1   Title Pt will be independent with updated HEP    Time 10     Period Weeks    Status New    Target Date 07/02/21      OT LONG TERM GOAL #2   Title Pt will increase grip strength by 15 lbs or greater in RUE for increasing functional strength in RUE, dominant, hand.    Baseline R 81.5, L 111.5    Time 10    Period Weeks    Status New      OT LONG TERM GOAL #3   Title Pt will perform physical and cognitive tasks simultaneously with 95% accuracy    Time 10    Period Weeks    Status New      OT LONG TERM GOAL #4   Title Pt will perform mod complex or follow instructions or recipe in cooking task with good safety awareness and with mod I.    Time 10    Period Weeks    Status New      OT LONG TERM GOAL #5   Title Pt and caregiver will verbalize understanding of return to work and return to driving recommendations    Time 10    Period Weeks    Status New                   Plan - 05/27/21 1411     Clinical Impression Statement Pt continues to progress and demonstrated improved alternating and sustained attention today.    OT Occupational Profile and History Problem Focused Assessment - Including review of records relating to presenting problem    Occupational performance deficits (Please refer to evaluation for details): IADL's;Work;Leisure    Body Structure / Function / Physical Skills Decreased knowledge of use of DME;Strength;GMC;Dexterity;Proprioception;UE functional use;IADL;Vision;Flexibility;FMC;Endurance    Cognitive Skills Problem Solve;Sequencing;Safety Awareness;Attention;Memory    Rehab Potential Good    Clinical Decision Making Limited treatment options, no task modification necessary    Comorbidities Affecting Occupational Performance: None    Modification or Assistance to Complete Evaluation  No modification of tasks or assist necessary to complete eval    OT Frequency 2x / week    OT Duration Other (comment)   16 visits  over 10 weeks for scheduling conflicts   OT Treatment/Interventions Self-care/ADL training;DME  and/or AE instruction;Therapeutic activities;Energy conservation;Patient/family education;Visual/perceptual remediation/compensation;Therapeutic exercise;Cognitive remediation/compensation;Neuromuscular education;Functional Mobility Training    Plan coninue to work on attention and alternating attention, problem solving    Consulted and Agree with Plan of Care Patient;Family member/caregiver    Family Member Consulted spouse, Water quality scientist             Patient will benefit from skilled therapeutic intervention in order to improve the following deficits and impairments:   Body Structure / Function / Physical Skills: Decreased knowledge of use of DME, Strength, GMC, Dexterity, Proprioception, UE functional use, IADL, Vision, Flexibility, FMC, Endurance Cognitive Skills: Problem Solve, Sequencing, Safety Awareness, Attention, Memory     Visit Diagnosis: Apraxia  Muscle weakness (generalized)  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 [place], [place], [address] Phone: [phone]   Fax:  [phone]  Name: Duanne Duchesne MRN: [id number] Date of Birth: 1981-02-11

## 2021-05-28 ENCOUNTER — Encounter: Payer: Self-pay | Admitting: Occupational Therapy

## 2021-05-28 ENCOUNTER — Ambulatory Visit
Admission: RE | Admit: 2021-05-28 | Discharge: 2021-05-28 | Disposition: A | Discharge status: Home / Self Care | Payer: 59 | Source: Ambulatory Visit | Attending: Radiation Oncology | Admitting: Radiation Oncology

## 2021-05-28 ENCOUNTER — Ambulatory Visit: Payer: 59 | Admitting: Occupational Therapy

## 2021-05-28 ENCOUNTER — Encounter: Payer: Self-pay | Admitting: General Practice

## 2021-05-28 ENCOUNTER — Ambulatory Visit: Payer: 59

## 2021-05-28 DIAGNOSIS — R41844 Frontal lobe and executive function deficit: Secondary | ICD-10-CM

## 2021-05-28 DIAGNOSIS — Z510 Encounter for antineoplastic radiation therapy: Secondary | ICD-10-CM | POA: Diagnosis not present

## 2021-05-28 DIAGNOSIS — M6281 Muscle weakness (generalized): Secondary | ICD-10-CM

## 2021-05-28 DIAGNOSIS — Q283 Other malformations of cerebral vessels: Secondary | ICD-10-CM

## 2021-05-28 DIAGNOSIS — R4701 Aphasia: Secondary | ICD-10-CM

## 2021-05-28 DIAGNOSIS — R41842 Visuospatial deficit: Secondary | ICD-10-CM

## 2021-05-28 DIAGNOSIS — R41840 Attention and concentration deficit: Secondary | ICD-10-CM

## 2021-05-28 DIAGNOSIS — I69151 Hemiplegia and hemiparesis following nontraumatic intracerebral hemorrhage affecting right dominant side: Secondary | ICD-10-CM

## 2021-05-28 DIAGNOSIS — R41841 Cognitive communication deficit: Secondary | ICD-10-CM

## 2021-05-28 MED ORDER — GADOBENATE DIMEGLUMINE 529 MG/ML IV SOLN
15.0000 mL | Freq: Once | INTRAVENOUS | Status: AC | PRN
  Administered 2021-05-28: 15 mL via INTRAVENOUS

## 2021-05-28 MED ORDER — GADOBENATE DIMEGLUMINE 529 MG/ML IV SOLN: 16.0000 mL | Freq: Once | INTRAVENOUS | Status: DC | PRN

## 2021-05-28 NOTE — Therapy (Signed)
[facility] [address] [place], [place], [address] Phone: [phone]   Fax:  [phone]  Occupational Therapy Treatment  Patient Details  Name: Casey Branch MRN: [id number] Date of Birth: 1981/01/01 Referring Provider (OT): Sula Soda   Encounter Date: 05/28/2021   OT End of Session - 05/28/21 1018     Visit Number 8    Number of Visits 17    Date for OT Re-Evaluation 07/02/21   10 weeks for allowing for scheduling difficulties   Authorization Type [employer]    Authorization Time Period VL: 30 PT\OT, 30 ST    Authorization - Number of Visits 30    OT Start Time 1017    OT Stop Time 1057    OT Time Calculation (min) 40 min    Activity Tolerance Patient tolerated treatment well    Behavior During Therapy [facility] for tasks assessed/performed             Past Medical History:  Diagnosis Date   Brain aneurysm     Past Surgical History:  Procedure Laterality Date   IR 3D INDEPENDENT WKST  03/11/2021   IR ANGIO INTRA EXTRACRAN SEL COM CAROTID INNOMINATE BILAT MOD SED  03/11/2021   IR ANGIO VERTEBRAL SEL VERTEBRAL BILAT MOD SED  03/11/2021   RADIOLOGY WITH ANESTHESIA N/A 03/11/2021   Procedure: IR WITH ANESTHESIA - DIAGNOSTIC CEREBRAL ANGIOGRAM;  Surgeon: Lisbeth Renshaw, MD;  [facility];  Service: Radiology;  Laterality: N/A;   VENTRICULOPERITONEAL SHUNT      There were no vitals filed for this visit.   Subjective Assessment - 05/28/21 1018     Subjective  " It was ok" bout ST    Patient is accompanied by: Family member   spouse, [name]   Pertinent History CVA (brain aneurysm) 2004    Limitations no driving, impulsive    Currently in Pain? No/denies    Pain Score 0-No pain               Sequencing common tasks - mod cues for continuing. Pt would write first step and then move onto another task. Pt was able to verbalize greater than putting pen to paper with sequencing tasks.    PVC  Pipe Tree - moderate cues for step by step getting started and then was able to complete  Fig 10 with 100% accuracy and good attention. Completed Fig 15 with increased time and with 80-90% accuracy (2 errors). Pt req max cues for identifying errors.   Tanagram Puzzles with min assistance getting started and able to solve and complete 2 beginning levels with 100% accuracy.              OT Short Term Goals - 05/27/21 1318       OT SHORT TERM GOAL #1   Title Pt will be independent with initial HEP    Time 4    Period Weeks    Status Achieved    Target Date 05/21/21      OT SHORT TERM GOAL #2   Title Pt will increase grip strength in RUE to 90 lbs or greater for increasing strength in R, dominant, hand.    Baseline R 81.5, L 111.5    Time 4    Period Weeks    Status On-going   72.5 lbs with RUE 05/27/21     OT SHORT TERM GOAL #3   Title Continue to assess vision and determine goals PRN    Time 4  Period Weeks    Status Achieved      OT SHORT TERM GOAL #4   Title Pt will perform simple warm meal prep and/or light housekeeping with supervision and good safety awareness    Time 4    Period Weeks    Status Achieved      OT SHORT TERM GOAL #5   Title Pt will perform environmental scanning with 90% accuracy in mod distracting environment    Baseline 12/14 in low distracting environment (86%)    Time 4    Period Weeks    Status Achieved   94% on 05/13/21              OT Long Term Goals - 04/23/21 1437       OT LONG TERM GOAL #1   Title Pt will be independent with updated HEP    Time 10    Period Weeks    Status New    Target Date 07/02/21      OT LONG TERM GOAL #2   Title Pt will increase grip strength by 15 lbs or greater in RUE for increasing functional strength in RUE, dominant, hand.    Baseline R 81.5, L 111.5    Time 10    Period Weeks    Status New      OT LONG TERM GOAL #3   Title Pt will perform physical and cognitive tasks simultaneously with  95% accuracy    Time 10    Period Weeks    Status New      OT LONG TERM GOAL #4   Title Pt will perform mod complex or follow instructions or recipe in cooking task with good safety awareness and with mod I.    Time 10    Period Weeks    Status New      OT LONG TERM GOAL #5   Title Pt and caregiver will verbalize understanding of return to work and return to driving recommendations    Time 10    Period Weeks    Status New                   Plan - 05/28/21 1019     Clinical Impression Statement Pt is making slow and steady progress towards goals. Pt continues to demonstrate deficits with attention and problem solving/sequencing.    OT Occupational Profile and History Problem Focused Assessment - Including review of records relating to presenting problem    Occupational performance deficits (Please refer to evaluation for details): IADL's;Work;Leisure    Body Structure / Function / Physical Skills Decreased knowledge of use of DME;Strength;GMC;Dexterity;Proprioception;UE functional use;IADL;Vision;Flexibility;FMC;Endurance    Cognitive Skills Problem Solve;Sequencing;Safety Awareness;Attention;Memory    Rehab Potential Good    Clinical Decision Making Limited treatment options, no task modification necessary    Comorbidities Affecting Occupational Performance: None    Modification or Assistance to Complete Evaluation  No modification of tasks or assist necessary to complete eval    OT Frequency 2x / week    OT Duration Other (comment)   16 visits over 10 weeks for scheduling conflicts   OT Treatment/Interventions Self-care/ADL training;DME and/or AE instruction;Therapeutic activities;Energy conservation;Patient/family education;Visual/perceptual remediation/compensation;Therapeutic exercise;Cognitive remediation/compensation;Neuromuscular education;Functional Mobility Training    Plan continue working towards sequencing tasks, and planning    Consulted and Agree with Plan of  Care Patient;Family member/caregiver    Family Member Consulted spouse, [name]             Patient will benefit  from skilled therapeutic intervention in order to improve the following deficits and impairments:   Body Structure / Function / Physical Skills: Decreased knowledge of use of DME, Strength, GMC, Dexterity, Proprioception, UE functional use, IADL, Vision, Flexibility, [facility], Endurance Cognitive Skills: Problem Solve, Sequencing, Safety Awareness, Attention, Memory     Visit Diagnosis: Visuospatial deficit  Muscle weakness (generalized)  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 [place], [place], [address] Phone: [phone]   Fax:  [phone]  Name: Malcolm Quast MRN: [id number] Date of Birth: [birth year]

## 2021-05-28 NOTE — Progress Notes (Signed)
[facility] Spiritual Care Note  Connected with Chevy briefly by phone as planned. He is very busy today with appointments and needs to rest his voice, so he plans to follow up by phone at a better time.   [address] Rush [place], [place], [facility] Pager [phone] Voicemail [phone]

## 2021-05-29 ENCOUNTER — Other Ambulatory Visit: Payer: Self-pay

## 2021-05-29 ENCOUNTER — Ambulatory Visit
Admission: RE | Admit: 2021-05-29 | Discharge: 2021-05-29 | Disposition: A | Discharge status: Home / Self Care | Payer: 59 | Source: Ambulatory Visit | Attending: Radiation Oncology | Admitting: Radiation Oncology

## 2021-05-29 VITALS — BP 124/83 | HR 71 | Temp 96.8°F | Resp 18 | Ht 72.5 in | Wt 174.4 lb

## 2021-05-29 DIAGNOSIS — Z510 Encounter for antineoplastic radiation therapy: Secondary | ICD-10-CM | POA: Diagnosis not present

## 2021-05-29 DIAGNOSIS — Q2730 Arteriovenous malformation, site unspecified: Secondary | ICD-10-CM

## 2021-05-29 DIAGNOSIS — Q282 Arteriovenous malformation of cerebral vessels: Secondary | ICD-10-CM

## 2021-05-29 LAB — BUN & CREATININE (CHCC)
BUN: 15 mg/dL (ref 6–20)
Creatinine: 1.2 mg/dL (ref 0.61–1.24)
GFR, Estimated: >60 mL/min (ref >60)

## 2021-05-29 NOTE — Patient Instructions (Signed)
It is better to write things down at this time due to you having more difficulty recalling things. A memory notebook or planner may work with sections calendar, to-do list, MD questions, journal, homework

## 2021-05-29 NOTE — Progress Notes (Signed)
Has armband been applied?  Yes.    Does patient have an allergy to IV contrast dye?: No.   Has patient ever received premedication for IV contrast dye?: No.   Does patient take metformin?: No.  If patient does take metformin when was the last dose: none  Date of lab work: May 29, 2021 BUN: 15 CR: 1.20  IV site: antecubital right, condition patent and no redness  Has IV site been added to flowsheet?  Yes.    BP 124/83 (BP Location: Left Arm, Patient Position: Sitting)   Pulse 71   Temp (!) 96.8 F (36 C) (Temporal)   Resp 18   Ht 6' 0.5" (1.842 m)   Wt 174 lb 6 oz (79.1 kg)   SpO2 99%   BMI 23.32 kg/m

## 2021-05-29 NOTE — Therapy (Signed)
[facility] [address] [place], [place], [address] Phone: [phone]   Fax:  [phone]  Speech Language Pathology Treatment  Patient Details  Name: Casey Branch MRN: [id number] Date of Birth: September 30, 1980 Referring Provider (SLP): Lauraine Rinne, [place]   Encounter Date: 05/28/2021   End of Session - 05/29/21 0032     Visit Number 7    Number of Visits 25    Date for SLP Re-Evaluation 07/21/21    Authorization Type 30 ST    SLP Start Time 0936    SLP Stop Time  1016    SLP Time Calculation (min) 40 min    Activity Tolerance Patient tolerated treatment well             Past Medical History:  Diagnosis Date   Brain aneurysm     Past Surgical History:  Procedure Laterality Date   IR 3D INDEPENDENT WKST  03/11/2021   IR ANGIO INTRA EXTRACRAN SEL COM CAROTID INNOMINATE BILAT MOD SED  03/11/2021   IR ANGIO VERTEBRAL SEL VERTEBRAL BILAT MOD SED  03/11/2021   RADIOLOGY WITH ANESTHESIA N/A 03/11/2021   Procedure: IR WITH ANESTHESIA - DIAGNOSTIC CEREBRAL ANGIOGRAM;  Surgeon: Consuella Lose, MD;  Location: [facility];  Service: Radiology;  Laterality: N/A;   VENTRICULOPERITONEAL SHUNT      There were no vitals filed for this visit.   Subjective Assessment - 05/28/21 0943     Subjective Pt arrives with better quality voice today than yesterday.    Patient is accompained by: Family member   [place] - wife   Currently in Pain? No/denies                   ADULT SLP TREATMENT - 05/29/21 0001       General Information   Behavior/Cognition Alert;Cooperative;Pleasant mood      Treatment Provided   Treatment provided Cognitive-Linquistic      Cognitive-Linquistic Treatment   Treatment focused on Cognition;Patient/family/caregiver education    Skilled Treatment Pt recalled some details aobut his homework given min-mod A usually. Pt expressed disappointment in his rehab taking such a "long time" - SLP  encouraged pt to think of positives like spending more time with wife and son at this time. Pt demonstrated decr'd awareness and insight into his deficits. Wife reports pt cont to have "some good days" and not so good days. SLP suggested pt use a memory notebook/planner with sections calendar, to-do list, MD questions, homework. SLP also suggested pt use his phone for recall. Pt homework was not recalled 20 minutes after it was given - pt stated he would recall it so SLP suggested he write it down eithe rin his phone or by some other means. Pt did not do so.      Assessment / Recommendations / Plan   Plan Continue with current plan of care      Progression Toward Goals   Progression toward goals Progressing toward goals              SLP Education - 05/29/21 0031     Education Details rehab taking more time than he would prefer good time to spend with family    Person(s) Educated Patient;Spouse    Methods Explanation    Comprehension Verbalized understanding;Need further instruction              SLP Short Term Goals - 05/27/21 1247       SLP SHORT TERM GOAL #1   Title  pt will use memory compensations for checking appointment schedule in 3 sessions with min A    Status Not Met    Target Date 05/23/21      SLP SHORT TERM GOAL #2   Title pt will demo knowledge of three non-physical deficits in 3 sessions    Status Not Met    Target Date 05/23/21      SLP SHORT TERM GOAL #3   Title pt will demonstrate sustained attention for 8 minutes to a functional  cognitive linguistic task in 3 sesisons    Baseline 05-08-21, 05-15-21, 05-27-21    Status Achieved    Target Date 05/23/21      SLP SHORT TERM GOAL #4   Title pt will successfully use compensations for anomia in 10 minute simple-mod complex conversation in 3 sessions    Status Partially Met    Target Date 05/23/21      SLP SHORT TERM GOAL #5   Title complete formal cognitive linguisitic teasting    Status Achieved    Target  Date 05/09/21              SLP Long Term Goals - 05/29/21 0034       SLP LONG TERM GOAL #1   Title pt will successfully use compensations for anomia in 10 minute mod-max complex conversation in 3 sessions    Time 8    Period Weeks    Status On-going    Target Date 06/20/21      SLP LONG TERM GOAL #2   Title pt will demo functional alternating attention between two mod complex linguistic tasks in 3 sessions    Time 12    Period Weeks    Status On-going    Target Date 07/18/21      SLP LONG TERM GOAL #3   Title pt will use memory compensations to accomplish things in his daily schedule (to-do list, etc) for/between 3 sessions    Time 12    Period Weeks    Status On-going    Target Date 07/18/21      SLP LONG TERM GOAL #4   Title pt will successfully use compensations for anomia in 10 minute max complex conversation in 3 sessions    Time 12    Period Weeks    Status On-going    Target Date 07/18/21              Plan - 05/29/21 0032     Clinical Impression Statement Casey Branch presents today with s/sx of a cognitive linguistic deficits in the areas of attention, awareness, memory, and aphasia. [place] at this time has little awareness and insight into his defcit areas. 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    Speech Therapy Frequency 2x / week    Duration 12 weeks    Treatment/Interventions Environmental controls;Functional tasks;SLP instruction and feedback;Cueing hierarchy;Language facilitation;Cognitive reorganization;Compensatory strategies;Internal/external aids;Patient/family education    Potential to Achieve Goals Good    SLP Home Exercise Plan provided    Consulted and Agree with Plan of Care Patient;Family member/caregiver    Family Member Consulted wife, Caryl Comes             Patient will benefit from  skilled therapeutic intervention in order to improve the following deficits and impairments:   Cognitive communication deficit  Aphasia    Problem  List Patient Active Problem List   Diagnosis Date Noted   AVM (arteriovenous malformation) brain 05/20/2021   Brain aneurysm 04/21/2021   ICH (intracerebral hemorrhage) ([facility]) 03/19/2021   Intraventricular hemorrhage ([facility]) 03/08/2021    [place] ,[place], [place]  05/29/2021, 12:35 AM  [facility] [address] [place], [place], [address] Phone: [phone]   Fax:  [phone]   Name: Johann Santone MRN: [id number] Date of Birth: [birth year]

## 2021-06-01 NOTE — Progress Notes (Signed)
  Radiation Oncology         [phone] ________________________________  Name: Casey Branch MRN: [id number]  Date: 05/29/2021  DOB: 1980-12-10  SIMULATION AND TREATMENT PLANNING NOTE    ICD-10-CM   1. 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  Identity was confirmed.  All relevant records and images related to the planned course of therapy were reviewed.  The patient freely provided informed written consent to proceed with treatment after reviewing the details related to the planned course of therapy. The consent form was witnessed and verified by the simulation staff. Intravenous access was established for contrast administration. Then, the patient was set-up in a stable reproducible supine position for radiation therapy.  A relocatable thermoplastic stereotactic head frame was fabricated for precise immobilization.  CT images were obtained.  Surface markings were placed.  The CT images were loaded into the planning software and fused with the patient's targeting MRI scan.  Then the target and avoidance structures were contoured.  Treatment planning then occurred.  The radiation prescription was entered and confirmed.  I have requested 3D planning  I have requested a DVH of the following structures: Brain stem, brain, left eye, right eye, lenses, optic chiasm, target volumes, uninvolved brain, and normal tissue.    SPECIAL TREATMENT PROCEDURE:  The planned course of therapy using radiation constitutes a special treatment procedure. Special care is required in the management of this patient for the following reasons. This treatment constitutes a Special Treatment Procedure for the following reason: High dose per fraction requiring special monitoring for increased toxicities of treatment including daily imaging.  The special nature  of the planned course of radiotherapy will require increased physician supervision and oversight to ensure patient's safety with optimal treatment outcomes.  This requires extended time and effort.  PLAN:  The patient will receive 15 Gy in 1 fraction.  ________________________________  Artist Pais Kathrynn Running, M.D.

## 2021-06-03 ENCOUNTER — Ambulatory Visit: Payer: 59

## 2021-06-03 ENCOUNTER — Other Ambulatory Visit: Payer: Self-pay

## 2021-06-03 ENCOUNTER — Encounter: Payer: Self-pay | Admitting: Occupational Therapy

## 2021-06-03 ENCOUNTER — Ambulatory Visit: Payer: 59 | Admitting: Occupational Therapy

## 2021-06-03 DIAGNOSIS — R41841 Cognitive communication deficit: Secondary | ICD-10-CM

## 2021-06-03 DIAGNOSIS — R41842 Visuospatial deficit: Secondary | ICD-10-CM

## 2021-06-03 DIAGNOSIS — Z510 Encounter for antineoplastic radiation therapy: Secondary | ICD-10-CM | POA: Diagnosis not present

## 2021-06-03 DIAGNOSIS — M6281 Muscle weakness (generalized): Secondary | ICD-10-CM

## 2021-06-03 DIAGNOSIS — R41844 Frontal lobe and executive function deficit: Secondary | ICD-10-CM

## 2021-06-03 DIAGNOSIS — I69151 Hemiplegia and hemiparesis following nontraumatic intracerebral hemorrhage affecting right dominant side: Secondary | ICD-10-CM

## 2021-06-03 DIAGNOSIS — R4701 Aphasia: Secondary | ICD-10-CM

## 2021-06-03 DIAGNOSIS — R482 Apraxia: Secondary | ICD-10-CM

## 2021-06-03 DIAGNOSIS — R41840 Attention and concentration deficit: Secondary | ICD-10-CM

## 2021-06-03 NOTE — Therapy (Signed)
[facility] [address] [place], [place], [address] Phone: [phone]   Fax:  [phone]  Speech Language Pathology Treatment  Patient Details  Name: Casey Branch MRN: [id number] Date of Birth: [birth year] Referring Provider (SLP): Lauraine Rinne, [place]   Encounter Date: 06/03/2021   End of Session - 06/03/21 1253     Visit Number 8    Number of Visits 25    Date for SLP Re-Evaluation 07/21/21    Authorization Type 30 ST    SLP Start Time 0937   arrived 0936   SLP Stop Time  1015    SLP Time Calculation (min) 38 min    Activity Tolerance Patient tolerated treatment well             Past Medical History:  Diagnosis Date   Brain aneurysm     Past Surgical History:  Procedure Laterality Date   IR 3D INDEPENDENT WKST  03/11/2021   IR ANGIO INTRA EXTRACRAN SEL COM CAROTID INNOMINATE BILAT MOD SED  03/11/2021   IR ANGIO VERTEBRAL SEL VERTEBRAL BILAT MOD SED  03/11/2021   RADIOLOGY WITH ANESTHESIA N/A 03/11/2021   Procedure: IR WITH ANESTHESIA - DIAGNOSTIC CEREBRAL ANGIOGRAM;  Surgeon: Consuella Lose, MD;  Location: [facility];  Service: Radiology;  Laterality: N/A;   VENTRICULOPERITONEAL SHUNT      There were no vitals filed for this visit.   Subjective Assessment - 06/03/21 0942     Subjective "My appointments are being kept on the refrigerator."    Patient is accompained by: Family member   Casey Branch -wife   Currently in Pain? No/denies                   ADULT SLP TREATMENT - 06/03/21 0945       General Information   Behavior/Cognition Alert;Cooperative;Pleasant mood;Other (comment)   flat affect, tearful     Treatment Provided   Treatment provided Cognitive-Linquistic      Cognitive-Linquistic Treatment   Treatment focused on Cognition;Patient/family/caregiver education    Skilled Treatment Pt did not bring a binder in with him; [place] had to remind pt this AM about specific time of his  appointment. SLP talked with pt and wife about pt tracking these types of things in his phone - pt demonstrated decr'd awareness of/insight into deficits. SLP highlighted that compensations for memory are a "test" for seeing if pt has equivalent skills to return to work. Pt became tearful in session about "it's just one thing after the other with me." SLP suggested pt talk with family, friends, Education officer, museum at cancer [facility]. Pt's homework was given to SLP at end of session today, and was not corrected with all necessary corrections.      Assessment / Recommendations / Plan   Plan Continue with current plan of care      Progression Toward Goals   Progression toward goals Not progressing toward goals (comment)   insight/awareness continues decr'd, ? depression             SLP Education - 06/03/21 1252     Education Details may need to talk to someone about his emotions, deficit areas, need to use compensations    Person(s) Educated Spouse;Patient    Methods Explanation    Comprehension Verbalized understanding              SLP Short Term Goals - 05/27/21 1247       SLP SHORT TERM GOAL #1   Title pt will  use memory compensations for checking appointment schedule in 3 sessions with min A    Status Not Met    Target Date 05/23/21      SLP SHORT TERM GOAL #2   Title pt will demo knowledge of three non-physical deficits in 3 sessions    Status Not Met    Target Date 05/23/21      SLP SHORT TERM GOAL #3   Title pt will demonstrate sustained attention for 8 minutes to a functional  cognitive linguistic task in 3 sesisons    Baseline 05-08-21, 05-15-21, 05-27-21    Status Achieved    Target Date 05/23/21      SLP SHORT TERM GOAL #4   Title pt will successfully use compensations for anomia in 10 minute simple-mod complex conversation in 3 sessions    Status Partially Met    Target Date 05/23/21      SLP SHORT TERM GOAL #5   Title complete formal cognitive linguisitic teasting     Status Achieved    Target Date 05/09/21              SLP Long Term Goals - 06/03/21 1256       SLP LONG TERM GOAL #1   Title pt will successfully use compensations for anomia in 10 minute mod-max complex conversation in 3 sessions    Time 8    Period Weeks    Status On-going    Target Date 06/20/21      SLP LONG TERM GOAL #2   Title pt will demo functional alternating attention between two mod complex linguistic tasks in 3 sessions    Time 12    Period Weeks    Status On-going    Target Date 07/18/21      SLP LONG TERM GOAL #3   Title pt will use memory compensations to accomplish things in his daily schedule (to-do list, etc) for/between 3 sessions    Time 12    Period Weeks    Status On-going    Target Date 07/18/21      SLP LONG TERM GOAL #4   Title pt will successfully use compensations for anomia in 10 minute max complex conversation in 3 sessions    Time 12    Period Weeks    Status On-going    Target Date 07/18/21              Plan - 06/03/21 1254     Clinical Impression Statement Casey Branch presents today with s/sx of a cognitive linguistic deficits in the areas of attention, awareness, memory, and aphasia. Pt's verbal language today was functional. [name] at this time continues with decr'd awareness and insight into his defcit areas. 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    Speech Therapy Frequency 2x / week    Duration 12 weeks    Treatment/Interventions Environmental controls;Functional tasks;SLP instruction and feedback;Cueing hierarchy;Language facilitation;Cognitive reorganization;Compensatory strategies;Internal/external aids;Patient/family education    Potential to Achieve Goals Good    SLP Home Exercise Plan provided    Consulted and Agree with Plan of Care Patient;Family member/caregiver     Family Member Consulted wife, Casey Branch             Patient will benefit from skilled therapeutic intervention in order to improve the following deficits and impairments:   Cognitive communication deficit  Aphasia    Problem List Patient Active Problem List   Diagnosis Date Noted   AVM (arteriovenous malformation) brain 05/20/2021   Brain aneurysm 04/21/2021   ICH (intracerebral hemorrhage) ([facility]) 03/19/2021   Intraventricular hemorrhage ([facility]) 03/08/2021    [place] ,[place], [place]  06/03/2021, 12:58 PM  [facility] [address] [place], [place], [address] Phone: [phone]   Fax:  [phone]   Name: Torrey Ballinas MRN: [id number] Date of Birth: 12-31-1980

## 2021-06-03 NOTE — Therapy (Signed)
[facility] [address] [place], [place], [address] Phone: [phone]   Fax:  [phone]  Occupational Therapy Treatment  Patient Details  Name: Casey Branch MRN: [id number] Date of Birth: [birth year] Referring Provider (OT): Sula Soda   Encounter Date: 06/03/2021   OT End of Session - 06/03/21 1018     Visit Number 9    Number of Visits 17    Date for OT Re-Evaluation 07/02/21   10 weeks for allowing for scheduling difficulties   Authorization Type [employer]    Authorization Time Period VL: 30 PT\OT, 30 ST    Authorization - Number of Visits 30    OT Start Time 1017    OT Stop Time 1057    OT Time Calculation (min) 40 min    Activity Tolerance Patient tolerated treatment well    Behavior During Therapy [facility] for tasks assessed/performed             Past Medical History:  Diagnosis Date   Brain aneurysm     Past Surgical History:  Procedure Laterality Date   IR 3D INDEPENDENT WKST  03/11/2021   IR ANGIO INTRA EXTRACRAN SEL COM CAROTID INNOMINATE BILAT MOD SED  03/11/2021   IR ANGIO VERTEBRAL SEL VERTEBRAL BILAT MOD SED  03/11/2021   RADIOLOGY WITH ANESTHESIA N/A 03/11/2021   Procedure: IR WITH ANESTHESIA - DIAGNOSTIC CEREBRAL ANGIOGRAM;  Surgeon: Lisbeth Renshaw, MD;  [facility];  Service: Radiology;  Laterality: N/A;   VENTRICULOPERITONEAL SHUNT      There were no vitals filed for this visit.   Subjective Assessment - 06/03/21 1018     Subjective  "good, good" "nothing much other than the rain"    Patient is accompanied by: Family member   spouse, [name]   Pertinent History CVA (brain aneurysm) 2004    Limitations no driving, impulsive    Currently in Pain? No/denies    Pain Score 0-No pain             Small Pegboard copied pattern with 100% accuracy and good attention to task.  Constant Therapy Sequencing Common Tasks level 1 with 90% accuracy and 55.95s response time. Read  a Map level 1 with 90% accuracy and 72.23s response time.                       OT Short Term Goals - 05/27/21 1318       OT SHORT TERM GOAL #1   Title Pt will be independent with initial HEP    Time 4    Period Weeks    Status Achieved    Target Date 05/21/21      OT SHORT TERM GOAL #2   Title Pt will increase grip strength in RUE to 90 lbs or greater for increasing strength in R, dominant, hand.    Baseline R 81.5, L 111.5    Time 4    Period Weeks    Status On-going   72.5 lbs with RUE 05/27/21     OT SHORT TERM GOAL #3   Title Continue to assess vision and determine goals PRN    Time 4    Period Weeks    Status Achieved      OT SHORT TERM GOAL #4   Title Pt will perform simple warm meal prep and/or light housekeeping with supervision and good safety awareness    Time 4    Period Weeks    Status Achieved  OT SHORT TERM GOAL #5   Title Pt will perform environmental scanning with 90% accuracy in mod distracting environment    Baseline 12/14 in low distracting environment (86%)    Time 4    Period Weeks    Status Achieved   94% on 05/13/21              OT Long Term Goals - 06/03/21 1024       OT LONG TERM GOAL #1   Title Pt will be independent with updated HEP    Time 10    Period Weeks    Status On-going      OT LONG TERM GOAL #2   Title Pt will increase grip strength by 15 lbs or greater in RUE for increasing functional strength in RUE, dominant, hand.    Baseline R 81.5, L 111.5    Time 10    Period Weeks    Status New      OT LONG TERM GOAL #3   Title Pt will perform physical and cognitive tasks simultaneously with 95% accuracy    Time 10    Period Weeks    Status New      OT LONG TERM GOAL #4   Title Pt will perform mod complex or follow instructions or recipe in cooking task with good safety awareness and with mod I.    Time 10    Period Weeks    Status On-going      OT LONG TERM GOAL #5   Title Pt and caregiver will  verbalize understanding of return to work and return to driving recommendations    Time 10    Period Weeks    Status Deferred                   Plan - 06/03/21 1037     Clinical Impression Statement Pt demonstrated increase in attention today however slowing progressing towards goals. Work and driving goal deferred at this time.    OT Occupational Profile and History Problem Focused Assessment - Including review of records relating to presenting problem    Occupational performance deficits (Please refer to evaluation for details): IADL's;Work;Leisure    Body Structure / Function / Physical Skills Decreased knowledge of use of DME;Strength;GMC;Dexterity;Proprioception;UE functional use;IADL;Vision;Flexibility;FMC;Endurance    Cognitive Skills Problem Solve;Sequencing;Safety Awareness;Attention;Memory    Rehab Potential Good    Clinical Decision Making Limited treatment options, no task modification necessary    Comorbidities Affecting Occupational Performance: None    Modification or Assistance to Complete Evaluation  No modification of tasks or assist necessary to complete eval    OT Frequency 2x / week    OT Duration Other (comment)   16 visits over 10 weeks for scheduling conflicts   OT Treatment/Interventions Self-care/ADL training;DME and/or AE instruction;Therapeutic activities;Energy conservation;Patient/family education;Visual/perceptual remediation/compensation;Therapeutic exercise;Cognitive remediation/compensation;Neuromuscular education;Functional Mobility Training    Plan continue working towards sequencing tasks, and planning, look into scheduling more visits v dc at end of visits    Consulted and Agree with Plan of Care Patient;Family member/caregiver    Family Member Consulted spouse, [name]             Patient will benefit from skilled therapeutic intervention in order to improve the following deficits and impairments:   Body Structure / Function / Physical  Skills: Decreased knowledge of use of DME, Strength, GMC, Dexterity, Proprioception, UE functional use, IADL, Vision, Flexibility, [facility], Endurance Cognitive Skills: Problem Solve, Sequencing, Safety Awareness, Attention, Memory  Visit Diagnosis: Muscle weakness (generalized)  Visuospatial deficit  Apraxia  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 [place], [place], [address] Phone: [phone]   Fax:  [phone]  Name: Casey Branch MRN: [id number] Date of Birth: 1981/02/16

## 2021-06-05 ENCOUNTER — Encounter: Payer: Self-pay | Admitting: Occupational Therapy

## 2021-06-05 ENCOUNTER — Other Ambulatory Visit: Payer: Self-pay

## 2021-06-05 ENCOUNTER — Ambulatory Visit
Admission: RE | Admit: 2021-06-05 | Discharge: 2021-06-05 | Disposition: A | Discharge status: Home / Self Care | Payer: 59 | Source: Ambulatory Visit | Attending: Radiation Oncology | Admitting: Radiation Oncology

## 2021-06-05 ENCOUNTER — Ambulatory Visit: Payer: 59

## 2021-06-05 ENCOUNTER — Encounter: Payer: Self-pay | Admitting: Radiation Oncology

## 2021-06-05 ENCOUNTER — Ambulatory Visit: Payer: 59 | Admitting: Occupational Therapy

## 2021-06-05 VITALS — BP 135/89 | HR 58 | Temp 97.6°F | Resp 20

## 2021-06-05 DIAGNOSIS — R41840 Attention and concentration deficit: Secondary | ICD-10-CM

## 2021-06-05 DIAGNOSIS — R41841 Cognitive communication deficit: Secondary | ICD-10-CM

## 2021-06-05 DIAGNOSIS — R482 Apraxia: Secondary | ICD-10-CM

## 2021-06-05 DIAGNOSIS — R41842 Visuospatial deficit: Secondary | ICD-10-CM

## 2021-06-05 DIAGNOSIS — R41844 Frontal lobe and executive function deficit: Secondary | ICD-10-CM

## 2021-06-05 DIAGNOSIS — M6281 Muscle weakness (generalized): Secondary | ICD-10-CM

## 2021-06-05 DIAGNOSIS — Q282 Arteriovenous malformation of cerebral vessels: Secondary | ICD-10-CM

## 2021-06-05 DIAGNOSIS — I69151 Hemiplegia and hemiparesis following nontraumatic intracerebral hemorrhage affecting right dominant side: Secondary | ICD-10-CM

## 2021-06-05 DIAGNOSIS — Z510 Encounter for antineoplastic radiation therapy: Secondary | ICD-10-CM | POA: Diagnosis not present

## 2021-06-05 NOTE — Therapy (Signed)
[facility] [address] [place], [place], [address] Phone: [phone]   Fax:  [phone]  Occupational Therapy Treatment  Patient Details  Name: Casey Branch MRN: [id number] Date of Birth: 11/11/1980 Referring Provider (OT): Sula Soda   Encounter Date: 06/05/2021   OT End of Session - 06/05/21 1147     Visit Number 10    Number of Visits 17    Date for OT Re-Evaluation 07/02/21   10 weeks for allowing for scheduling difficulties   Authorization Type [employer]    Authorization Time Period VL: 30 PT\OT, 30 ST    Authorization - Number of Visits 30    OT Start Time 1145    OT Stop Time 1228    OT Time Calculation (min) 43 min    Activity Tolerance Patient tolerated treatment well    Behavior During Therapy [facility] for tasks assessed/performed             Past Medical History:  Diagnosis Date   Brain aneurysm     Past Surgical History:  Procedure Laterality Date   IR 3D INDEPENDENT WKST  03/11/2021   IR ANGIO INTRA EXTRACRAN SEL COM CAROTID INNOMINATE BILAT MOD SED  03/11/2021   IR ANGIO VERTEBRAL SEL VERTEBRAL BILAT MOD SED  03/11/2021   RADIOLOGY WITH ANESTHESIA N/A 03/11/2021   Procedure: IR WITH ANESTHESIA - DIAGNOSTIC CEREBRAL ANGIOGRAM;  Surgeon: Lisbeth Renshaw, MD;  Location: [facility];  Service: Radiology;  Laterality: N/A;   VENTRICULOPERITONEAL SHUNT      There were no vitals filed for this visit.   Subjective Assessment - 06/05/21 1146     Subjective  "nothing much"    Patient is accompanied by: Family member   spouse, [name]   Pertinent History CVA (brain aneurysm) 2004    Limitations no driving, impulsive    Currently in Pain? Yes    Pain Score 4     Pain Location Hand    Pain Orientation Right    Pain Descriptors / Indicators Sore    Pain Type Acute pain    Pain Onset In the past 7 days    Pain Frequency Intermittent    Aggravating Factors  soreness from using putty               Hand Gripper: with RUE on level 3 with black spring. Pt picked up 1 inch blocks with gripper with min drops and min difficulty.  Sequence Work Tasks with mod cues for pencil paper tasks. Pt required cues for organizing into interior vs exterior and steps and equipment/products needed.   Logic Links level 17 and 18 with min cues and increased time.   Rush Hour beginner levels with increased time and min verbal/visual cues. Pt with better attention for sequencing and problem solving.   Constant Therapy Alternating Symbols level 5 - did not finish or get score d/t time. Pt required increased time but demonstrated increased attention during task.                   OT Short Term Goals - 05/27/21 1318       OT SHORT TERM GOAL #1   Title Pt will be independent with initial HEP    Time 4    Period Weeks    Status Achieved    Target Date 05/21/21      OT SHORT TERM GOAL #2   Title Pt will increase grip strength in RUE to 90 lbs or greater  for increasing strength in R, dominant, hand.    Baseline R 81.5, L 111.5    Time 4    Period Weeks    Status On-going   72.5 lbs with RUE 05/27/21     OT SHORT TERM GOAL #3   Title Continue to assess vision and determine goals PRN    Time 4    Period Weeks    Status Achieved      OT SHORT TERM GOAL #4   Title Pt will perform simple warm meal prep and/or light housekeeping with supervision and good safety awareness    Time 4    Period Weeks    Status Achieved      OT SHORT TERM GOAL #5   Title Pt will perform environmental scanning with 90% accuracy in mod distracting environment    Baseline 12/14 in low distracting environment (86%)    Time 4    Period Weeks    Status Achieved   94% on 05/13/21              OT Long Term Goals - 06/03/21 1024       OT LONG TERM GOAL #1   Title Pt will be independent with updated HEP    Time 10    Period Weeks    Status On-going      OT LONG TERM GOAL #2   Title Pt will  increase grip strength by 15 lbs or greater in RUE for increasing functional strength in RUE, dominant, hand.    Baseline R 81.5, L 111.5    Time 10    Period Weeks    Status New      OT LONG TERM GOAL #3   Title Pt will perform physical and cognitive tasks simultaneously with 95% accuracy    Time 10    Period Weeks    Status New      OT LONG TERM GOAL #4   Title Pt will perform mod complex or follow instructions or recipe in cooking task with good safety awareness and with mod I.    Time 10    Period Weeks    Status On-going      OT LONG TERM GOAL #5   Title Pt and caregiver will verbalize understanding of return to work and return to driving recommendations    Time 10    Period Weeks    Status Deferred                   Plan - 06/05/21 1322     Clinical Impression Statement Pt demonstrating better attention today but continues to demonstrate move difficulty with paper pencil cognitive tasks vs. hands on or functional.    OT Occupational Profile and History Problem Focused Assessment - Including review of records relating to presenting problem    Occupational performance deficits (Please refer to evaluation for details): IADL's;Work;Leisure    Body Structure / Function / Physical Skills Decreased knowledge of use of DME;Strength;GMC;Dexterity;Proprioception;UE functional use;IADL;Vision;Flexibility;FMC;Endurance    Cognitive Skills Problem Solve;Sequencing;Safety Awareness;Attention;Memory    Rehab Potential Good    Clinical Decision Making Limited treatment options, no task modification necessary    Comorbidities Affecting Occupational Performance: None    Modification or Assistance to Complete Evaluation  No modification of tasks or assist necessary to complete eval    OT Frequency 2x / week    OT Duration Other (comment)   16 visits over 10 weeks for scheduling conflicts   OT Treatment/Interventions  Self-care/ADL training;DME and/or AE instruction;Therapeutic  activities;Energy conservation;Patient/family education;Visual/perceptual remediation/compensation;Therapeutic exercise;Cognitive remediation/compensation;Neuromuscular education;Functional Mobility Training    Plan continue working towards sequencing tasks, and planning, look into scheduling more visits v dc at end of visits    Consulted and Agree with Plan of Care Patient;Family member/caregiver    Family Member Consulted spouse, Water quality scientist             Patient will benefit from skilled therapeutic intervention in order to improve the following deficits and impairments:   Body Structure / Function / Physical Skills: Decreased knowledge of use of DME, Strength, GMC, Dexterity, Proprioception, UE functional use, IADL, Vision, Flexibility, FMC, Endurance Cognitive Skills: Problem Solve, Sequencing, Safety Awareness, Attention, Memory     Visit Diagnosis: Attention and concentration deficit  Muscle weakness (generalized)  Visuospatial deficit  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

## 2021-06-05 NOTE — Therapy (Signed)
[facility] [address] [place], [place], [address] Phone: [phone]   Fax:  [phone]  Speech Language Pathology Treatment  Patient Details  Name: Casey Branch MRN: [id number] Date of Birth: [birth year] Referring Provider (SLP): Lauraine Rinne, [place]   Encounter Date: 06/05/2021   End of Session - 06/05/21 1303     Visit Number 9    Number of Visits 25    Date for SLP Re-Evaluation 07/21/21    Authorization Type 30 ST    SLP Start Time 6384    SLP Stop Time  1315    SLP Time Calculation (min) 45 min    Activity Tolerance Patient tolerated treatment well             Past Medical History:  Diagnosis Date   Brain aneurysm     Past Surgical History:  Procedure Laterality Date   IR 3D INDEPENDENT WKST  03/11/2021   IR ANGIO INTRA EXTRACRAN SEL COM CAROTID INNOMINATE BILAT MOD SED  03/11/2021   IR ANGIO VERTEBRAL SEL VERTEBRAL BILAT MOD SED  03/11/2021   RADIOLOGY WITH ANESTHESIA N/A 03/11/2021   Procedure: IR WITH ANESTHESIA - DIAGNOSTIC CEREBRAL ANGIOGRAM;  Surgeon: Consuella Lose, MD;  Location: [facility];  Service: Radiology;  Laterality: N/A;   VENTRICULOPERITONEAL SHUNT      There were no vitals filed for this visit.   Subjective Assessment - 06/05/21 1233     Subjective "I went back to visit my job"    Patient is accompained by: Family member   wife, [place]   Currently in Pain? Yes    Pain Score 4     Pain Location Hand                   ADULT SLP TREATMENT - 06/05/21 1438       General Information   Behavior/Cognition Alert;Cooperative;Pleasant mood      Treatment Provided   Treatment provided Cognitive-Linquistic      Cognitive-Linquistic Treatment   Treatment focused on Cognition;Patient/family/caregiver education    Skilled Treatment Pt was more verbose this session compared to previous sessions with this SLP. Pt recalled 6 events for today with minimal prompting. Pt  reports he been keeping up with appointments via calendar on fridge without error. [name] suggested use of alarms/timers to alert patient when family will arrive to bring him to therapy. Pt continued to report his desire to return to work. SLP engaged patient in functional math task with calculations, with extended time and repetitions required to comprehend written language. Pt answered questions with 50% accuracy with usual cues required to aid comprehension and sequence appropriate steps to calculate. SLP re-iterated the new overt challenges of this task compared to reported baseline. Pt verbalized understanding.      Assessment / Recommendations / Plan   Plan Continue with current plan of care      Progression Toward Goals   Progression toward goals Not progressing toward goals (comment)   insight into deficits             SLP Education - 06/05/21 1302     Education Details implications of returning to work, alarms and timers for new ride to therapy    Person(s) Educated Patient;Spouse    Methods Explanation;Demonstration;Handout;Verbal cues    Comprehension Verbalized understanding;Returned demonstration;Verbal cues required;Need further instruction              SLP Short Term Goals - 05/27/21 1247  SLP SHORT TERM GOAL #1   Title pt will use memory compensations for checking appointment schedule in 3 sessions with min A    Status Not Met    Target Date 05/23/21      SLP SHORT TERM GOAL #2   Title pt will demo knowledge of three non-physical deficits in 3 sessions    Status Not Met    Target Date 05/23/21      SLP SHORT TERM GOAL #3   Title pt will demonstrate sustained attention for 8 minutes to a functional  cognitive linguistic task in 3 sesisons    Baseline 05-08-21, 05-15-21, 05-27-21    Status Achieved    Target Date 05/23/21      SLP SHORT TERM GOAL #4   Title pt will successfully use compensations for anomia in 10 minute simple-mod complex conversation  in 3 sessions    Status Partially Met    Target Date 05/23/21      SLP SHORT TERM GOAL #5   Title complete formal cognitive linguisitic teasting    Status Achieved    Target Date 05/09/21              SLP Long Term Goals - 06/05/21 1444       SLP LONG TERM GOAL #1   Title pt will successfully use compensations for anomia in 10 minute mod-max complex conversation in 3 sessions    Time 8    Period Weeks    Status On-going    Target Date 06/20/21      SLP LONG TERM GOAL #2   Title pt will demo functional alternating attention between two mod complex linguistic tasks in 3 sessions    Time 12    Period Weeks    Status On-going    Target Date 07/18/21      SLP LONG TERM GOAL #3   Title pt will use memory compensations to accomplish things in his daily schedule (to-do list, etc) for/between 3 sessions    Time 12    Period Weeks    Status On-going    Target Date 07/18/21      SLP LONG TERM GOAL #4   Title pt will successfully use compensations for anomia in 10 minute max complex conversation in 3 sessions    Time 12    Period Weeks    Status On-going    Target Date 07/18/21              Plan - 06/05/21 1444     Clinical Impression Statement Casey Branch presents today with s/sx of a cognitive linguistic deficits in the areas of attention, awareness, memory, and aphasia. Pt's verbal language today was functional. Casey Branch at this time continues with decr'd awareness and insight into his defcit areas. 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    Speech Therapy Frequency 2x / week    Duration 12 weeks    Treatment/Interventions Environmental controls;Functional tasks;SLP instruction and feedback;Cueing hierarchy;Language facilitation;Cognitive reorganization;Compensatory strategies;Internal/external aids;Patient/family  education    Potential to Achieve Goals Good    SLP Home Exercise Plan provided    Consulted and Agree with Plan of Care Patient;Family member/caregiver    Family Member Consulted wife, Caryl Comes             Patient will benefit from skilled therapeutic intervention in order to improve the  following deficits and impairments:   Cognitive communication deficit    Problem List Patient Active Problem List   Diagnosis Date Noted   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

## 2021-06-05 NOTE — Progress Notes (Signed)
Patient rested with [place] for 30 minutes following his SRS treatment.  Patient denies headache, dizziness, nausea, diplopia or ringing in the ears. Denies fatigue. Patient without complaints. Understands to avoid strenuous activity for the next 24 hours and call [phone] with needs. BP 135/89 (BP Location: Right Arm, Patient Position: Sitting, Cuff Size: Normal)   Pulse (!) 58   Temp 97.6 F (36.4 C)   Resp 20   SpO2 100%

## 2021-06-06 ENCOUNTER — Emergency Department (HOSPITAL_COMMUNITY): Payer: 59

## 2021-06-06 ENCOUNTER — Emergency Department (HOSPITAL_COMMUNITY)
Admission: EM | Admit: 2021-06-06 | Discharge: 2021-06-06 | Disposition: A | Discharge status: Home / Self Care | Payer: 59 | Attending: Emergency Medicine | Admitting: Emergency Medicine

## 2021-06-06 ENCOUNTER — Telehealth: Payer: Self-pay

## 2021-06-06 DIAGNOSIS — R561 Post traumatic seizures: Secondary | ICD-10-CM | POA: Insufficient documentation

## 2021-06-06 DIAGNOSIS — Z87891 Personal history of nicotine dependence: Secondary | ICD-10-CM | POA: Insufficient documentation

## 2021-06-06 DIAGNOSIS — I639 Cerebral infarction, unspecified: Secondary | ICD-10-CM | POA: Diagnosis not present

## 2021-06-06 DIAGNOSIS — R739 Hyperglycemia, unspecified: Secondary | ICD-10-CM | POA: Insufficient documentation

## 2021-06-06 DIAGNOSIS — R1110 Vomiting, unspecified: Secondary | ICD-10-CM | POA: Diagnosis not present

## 2021-06-06 DIAGNOSIS — R569 Unspecified convulsions: Secondary | ICD-10-CM | POA: Diagnosis present

## 2021-06-06 DIAGNOSIS — R519 Headache, unspecified: Secondary | ICD-10-CM | POA: Diagnosis not present

## 2021-06-06 LAB — CBC
HCT: 42.4 % (ref 39.0–52.0)
Hemoglobin: 14.6 g/dL (ref 13.0–17.0)
MCH: 31.3 pg (ref 26.0–34.0)
MCHC: 34.4 g/dL (ref 30.0–36.0)
MCV: 90.8 fL (ref 80.0–100.0)
Platelets: 206 10*3/uL (ref 150–400)
RBC: 4.67 MIL/uL (ref 4.22–5.81)
RDW: 12.5 % (ref 11.5–15.5)
WBC: 7.3 10*3/uL (ref 4.0–10.5)
nRBC: 0 % (ref 0.0–0.2)

## 2021-06-06 LAB — COMPREHENSIVE METABOLIC PANEL
ALT: 23 U/L (ref 0–44)
AST: 26 U/L (ref 15–41)
Albumin: 3.9 g/dL (ref 3.5–5.0)
Alkaline Phosphatase: 53 U/L (ref 38–126)
Anion gap: 8 (ref 5–15)
BUN: 14 mg/dL (ref 6–20)
CO2: 27 mmol/L (ref 22–32)
Calcium: 8.9 mg/dL (ref 8.9–10.3)
Chloride: 103 mmol/L (ref 98–111)
Creatinine, Ser: 1.2 mg/dL (ref 0.61–1.24)
GFR, Estimated: >60 mL/min (ref >60)
Glucose, Bld: 105 mg/dL — ABNORMAL HIGH (ref 70–99)
Potassium: 3.6 mmol/L (ref 3.5–5.1)
Sodium: 138 mmol/L (ref 135–145)
Total Bilirubin: 0.8 mg/dL (ref 0.3–1.2)
Total Protein: 6.9 g/dL (ref 6.5–8.1)

## 2021-06-06 LAB — I-STAT CHEM 8, ED
BUN: 17 mg/dL (ref 6–20)
Calcium, Ion: 1.15 mmol/L (ref 1.15–1.40)
Chloride: 103 mmol/L (ref 98–111)
Creatinine, Ser: 1.1 mg/dL (ref 0.61–1.24)
Glucose, Bld: 102 mg/dL — ABNORMAL HIGH (ref 70–99)
HCT: 43 % (ref 39.0–52.0)
Hemoglobin: 14.6 g/dL (ref 13.0–17.0)
Potassium: 3.5 mmol/L (ref 3.5–5.1)
Sodium: 139 mmol/L (ref 135–145)
TCO2: 28 mmol/L (ref 22–32)

## 2021-06-06 LAB — DIFFERENTIAL
Abs Immature Granulocytes: 0.01 10*3/uL (ref 0.00–0.07)
Basophils Absolute: 0 10*3/uL (ref 0.0–0.1)
Basophils Relative: 0 %
Eosinophils Absolute: 0.1 10*3/uL (ref 0.0–0.5)
Eosinophils Relative: 1 %
Immature Granulocytes: 0 %
Lymphocytes Relative: 15 %
Lymphs Abs: 1.1 10*3/uL (ref 0.7–4.0)
Monocytes Absolute: 0.3 10*3/uL (ref 0.1–1.0)
Monocytes Relative: 4 %
Neutro Abs: 5.7 10*3/uL (ref 1.7–7.7)
Neutrophils Relative %: 80 %

## 2021-06-06 LAB — PROTIME-INR
INR: 1.1 (ref 0.8–1.2)
Prothrombin Time: 13.9 seconds (ref 11.4–15.2)

## 2021-06-06 LAB — APTT: aPTT: 36 seconds (ref 24–36)

## 2021-06-06 MED ORDER — ACETAMINOPHEN 325 MG PO TABS: 650.0000 mg | ORAL_TABLET | Freq: Once | ORAL | Status: DC

## 2021-06-06 MED ORDER — SODIUM CHLORIDE 0.9% FLUSH
3.0000 mL | Freq: Once | INTRAVENOUS | Status: AC
  Administered 2021-06-06: 3 mL via INTRAVENOUS

## 2021-06-06 MED ORDER — LEVETIRACETAM 750 MG PO TABS: 750.0000 mg | ORAL_TABLET | Freq: Two times a day (BID) | ORAL | 0 refills | Status: DC

## 2021-06-06 MED ORDER — ESCITALOPRAM OXALATE 10 MG PO TABS: 10.0000 mg | ORAL_TABLET | Freq: Every day | ORAL | 0 refills | Status: DC

## 2021-06-06 MED ORDER — LEVETIRACETAM 750 MG PO TABS
750.0000 mg | ORAL_TABLET | Freq: Once | ORAL | Status: DC
  Filled 2021-06-06: qty 1

## 2021-06-06 MED ORDER — LEVETIRACETAM IN NACL 1000 MG/100ML IV SOLN
1000.0000 mg | Freq: Once | INTRAVENOUS | Status: AC
  Administered 2021-06-06: 1000 mg via INTRAVENOUS
  Filled 2021-06-06: qty 100

## 2021-06-06 MED ORDER — LEVETIRACETAM 750 MG PO TABS
750.0000 mg | ORAL_TABLET | Freq: Once | ORAL | Status: AC
  Administered 2021-06-06: 750 mg via ORAL
  Filled 2021-06-06: qty 1

## 2021-06-06 MED ORDER — IOHEXOL 350 MG/ML SOLN
100.0000 mL | Freq: Once | INTRAVENOUS | Status: AC | PRN
  Administered 2021-06-06: 100 mL via INTRAVENOUS

## 2021-06-06 MED ORDER — KETOROLAC TROMETHAMINE 30 MG/ML IJ SOLN
15.0000 mg | Freq: Once | INTRAMUSCULAR | Status: AC
  Administered 2021-06-06: 15 mg via INTRAVENOUS
  Filled 2021-06-06: qty 1

## 2021-06-06 NOTE — Telephone Encounter (Signed)
Patient's wife, [name], called to inform me that the patient is currently in the ED at [facility] for seizures. He had SRS treatment to his brain 9/15 and wanted Dr. Kathrynn Running to be aware of current status.   Khiley Lieser R.T.(T)

## 2021-06-06 NOTE — ED Provider Notes (Signed)
[facility] EMERGENCY DEPARTMENT Provider Note   CSN: [id number] Arrival date & time: 06/06/21  3570  An emergency department physician performed an initial assessment on this suspected stroke patient at [phone].  History No chief complaint on file.   Casey Branch is a 40 y.o. male with a past medical history significant for intracerebral hemorrhage with ventricular peritoneal shunt and tobacco use who presents to the ED as a code stroke.  Last known well 9:30 PM.  Patient woke up at 2 AM with a severe headache. Wife gave patient 2 aleves at that time. Wife then woke up at 5:45 AM and found patient diaphoretic and vomiting. A few episodes of NBNB emesis.  Patient was unable to recognize familiar family members.  Per EMS, patient was having slurred speech and worsening right-sided weakness.  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  While in route, patient had a 45-second focal seizure with right arm posturing and right-sided gaze. No medication needed. EMS states patient was no post-ictal following the seizure. No history of seizures per chart review and EMS.   Level 5 caveat secondary to acuity of condition  History obtained from patient, EMS, family members, and past medical records. No interpreter used during encounter.       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   Brain aneurysm 04/21/2021   ICH (intracerebral hemorrhage) ([place]) 03/19/2021   Intraventricular hemorrhage ([place]) 03/08/2021    Past Surgical History:  Procedure Laterality Date   IR 3D INDEPENDENT WKST  03/11/2021   IR ANGIO INTRA EXTRACRAN SEL COM CAROTID INNOMINATE BILAT MOD SED  03/11/2021   IR ANGIO VERTEBRAL SEL VERTEBRAL BILAT MOD SED  03/11/2021   RADIOLOGY WITH ANESTHESIA N/A 03/11/2021   Procedure: IR WITH ANESTHESIA - DIAGNOSTIC CEREBRAL  ANGIOGRAM;  Surgeon: Consuella Lose, MD;  Location: [facility];  Service: Radiology;  Laterality: N/A;   VENTRICULOPERITONEAL SHUNT         No family history on file.  Social History   Tobacco Use   Smoking status: Former    Types: Cigarettes    Passive exposure: Never   Smokeless tobacco: Never  Vaping Use   Vaping Use: Never used  Substance Use Topics   Alcohol use: Not Currently    Comment: occasioanl   Drug use: No    Home Medications Prior to Admission medications   Medication Sig Start Date End Date Taking? Authorizing Provider  escitalopram (LEXAPRO) 10 MG tablet Take 1 tablet (10 mg total) by mouth daily. 06/06/21 07/06/21 Yes Aldine Grainger, Druscilla Brownie, PA-C  levETIRAcetam (KEPPRA) 750 MG tablet Take 1 tablet (750 mg total) by mouth 2 (two) times daily. 06/06/21 07/06/21 Yes Joushua Dugar, Druscilla Brownie, PA-C  Multiple Vitamin (ONE-A-DAY MENS PO) Take 1 tablet by mouth at bedtime.   Yes [provider]  naproxen sodium (ALEVE) 220 MG tablet Take 440 mg by mouth daily as needed (headache).   Yes [provider]  donepezil (ARICEPT) 5 MG tablet Take 5 mg by mouth daily. Patient not taking: Reported on 06/06/2021 05/25/21   [provider]  LORazepam (ATIVAN) 1 MG tablet Take 1 tablet by mouth 30 minutes before procedures in radiology or radiation oncology PRN claustrophobia. Patient not taking: Reported on 06/06/2021 05/20/21   Eppie Gibson, MD  melatonin 3 MG TABS tablet Take 1 tablet (3 mg  total) by mouth at bedtime as needed. Patient not taking: No sig reported 04/01/21   Angiulli, Lavon Paganini, PA-C    Allergies    Patient has no known allergies.  Review of Systems   Review of Systems  Unable to perform ROS: Acuity of condition   Physical Exam Updated Vital Signs BP 118/72   Pulse 68   Temp 97.8 F (36.6 C) (Oral)   Resp (!) 26   SpO2 97%   Physical Exam Vitals and nursing note reviewed.  Constitutional:      General: He is not in acute distress.     Appearance: He is not ill-appearing.  HENT:     Head: Normocephalic.  Eyes:     Pupils: Pupils are equal, round, and reactive to light.  Cardiovascular:     Rate and Rhythm: Normal rate and regular rhythm.     Pulses: Normal pulses.     Heart sounds: Normal heart sounds. No murmur heard.   No friction rub. No gallop.  Pulmonary:     Effort: Pulmonary effort is normal.     Breath sounds: Normal breath sounds.  Abdominal:     General: Abdomen is flat. There is no distension.     Palpations: Abdomen is soft.     Tenderness: There is no abdominal tenderness. There is no guarding or rebound.  Musculoskeletal:        General: Normal range of motion.     Cervical back: Neck supple.  Skin:    General: Skin is warm and dry.  Neurological:     Mental Status: He is alert.     Comments: Unable to answer name, year, or where he is No facial droop Mildly slurred speech  Psychiatric:        Mood and Affect: Mood normal.        Behavior: Behavior normal.    ED Results / Procedures / Treatments   Labs (all labs ordered are listed, but only abnormal results are displayed) Labs Reviewed  COMPREHENSIVE METABOLIC PANEL - Abnormal; Notable for the following components:      Result Value   Glucose, Bld 105 (*)    All other components within normal limits  I-STAT CHEM 8, ED - Abnormal; Notable for the following components:   Glucose, Bld 102 (*)    All other components within normal limits  PROTIME-INR  APTT  CBC  DIFFERENTIAL  CBG MONITORING, ED    EKG None  Radiology EEG adult  Result Date: 06/06/2021 Lora Havens, MD     06/06/2021 10:47 AM Patient Name: Casey Branch MRN: [id number] Epilepsy Attending: Lora Havens Referring Physician/Provider: Dr Campbell Stall Date: 9/56/2130 Duration: 27.36 mins Patient history: 40 year old patient with past medical history significant for AVM previously complicated by hemorrhage s/p radiation treatment on 9/15 now presenting with recurrent  symptoms of aphasia and right-sided weakness that have been gradually resolving.  EEG to evaluate for seizures. Level of alertness: Awake AEDs during EEG study: LEV Technical aspects: This EEG study was done with scalp electrodes positioned according to the 10-20 International system of electrode placement. Electrical activity was acquired at a sampling rate of _0  and reviewed with a high frequency filter of _1  and a low frequency filter of _2 . EEG data were recorded continuously and digitally stored. Description: No clear posterior dominant rhythm was seen.  EEG showed continuous generalized and lateralized left hemisphere 3-5 acetaminophen slowing.  Abundant sharp waves were noted in left fronto- central region, maximal F3  which at times appeared quasi-periodic at 1 Hz as well as rhythmic. Hyperventilation and photic stimulation were not performed.   ABNORMALITY -Sharp wave, left frontocentral region, maximal F3 -Continuous slow, generalized and lateralized left hemisphere IMPRESSION: This study showed evidence of epileptogenicity arising from left frontocentral region with potential for seizure recurrence. Additionally there is cortical dysfunction in left hemisphere likely secondary to underlying structural abnormality as well as moderate degree encephalopathy, nonspecific etiology but could be secondary to postictal state.  No definite seizures were seen during the study. Dr Theda Sers was notified. Lora Havens   CT HEAD CODE STROKE WO CONTRAST  Result Date: 06/06/2021 CLINICAL DATA:  Code stroke.  Severe headache with aphasia EXAM: CT HEAD WITHOUT CONTRAST TECHNIQUE: Contiguous axial images were obtained from the base of the skull through the vertex without intravenous contrast. COMPARISON:  05/28/2021 brain MRI FINDINGS: Brain: Known AVM centered at the left posterior thalamus. No acute hemorrhage. Left frontal VP shunt with unchanged collapse of the left frontal horn and body and dilatation of  the left temporal horn as described on prior brain MRI. Low-density around the lateral ventricles is attributed to gliosis given priors. Prior right frontal ventriculostomy with calcification and gliotic tract. No evidence of acute infarct. Vascular: As above Skull: No acute finding Sinuses/Orbits: No acute finding Other: These results were communicated to Dr. Curly Shores at 6:49 am on 06/06/2021 by text page via the [facility] messaging system. IMPRESSION: Sequela of AVM with hemorrhage. No acute finding or change from 05/28/2021 brain MRI. Electronically Signed   By: Jorje Guild M.D.   On: 06/06/2021 06:51   CT ANGIO HEAD NECK W WO CM (CODE STROKE)  Result Date: 06/06/2021 CLINICAL DATA:  Neuro deficit, acute, stroke suspected EXAM: CT ANGIOGRAPHY HEAD AND NECK TECHNIQUE: Multidetector CT imaging of the head and neck was performed using the standard protocol during bolus administration of intravenous contrast. Multiplanar CT image reconstructions and MIPs were obtained to evaluate the vascular anatomy. Carotid stenosis measurements (when applicable) are obtained utilizing NASCET criteria, using the distal internal carotid diameter as the denominator. CONTRAST:  153m OMNIPAQUE IOHEXOL 350 MG/ML SOLN COMPARISON:  Catheter arteriogram 04/10/2021.  CTA 03/08/2021 FINDINGS: CTA NECK FINDINGS Aortic arch: Great vessel origins are patent. Right carotid system: No evidence of dissection, stenosis (50% or greater) or occlusion. Left carotid system: No evidence of dissection, stenosis (50% or greater) or occlusion. Vertebral arteries: Codominant. No evidence of dissection, stenosis (50% or greater) or occlusion. Skeleton: Bifrontal burr holes.  No acute abnormality. Other neck: No acute abnormality. Upper chest: Visualized lung apices are clear. Review of the MIP images confirms the above findings CTA HEAD FINDINGS Anterior circulation: Bilateral intracranial ICAs, MCAs, and ACAs are patent without proximal hemodynamically  significant stenosis. No aneurysm identified. Posterior circulation: Both intradural vertebral arteries are patent without significant stenosis. Basilar artery remains widely patent. Enlarged left posterior cerebral artery, which supplies the large AVM described below. Right posterior cerebral artery remains patent with small right posterior communicating artery. Venous sinuses: Normal enhancement of the dural venous sinuses. 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 Similar intra Nidal aneurysms, as characterized on prior catheter arteriogram. Venous drainage better characterized on prior catheter arteriogram. Review of the MIP images confirms the above findings IMPRESSION: 1. 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 Please see recent catheter arteriogram for further evaluation. 2. No large vessel occlusion or proximal hemodynamically significant stenosis. Electronically Signed   By: Margaretha Sheffield M.D.   On: 06/06/2021 07:21    Procedures .Critical Care Performed by: Suzy Bouchard, PA-C Authorized by: Suzy Bouchard, PA-C   Critical care provider statement:    Critical care time (minutes):  40   Critical care was necessary to treat or prevent imminent or life-threatening deterioration of the following conditions:  CNS failure or compromise   Critical care was time spent personally by me on the following activities:  Discussions with consultants, evaluation of patient's response to treatment, examination of patient, ordering and performing treatments and interventions, ordering and review of laboratory studies, ordering and review of radiographic studies, pulse oximetry, re-evaluation of patient's condition, obtaining history from patient or  surrogate and review of old charts   I assumed direction of critical care for this patient from another provider in my specialty: no     Medications Ordered in ED Medications  levETIRAcetam (KEPPRA) tablet 750 mg (has no administration in time range)  acetaminophen (TYLENOL) tablet 650 mg (650 mg Oral Not Given 06/06/21 1444)  ketorolac (TORADOL) 30 MG/ML injection 15 mg (has no administration in time range)  sodium chloride flush (NS) 0.9 % injection 3 mL (3 mLs Intravenous Given 06/06/21 0722)  iohexol (OMNIPAQUE) 350 MG/ML injection 100 mL (100 mLs Intravenous Contrast Given 06/06/21 0659)  levETIRAcetam (KEPPRA) IVPB 1000 mg/100 mL premix (0 mg Intravenous Stopped 06/06/21 0911)    ED Course  I have reviewed the triage vital signs and the nursing notes.  Pertinent labs & imaging results that were available during my care of the patient were reviewed by me and considered in my medical decision making (see chart for details).    MDM Rules/Calculators/A&P                          40 year old male presents to the ED as a code stroke.  History of previous intracerebral hemorrhage. I met patient at the CT scanner upon arrival. Patient maintaining airway. CT head negative for acute stroke. CTA head/neck ordered. Labs drawn at the bridge.   7:17 AM reported to bedside. RN concerned about possible focal seizure. Right arm shaking; however, whenever I held arm, shaking stopped. Patient was fully aware and answering questions during this episode and notes he is nervous causing him to shake. Patient unable to given full name, year, or where he is. Wife at bedside notes that is abnormal for patient.   CBC unremarkable no leukocytosis and normal hemoglobin.  CMP significant for mild hyper glycemia 105.  No anion gap.  Normal renal function.  No major electrolyte derangements.  CT head negative for any acute abnormalities.  CTA head/neck no substantial change from 3.5 cm AVM.  No LVO.  Discussed with  neurology who recommends EEG, Keppra, seizure precautions. EEG monitoring in place.   EEG performed which demonstrates: "evidence of epileptogenicity arising from left frontocentral region with potential for seizure recurrence. Additionally there is cortical dysfunction in left hemisphere likely secondary to underlying structural abnormality as well as moderate degree encephalopathy, nonspecific etiology but could be secondary to postictal state.  No definite seizures were seen during the study."  Patient given an additional 750 po Keppra per Dr. Theda Sers, neurology recommendations. Patient back to baseline. Dr. Theda Sers recommends discharge with keppra and lexapro with outpatient neurology follow-up. Ambulatory  referral placed. Seizure precautions discussed with patient and wife at bedside. Patient stable for discharge. Strict ED precautions discussed with patient. Patient states understanding and agrees to plan. Patient discharged home in no acute distress and stable vitals  Final Clinical Impression(s) / ED Diagnoses Final diagnoses:  Seizure-like activity ([place])    Rx / DC Orders ED Discharge Orders          Ordered    levETIRAcetam (KEPPRA) 750 MG tablet  2 times daily        06/06/21 1438    Ambulatory referral to Neurology       Comments: An appointment is requested in approximately: 1 week   06/06/21 1439    escitalopram (LEXAPRO) 10 MG tablet  Daily        06/06/21 1508             Suzy Bouchard, PA-C 28/24/17 5301    Lianne Cure, DO 12/23/57 1604

## 2021-06-06 NOTE — Procedures (Signed)
Patient Name: Casey Branch  MRN: [id number]  Epilepsy Attending: Charlsie Quest  Referring Physician/Provider: Dr Edwin Dada Date: 06/06/2021 Duration: 27.36 mins  Patient history: 40 year old patient with past medical history significant for AVM previously complicated by hemorrhage s/p radiation treatment on 9/15 now presenting with recurrent symptoms of aphasia and right-sided weakness that have been gradually resolving.  EEG to evaluate for seizures.  Level of alertness: Awake  AEDs during EEG study: LEV  Technical aspects: This EEG study was done with scalp electrodes positioned according to the 10-20 International system of electrode placement. Electrical activity was acquired at a sampling rate of 500Hz  and reviewed with a high frequency filter of 70Hz  and a low frequency filter of 1Hz . EEG data were recorded continuously and digitally stored.   Description: No clear posterior dominant rhythm was seen.  EEG showed continuous generalized and lateralized left hemisphere 3-5 acetaminophen slowing.  Abundant sharp waves were noted in left fronto- central region, maximal F3 which at times appeared quasi-periodic at 1 Hz as well as rhythmic. Hyperventilation and photic stimulation were not performed.     ABNORMALITY -Sharp wave, left frontocentral region, maximal F3 -Continuous slow, generalized and lateralized left hemisphere  IMPRESSION: This study showed evidence of epileptogenicity arising from left frontocentral region with potential for seizure recurrence. Additionally there is cortical dysfunction in left hemisphere likely secondary to underlying structural abnormality as well as moderate degree encephalopathy, nonspecific etiology but could be secondary to postictal state.  No definite seizures were seen during the study.  Dr was notified.  Casey Branch 

## 2021-06-06 NOTE — ED Triage Notes (Signed)
LKW: 2130 on 9/15 Patient woke up around 2:30am with bad headache, wife gave 2 Aleve, patient went back to bed.  At 0530 patients wife noticed change in speech pattern.

## 2021-06-06 NOTE — Progress Notes (Signed)
LTM EEG hooked up and running - no initial skin breakdown - push button tested - neuro notified.  

## 2021-06-06 NOTE — Progress Notes (Signed)
  Radiation Oncology         [phone] ________________________________  Stereotactic Treatment Procedure Note  Name: Brannon Levene MRN: [id number]  Date: 06/05/2021  DOB: 11-11-1980  SPECIAL TREATMENT PROCEDURE    ICD-10-CM   1. AVM (arteriovenous malformation) brain  Q28.2       3D TREATMENT PLANNING AND DOSIMETRY:  The patient's radiation plan was reviewed and approved by neurosurgery and radiation oncology prior to treatment.  It showed 3-dimensional radiation distributions overlaid onto the planning CT/MRI image set.  The [facility] for the target structures as well as the organs at risk were reviewed. The documentation of the 3D plan and dosimetry are filed in the radiation oncology EMR.  NARRATIVE:  Shanna Strength was brought to the TrueBeam stereotactic radiation treatment machine and placed supine on the CT couch. The head frame was applied, and the patient was set up for stereotactic radiosurgery.  Neurosurgery was present for the set-up and delivery  SIMULATION VERIFICATION:  In the couch zero-angle position, the patient underwent Exactrac imaging using the Brainlab system with orthogonal KV images.  These were carefully aligned and repeated to confirm treatment position for each of the isocenters.  The Exactrac snap film verification was repeated at each couch angle.  PROCEDURE: Titus Dubin received stereotactic radiosurgery to the following targets: Left thalamic AVM target was treated using 6 Rapid Arc VMAT Beams to a prescription dose of 15 Gy.  ExacTrac registration was performed for each couch angle.  The 100% isodose line was prescribed.  6 MV X-rays were delivered in the flattening filter free beam mode.  STEREOTACTIC TREATMENT MANAGEMENT:  Following delivery, the patient was transported to nursing in stable condition and monitored for possible acute effects.  Vital signs were recorded BP 135/89 (BP Location: Right Arm, Patient Position: Sitting, Cuff Size: Normal)   Pulse (!)  58   Temp 97.6 F (36.4 C)   Resp 20   SpO2 100% . The patient tolerated treatment without significant acute effects, and was discharged to home in stable condition.    PLAN: Follow-up in one month.  ________________________________  Artist Pais. Kathrynn Running, M.D.

## 2021-06-06 NOTE — Consult Note (Signed)
Neurology Consultation Reason for Consult: Code stroke for aphasia and right-sided weakness Requesting Physician: Edwin Dada  CC: Difficulty speaking and headache  History is obtained from: Patient's wife and chart review as well as EMS  HPI: Casey Branch is a 40 y.o. 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  He tolerated the radiation treatment well yesterday afternoon and seemed his normal self when they got home.  They tried to go to bed around 9:30 or 10 PM but he was having significant restlessness/insomnia.  When his wife asked him what was wrong at around 2:30 or 3 AM he endorsed having a severe headache.  She gave him some over-the-counter medication and noted that his headache was improving 30 minutes later.  However when she tried to ask him more about how he was feeling she relates he was having a great deal of difficulty talking, was breathing heavily and clammy, symptoms that had had with his prior hemorrhagic stroke.  She was advised to call EMS for transport patient was brought in as a code stroke.  EMS reports that in route he had some twitching of his right hand that then involved his arm and was associated with right gaze deviation and speech arrest.  This self terminated prior to the administration of any medications and therefore they appropriately did not give him further medication.  He was improving at the time of their arrival.  He has no known seizure history but does have significant risk factors as described above  LKW: 10:30 PM on 9/17 tPA given?: No, out of the window and intracerebral hemorrhage and intracerebral AV malformation Thrombectomy not performed given no large vessel occlusion Premorbid modified rankin scale: 2     2 - Slight disability. Able  to look after own affairs without assistance, but unable to carry out all previous activities.  (No longer driving)  ROS: Limited review of systems secondary to aphasia but negative per wife  Past Medical History:  Diagnosis Date   Brain aneurysm    Past Surgical History:  Procedure Laterality Date   IR 3D INDEPENDENT WKST  03/11/2021   IR ANGIO INTRA EXTRACRAN SEL COM CAROTID INNOMINATE BILAT MOD SED  03/11/2021   IR ANGIO VERTEBRAL SEL VERTEBRAL BILAT MOD SED  03/11/2021   RADIOLOGY WITH ANESTHESIA N/A 03/11/2021   Procedure: IR WITH ANESTHESIA - DIAGNOSTIC CEREBRAL ANGIOGRAM;  Surgeon: Lisbeth Renshaw, MD;  Location: [facility] OR;  Service: Radiology;  Laterality: N/A;   VENTRICULOPERITONEAL SHUNT     No current facility-administered medications for this encounter.  Current Outpatient Medications:    Multiple Vitamin (ONE-A-DAY MENS PO), Take 1 tablet by mouth at bedtime., Disp: , Rfl:    naproxen sodium (ALEVE) 220 MG tablet, Take 440 mg by mouth daily as needed (headache)., Disp: , Rfl:    donepezil (ARICEPT) 5 MG tablet, Take 5 mg by mouth daily. (Patient not taking: Reported on 06/06/2021), Disp: , Rfl:    LORazepam (ATIVAN) 1 MG tablet, Take 1 tablet by mouth 30 minutes before procedures in radiology or radiation oncology PRN claustrophobia. (Patient not taking: Reported on 06/06/2021), Disp: 3 tablet, Rfl: 0   melatonin 3 MG TABS tablet, Take 1 tablet (3 mg total) by mouth at bedtime as needed. (Patient not taking: No sig reported), Disp: 30 tablet, Rfl: 0  Reviewed: No family history on file.  Social History:  reports that he has quit smoking. His smoking use included cigarettes. He has never been exposed to tobacco smoke. He has never used smokeless tobacco. He reports that he does not currently use alcohol. He reports that he does not use drugs.   Exam: Current vital signs: BP 125/60 (BP Location: Right Arm)   Pulse 80   Resp 16   SpO2 100%  Vital signs in last 24  hours: Temp:  [97.6 F (36.4 C)] 97.6 F (36.4 C) (09/15 1443) Pulse Rate:  [58-80] 80 (09/16 0711) Resp:  [16-20] 16 (09/16 0711) BP: (125-135)/(60-89) 125/60 (09/16 0711) SpO2:  [100 %] 100 % (09/16 0711)   Physical Exam  Constitutional: Appears well-developed and well-nourished.  Psych: Affect appropriate to situation, mildly anxious but cooperative Eyes: No scleral injection HENT: No oropharyngeal obstruction.  MSK: no joint deformities.  Cardiovascular: Normal rate and regular rhythm.  Respiratory: Effort normal, non-labored breathing GI: Soft.  No distension. There is no tenderness.  Skin: Warm dry and intact visible skin  Neuro: Mental Status: Patient is awake, alert, able to state his name but not answer any other orientation questions  Expressive greater than receptive aphasia, able to state his name only initially, which was improving on repeat examinations Cranial Nerves: II: Visual Fields are notable for right visual field cut initially but this was resolved on subsequent evaluation. Pupils are equal, round, and reactive to light.   III,IV, VI: EOMI without ptosis or diploplia.  Saccadic pursuits V: Facial sensation is symmetric to light touch VII: Facial movement is notable for a right facial droop. VIII: hearing is intact to voice X: Uvula elevates symmetrically XII: tongue is midline without atrophy or fasciculations.  Motor: Tone is normal. Bulk is normal.  On initial examination he had drift in all 4 extremities, worse on the right than the left in the upper extremities.  Subsequently pronator drift resolved Sensory: Grossly appears slightly less reactive to touch on the right arm and leg compared to the left Deep Tendon Reflexes: 2+ and symmetric in the biceps and patellae.  Cerebellar: FNF and HKS are intact bilaterally  Initial NIH stroke scale performed on patient arrival to ED  NIHSS total 13 Score breakdown: 2 points for being unable to answer  questions, one-point for gaze preference to the left, one-point for partial right hemianopia, one-point for left upper extremity drift, 2 points for right upper extremity drift, one-point for left lower extremity drift, one-point for right lower extremity drift, one-point for sensory loss, 2 points for severe aphasia, one-point for mild dysarthria  I have reviewed labs in epic and the results pertinent to this consultation are: CBC and CMP within normal limits other than very mild elevation of RDW from I have reviewed the images obtained: Head CT personally reviewed, agree with radiology read:  "Sequela of AVM with hemorrhage. No acute finding or change from 05/28/2021 brain MRI." CTA personally reviewed, agree with radiology read:  "1. No substantial change in a large 3.5 cm arteriovenous malformation in the left thalamus, largely supplied by the left posterior cerebral artery. Please see recent catheter arteriogram for further evaluation. 2. No large vessel occlusion or proximal hemodynamically significant stenosis."    Impression: 40 year old patient with past medical history significant for AVM previously complicated by hemorrhage s/p radiation treatment on 9/15 now presenting with recurrent symptoms of aphasia and right-sided weakness that have been gradually resolving.  I am highly concerned for focal seizure,  potentially focal status epilepticus given likelihood of recurrent events without return to baseline.  Fortunately, he appears to be improving after administration of Keppra.  Recommendations: -Stat EEG -Keppra 1000 mg loading dose, may be repeated if there is concern for further seizure activity after max loading dose of 4000 mg given his weight -Continue Keppra 500 mg twice daily (contact neurology if there are repeat events for dose increase) -Seizure precautions reviewed with family at bedside given patient's ongoing confusion/aphasia and should be included in the discharge  instructions -Neurology will follow up EEG, if patient returns to baseline with no further events from a neurological perspective can be discharged with outpatient neurology follow-up  Brooke Dare MD-PhD [facility] [phone] Available 7 PM to 7 AM, outside of these hours please call Neurologist on call as listed on Amion.

## 2021-06-06 NOTE — Discharge Instructions (Addendum)
It was a pleasure taking care of you today. As discussed, I am sending you home with seizure medication. Take twice daily. I have placed a referral to neurology. They should call you within the next week. If you do not hear from them, please call to schedule an appointment. Do not drive or operate machinery until evaluated by a neurologist. Return to the ER for new or worsening symptoms.   I am also sending you home with anxiety medication. Take daily. Please follow-up with PCP for further evaluation.

## 2021-06-06 NOTE — Procedures (Signed)
Patient Name: Casey Branch  MRN: [id number]  Epilepsy Attending: Charlsie Quest  Referring Physician/Provider: Dr Marisue Humble Duration: 06/06/2021 0847 to 1556   Patient history: 40 year old patient with past medical history significant for AVM previously complicated by hemorrhage s/p radiation treatment on 9/15 now presenting with recurrent symptoms of aphasia and right-sided weakness that have been gradually resolving.  EEG to evaluate for seizures.   Level of alertness: Awake   AEDs during EEG study: LEV   Technical aspects: This EEG study was done with scalp electrodes positioned according to the 10-20 International system of electrode placement. Electrical activity was acquired at a sampling rate of 500Hz  and reviewed with a high frequency filter of 70Hz  and a low frequency filter of 1Hz . EEG data were recorded continuously and digitally stored.    Description: No clear posterior dominant rhythm was seen.  EEG showed continuous generalized and lateralized left hemisphere 3-5 acetaminophen slowing.  Abundant sharp waves were noted in left fronto- central region, maximal F3 which at times appeared quasi-periodic at 1 Hz as well as rhythmic. Hyperventilation and photic stimulation were not performed.      ABNORMALITY -Sharp wave, left frontocentral region, maximal F3 -Continuous slow, generalized and lateralized left hemisphere   IMPRESSION: This study showed evidence of epileptogenicity arising from left frontocentral region. Additionally there is cortical dysfunction in left hemisphere likely secondary to underlying structural abnormality as well as moderate degree encephalopathy, nonspecific etiology but could be secondary to postictal state.  No definite seizures were seen during the study.   Casey Branch 

## 2021-06-06 NOTE — Plan of Care (Signed)
Neurology Plan of Care  The patient returned to his baseline and we discussed increasing LEV to 750mg  bid for maintenance and follow up with outpatient neurology. We discussed plan to continue LEV 750mg  bid and if there are any breakthrough twitching he may break several tablets in half and take one and a half tablets two times daily (1500mg  bid). We discussed mood and quality of life and he admitted to being depressed and agreed to starting escitalopram 10mg  daily.  Plan: Referral to outpatient neurology. Continue levetiracetam 750mg  two times daily. If there are breakthrough twitching or seizure take one and a half tablets two times daily and notify clinic of changes. Start escitalopram 10mg  daily.    Precautions: Advised them that if there is a breakthrough seizure lasting more than 5 minutes or if he does not return to baseline he should be taken to the closest ED.  Discussed seizure statutes, patients with seizures are not allowed to drive until they have been seizure-free for six months. Use caution when using heavy equipment or power tools. Avoid working on ladders or at heights. Take showers instead of baths. Ensure the water temperature is not too high on the home water heater. Do not go swimming alone. Do not lock yourself in a room alone (i.e. bathroom). When caring for infants or small children, sit down when holding, feeding, or changing them to minimize risk of injury to the child in the event you have a seizure. Maintain good sleep hygiene. Avoid alcohol.     , MD Stroke Neurology Page: 

## 2021-06-06 NOTE — Progress Notes (Signed)
EEG completed, results pending. 

## 2021-06-06 NOTE — Progress Notes (Signed)
LTM EEG discontinued - no skin breakdown at unhook.   

## 2021-06-06 NOTE — ED Notes (Signed)
Patient arrived

## 2021-06-06 NOTE — Code Documentation (Signed)
Responded to code stroke. Called at [phone] for severe aphasia and severe headache. LKW 06/05/21 at 2130. Pt arrived at 0635, with EMS with right arm and leg twitching. CBG 102, NIH 14.   CTA: No substantial change in a large 3.5 cm arteriovenous. malformation in the left thalamus, largely supplied by the left posterior cerebral artery. Please see recent catheter arteriogram for further evaluation. No large vessel occlusion or proximal hemodynamically significant stenosis.  TNK not given- pt outside window.  Plan to treat for seizures.

## 2021-06-09 NOTE — Progress Notes (Signed)
  Name: Casey Branch  MRN: [id number]  Date: 06/05/2021   DOB: 1981/08/04  Stereotactic Radiosurgery Operative Note  PRE-OPERATIVE DIAGNOSIS:  Arteriovenous malformation  POST-OPERATIVE DIAGNOSIS:  Arteriovenous malformation  PROCEDURE:  Stereotactic Radiosurgery for AVM  SURGEON:  [name], MD  NARRATIVE: The patient underwent a radiation treatment planning session in the radiation oncology simulation suite under the care of the radiation oncology physician and physicist.  I participated closely in the radiation treatment planning afterwards. The patient underwent planning CT which was fused to 3T high resolution MRI with 1 mm axial slices.  These images were fused on the planning system.  We contoured the gross target volumes and subsequently expanded this to yield the Planning Target Volume. I actively participated in the planning process.  I helped to define and review the target contours and also the contours of the optic pathway, eyes, brainstem and selected nearby organs at risk.  All the dose constraints for critical structures were reviewed and compared to AAPM Task Group 101.  The prescription dose conformity was reviewed.  I approved the plan electronically.    Accordingly, Casey Branch was brought to the TrueBeam stereotactic radiation treatment linac and placed in the custom immobilization mask.  The patient was aligned according to the IR fiducial markers with BrainLab Exactrac, then orthogonal x-rays were used in ExacTrac with the 6DOF robotic table and the shifts were made to align the patient  Casey Branch received stereotactic radiosurgery uneventfully.    Lesions treated:  1   Complex lesions treated:  1 (>3.5 cm, <29mm of optic path, or within the brainstem)   The detailed description of the procedure is recorded in the radiation oncology procedure note.  I was present for the duration of the procedure.  DISPOSITION:  Following delivery, the patient was transported  to nursing in stable condition and monitored for possible acute effects to be discharged to home in stable condition with follow-up in one month.  [name], MD 06/09/2021 10:34 AM

## 2021-06-09 NOTE — Addendum Note (Signed)
Encounter addended by: [name], MD on: 06/09/2021 10:34 AM  Actions taken: Clinical Note Signed

## 2021-06-10 ENCOUNTER — Ambulatory Visit: Payer: 59 | Admitting: Occupational Therapy

## 2021-06-10 ENCOUNTER — Ambulatory Visit: Payer: 59

## 2021-06-12 ENCOUNTER — Ambulatory Visit: Payer: 59 | Admitting: Occupational Therapy

## 2021-06-12 ENCOUNTER — Ambulatory Visit: Payer: 59

## 2021-06-15 NOTE — Progress Notes (Signed)
Patient ID: Casey Branch, male    DOB: 1980/10/04  MRN: [id number]  CC: Annual Physical Exam  Subjective: Casey Branch is a 40 y.o. male who presents for annual physical exam.   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 30 tablet 0   levETIRAcetam (KEPPRA) 750 MG tablet Take 1 tablet (750 mg total) by mouth 2 (two) times daily. 60 tablet 0   Multiple Vitamin (ONE-A-DAY MENS PO) Take 1 tablet by mouth at bedtime.     No current facility-administered medications on file prior to visit.    No Known Allergies  Social History   Socioeconomic History   Marital status: Married    Spouse name: Not on file   Number of children: Not on file   Years of education: Not on file   Highest education level: Not on file  Occupational History   Not on file  Tobacco Use   Smoking status: Former    Types: Cigarettes    Passive exposure: Never   Smokeless tobacco: Never  Vaping Use   Vaping Use: Never used  Substance and Sexual Activity   Alcohol use: Not Currently    Comment: occasioanl   Drug use: No   Sexual activity: Yes  Other Topics Concern   Not on file  Social History Narrative   Not on file   Social Determinants of Health   Financial Resource Strain: Not on file  Food Insecurity: Not on file  Transportation Needs: Not on file  Physical Activity: Not on file  Stress: Not on file  Social Connections: Not on file   Intimate Partner Violence: Not on file    No family history on file.  Past Surgical History:  Procedure Laterality Date   IR 3D INDEPENDENT WKST  03/11/2021   IR ANGIO INTRA EXTRACRAN SEL COM CAROTID INNOMINATE BILAT MOD SED  03/11/2021   IR ANGIO VERTEBRAL SEL VERTEBRAL BILAT MOD SED  03/11/2021   RADIOLOGY WITH ANESTHESIA N/A 03/11/2021   Procedure: IR WITH ANESTHESIA - DIAGNOSTIC CEREBRAL ANGIOGRAM;  Surgeon: Lisbeth Renshaw, MD;  Location: [facility] OR;  Service: Radiology;  Laterality: N/A;   VENTRICULOPERITONEAL SHUNT      ROS: Review of Systems Negative except as stated above  PHYSICAL EXAM: BP 129/81 (BP Location: Left Arm, Patient Position: Sitting, Cuff Size: Large)   Pulse 75   Temp 98.3 F (36.8 C)   Resp 18   Ht 6' 0.52" (1.842 m)   Wt 167 lb 3.2 oz (75.8 kg)   SpO2 96%   BMI 22.35 kg/m   Physical Exam HENT:     Head: Normocephalic and atraumatic.     Right Ear: Tympanic membrane, ear canal and external ear normal.     Left Ear: Tympanic membrane, ear canal and external ear normal.  Eyes:     Extraocular Movements: Extraocular movements intact.     Conjunctiva/sclera: Conjunctivae normal.     Pupils: Pupils are equal, round, and reactive to  light.  Cardiovascular:     Rate and Rhythm: Normal rate and regular rhythm.     Pulses: Normal pulses.     Heart sounds: Normal heart sounds.  Pulmonary:     Effort: Pulmonary effort is normal.     Breath sounds: Normal breath sounds.  Abdominal:     General: Bowel sounds are normal.     Palpations: Abdomen is soft.  Genitourinary:    Comments: Patient declined exam.  Musculoskeletal:        General: Normal range of motion.     Cervical back: Normal range of motion and neck supple.  Skin:    General: Skin is warm and dry.     Capillary Refill: Capillary refill takes less than 2 seconds.  Neurological:     General: No focal deficit present.     Mental Status: He is alert and oriented to person, place, and time.   Psychiatric:        Mood and Affect: Mood normal.        Behavior: Behavior normal.    ASSESSMENT AND PLAN: 1. Annual physical exam: - Counseled on 150 minutes of exercise per week as tolerated, healthy eating (including decreased daily intake of saturated fats, cholesterol, added sugars, sodium), STI prevention, and routine healthcare maintenance.  2. Screening for metabolic disorder: - CMP last obtained 06/06/2021 and normal at that time.  3. Screening for deficiency anemia: - CBC last obtained 06/06/2021 and normal at the time.  4. Diabetes mellitus screening: - Hemoglobin A1c to screen for pre-diabetes/diabetes. - Hemoglobin A1c  5. Screening cholesterol level: - Lipid panel to screen for high cholesterol.  - Lipid panel  6. Thyroid disorder screen: - TSH to check thyroid function.  - TSH  7. Need for hepatitis C screening test: - Hepatitis C antibody to screen for hepatitis C.  - Hepatitis C Antibody  8. Encounter for screening for HIV: - HIV antibody to screen for human immunodeficiency virus.  - HIV antibody (with reflex)    Patient was given the opportunity to ask questions.  Patient verbalized understanding of the plan and was able to repeat key elements of the plan. Patient was given clear instructions to go to Emergency Department or return to medical center if symptoms don't improve, worsen, or new problems develop.The patient verbalized understanding.   Orders Placed This Encounter  Procedures   HIV antibody (with reflex)   Hepatitis C Antibody   Lipid panel   TSH   Hemoglobin A1c     Requested Prescriptions    No prescriptions requested or ordered in this encounter    Return in about 1 year (around 06/18/2022) for Physical per patient preference.  [name], NP

## 2021-06-17 ENCOUNTER — Other Ambulatory Visit: Payer: Self-pay

## 2021-06-17 ENCOUNTER — Ambulatory Visit: Payer: 59

## 2021-06-17 ENCOUNTER — Ambulatory Visit: Payer: 59 | Admitting: Occupational Therapy

## 2021-06-17 DIAGNOSIS — M6281 Muscle weakness (generalized): Secondary | ICD-10-CM

## 2021-06-17 DIAGNOSIS — R41840 Attention and concentration deficit: Secondary | ICD-10-CM

## 2021-06-17 DIAGNOSIS — R482 Apraxia: Secondary | ICD-10-CM

## 2021-06-17 DIAGNOSIS — R41844 Frontal lobe and executive function deficit: Secondary | ICD-10-CM

## 2021-06-17 DIAGNOSIS — R4701 Aphasia: Secondary | ICD-10-CM

## 2021-06-17 DIAGNOSIS — I69151 Hemiplegia and hemiparesis following nontraumatic intracerebral hemorrhage affecting right dominant side: Secondary | ICD-10-CM

## 2021-06-17 DIAGNOSIS — R41841 Cognitive communication deficit: Secondary | ICD-10-CM

## 2021-06-17 DIAGNOSIS — Z510 Encounter for antineoplastic radiation therapy: Secondary | ICD-10-CM | POA: Diagnosis not present

## 2021-06-17 DIAGNOSIS — R41842 Visuospatial deficit: Secondary | ICD-10-CM

## 2021-06-17 NOTE — Patient Instructions (Signed)
Local Driver Evaluation Programs: ° °Comprehensive Evaluation: includes clinical and in vehicle behind the wheel testing by OCCUPATIONAL THERAPIST. Programs have varying levels of adaptive controls available for trial.  ° °[facility] °[address] °[place], [place]  [address] °[phone] or [phone] °http://www.driver-rehab.com °Evaluator:  Cyndee Crompton, OT/CDRS/CDI/SCDCM/Low Vision Certification ° °[facility] °[address] °[place], [place] [address] °[phone] °https://www.[email or web address].org/home/services/rehabilitation.aspx °Evaluators:  Shannon Sheek, OT and Jill Tucker, OT ° °[facility] [place] [facility] (ONLY SERVES VETERANS!!) °[facility] °[address] °[place], [place]  [address] °[phone] °http://www.[place].gov/services/Physical_Medicine_Rehabilitation_Services.asp °Evaluators:  Eric Andrews, KT; Heidi Harris, KT;  Gary Whitaker, KT (KT=kiniesotherapist) ° ° °Clinical evaluations only:  Includes clinical testing, refers to other programs or local certified driving instructor for behind the wheel testing. ° °[facility] (outpatient Rehab) °[facility] °[address] °[place], [place] [address] °[phone] for scheduling °[email or web address]://[email or web address]/Outpatient-Rehabilitation/Neurorehabilitation-Therapy.htm °Evaluators:  Kelly Lambeth, OT; Kate Phillips, OT ° °Other area clinical evaluators available upon request including [facility], [facility] and [facility]. ° ° °    Resource List °What is a Driver Evaluation: °Your Road Ahead - A Guide to Comprehensive Driving Evaluations °http://www.thehartford.com/resources/mature-market-excellence/publications-on-aging ° °Association for [employer] - Disability and Driving Fact Sheets °http://www.aded.net/?page=510 ° °Driving after a Brain  Injury: °[employer] °http://www.biausa.org/tbims-abstracts/if-there-is-an-effective-way-to-determine-if-someone-is-ready-to-drive-after-tbi?A=SearchResult&SearchID=9495675&ObjectID=2758842&ObjectType=35 ° °Driving with Adaptive Equipment: °[facility] Process °http://www.driver-rehab.com/adaptive-equipment ° °[employer] °http://www.nmeda.com/ ° ° ° ° ° ° °  °

## 2021-06-17 NOTE — Therapy (Signed)
[facility] [address] [place], [place], [address] Phone: [phone]   Fax:  [phone]  Occupational Therapy Treatment  Patient Details  Name: Casey Branch MRN: [id number] Date of Birth: 1981/08/27 Referring Provider (OT): Sula Soda   Encounter Date: 06/17/2021   OT End of Session - 06/17/21 1024     Visit Number 11    Number of Visits 17    Date for OT Re-Evaluation 07/02/21   10 weeks for allowing for scheduling difficulties   Authorization Type [employer]    Authorization Time Period VL: 30 PT\OT, 30 ST    Authorization - Number of Visits 30    OT Start Time 1022    OT Stop Time 1100    OT Time Calculation (min) 38 min    Activity Tolerance Patient tolerated treatment well    Behavior During Therapy [facility] for tasks assessed/performed             Past Medical History:  Diagnosis Date   Brain aneurysm     Past Surgical History:  Procedure Laterality Date   IR 3D INDEPENDENT WKST  03/11/2021   IR ANGIO INTRA EXTRACRAN SEL COM CAROTID INNOMINATE BILAT MOD SED  03/11/2021   IR ANGIO VERTEBRAL SEL VERTEBRAL BILAT MOD SED  03/11/2021   RADIOLOGY WITH ANESTHESIA N/A 03/11/2021   Procedure: IR WITH ANESTHESIA - DIAGNOSTIC CEREBRAL ANGIOGRAM;  Surgeon: Lisbeth Renshaw, MD;  Location: [facility] OR;  Service: Radiology;  Laterality: N/A;   VENTRICULOPERITONEAL SHUNT      There were no vitals filed for this visit.   Subjective Assessment - 06/17/21 1023     Subjective  "this is my sister" "how did your dogs do"    Patient is accompanied by: Family member   sister   Pertinent History CVA (brain aneurysm) 2004    Limitations no driving, impulsive    Currently in Pain? No/denies             Education re: driving evaluation and returning to work. Mentioned getting out and just being at place of employment for a couple of hours at a time at first for getting used to being back in setting.   Task  Switching  between Perfection and small pegboard with good attention and alternating between tasks. Pt was not responsible for monitoring time   Hand Gripper: with RUE on level 3 with black spring. Pt picked up 1 inch blocks with gripper with min drops and min difficulty.                    OT Education - 06/17/21 1104     Education Details issued information re: driver evaluation.    Person(s) Educated Patient;Other (comment)    Methods Explanation;Handout    Comprehension Verbalized understanding              OT Short Term Goals - 05/27/21 1318       OT SHORT TERM GOAL #1   Title Pt will be independent with initial HEP    Time 4    Period Weeks    Status Achieved    Target Date 05/21/21      OT SHORT TERM GOAL #2   Title Pt will increase grip strength in RUE to 90 lbs or greater for increasing strength in R, dominant, hand.    Baseline R 81.5, L 111.5    Time 4    Period Weeks    Status On-going   72.5  lbs with RUE 05/27/21     OT SHORT TERM GOAL #3   Title Continue to assess vision and determine goals PRN    Time 4    Period Weeks    Status Achieved      OT SHORT TERM GOAL #4   Title Pt will perform simple warm meal prep and/or light housekeeping with supervision and good safety awareness    Time 4    Period Weeks    Status Achieved      OT SHORT TERM GOAL #5   Title Pt will perform environmental scanning with 90% accuracy in mod distracting environment    Baseline 12/14 in low distracting environment (86%)    Time 4    Period Weeks    Status Achieved   94% on 05/13/21              OT Long Term Goals - 06/03/21 1024       OT LONG TERM GOAL #1   Title Pt will be independent with updated HEP    Time 10    Period Weeks    Status On-going      OT LONG TERM GOAL #2   Title Pt will increase grip strength by 15 lbs or greater in RUE for increasing functional strength in RUE, dominant, hand.    Baseline R 81.5, L 111.5    Time 10     Period Weeks    Status New      OT LONG TERM GOAL #3   Title Pt will perform physical and cognitive tasks simultaneously with 95% accuracy    Time 10    Period Weeks    Status New      OT LONG TERM GOAL #4   Title Pt will perform mod complex or follow instructions or recipe in cooking task with good safety awareness and with mod I.    Time 10    Period Weeks    Status On-going      OT LONG TERM GOAL #5   Title Pt and caregiver will verbalize understanding of return to work and return to driving recommendations    Time 10    Period Weeks    Status Deferred                   Plan - 06/17/21 1103     Clinical Impression Statement Pt returns after radiation treatment. Pt had seizure activity post radiation but has since recovered. Pt is now on Keppra. Pt demonstrated good task switching today and increased skill with RUE strength    OT Occupational Profile and History Problem Focused Assessment - Including review of records relating to presenting problem    Occupational performance deficits (Please refer to evaluation for details): IADL's;Work;Leisure    Body Structure / Function / Physical Skills Decreased knowledge of use of DME;Strength;GMC;Dexterity;Proprioception;UE functional use;IADL;Vision;Flexibility;FMC;Endurance    Cognitive Skills Problem Solve;Sequencing;Safety Awareness;Attention;Memory    Rehab Potential Good    Clinical Decision Making Limited treatment options, no task modification necessary    Comorbidities Affecting Occupational Performance: None    Modification or Assistance to Complete Evaluation  No modification of tasks or assist necessary to complete eval    OT Frequency 2x / week    OT Duration Other (comment)   16 visits over 10 weeks for scheduling conflicts   OT Treatment/Interventions Self-care/ADL training;DME and/or AE instruction;Therapeutic activities;Energy conservation;Patient/family education;Visual/perceptual  remediation/compensation;Therapeutic exercise;Cognitive remediation/compensation;Neuromuscular education;Functional Mobility Training    Plan discuss dc vs  renewal/adding visits    Consulted and Agree with Plan of Care Patient;Family member/caregiver    Family Member Consulted spouse, Water quality scientist             Patient will benefit from skilled therapeutic intervention in order to improve the following deficits and impairments:   Body Structure / Function / Physical Skills: Decreased knowledge of use of DME, Strength, GMC, Dexterity, Proprioception, UE functional use, IADL, Vision, Flexibility, FMC, Endurance Cognitive Skills: Problem Solve, Sequencing, Safety Awareness, Attention, Memory     Visit Diagnosis: Attention and concentration deficit  Muscle weakness (generalized)  Visuospatial deficit  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

## 2021-06-18 ENCOUNTER — Other Ambulatory Visit: Payer: Self-pay

## 2021-06-18 ENCOUNTER — Encounter: Payer: Self-pay | Admitting: Family

## 2021-06-18 ENCOUNTER — Ambulatory Visit (INDEPENDENT_AMBULATORY_CARE_PROVIDER_SITE_OTHER): Payer: 59 | Admitting: Family

## 2021-06-18 VITALS — BP 129/81 | HR 75 | Temp 98.3°F | Resp 18 | Ht 72.52 in | Wt 167.2 lb

## 2021-06-18 DIAGNOSIS — Z1329 Encounter for screening for other suspected endocrine disorder: Secondary | ICD-10-CM

## 2021-06-18 DIAGNOSIS — Z130 Encounter for screening for diseases of the blood and blood-forming organs and certain disorders involving the immune mechanism: Secondary | ICD-10-CM

## 2021-06-18 DIAGNOSIS — Z13220 Encounter for screening for lipoid disorders: Secondary | ICD-10-CM

## 2021-06-18 DIAGNOSIS — Z0000 Encounter for general adult medical examination without abnormal findings: Secondary | ICD-10-CM

## 2021-06-18 DIAGNOSIS — Z131 Encounter for screening for diabetes mellitus: Secondary | ICD-10-CM

## 2021-06-18 DIAGNOSIS — Z13228 Encounter for screening for other metabolic disorders: Secondary | ICD-10-CM

## 2021-06-18 DIAGNOSIS — Z114 Encounter for screening for human immunodeficiency virus [HIV]: Secondary | ICD-10-CM

## 2021-06-18 DIAGNOSIS — Z1159 Encounter for screening for other viral diseases: Secondary | ICD-10-CM

## 2021-06-18 NOTE — Progress Notes (Signed)
Pt presents for annual physical exam has no other concerns 

## 2021-06-18 NOTE — Patient Instructions (Signed)
  Please complete the assigned speech therapy homework prior to your next session and return it to the speech therapist at your next visit.  

## 2021-06-18 NOTE — Therapy (Signed)
[facility] [address] [place], [place], [address] Phone: [phone]   Fax:  [phone]  Speech Language Pathology Treatment  Patient Details  Name: Casey Branch MRN: [id number] Date of Birth: 14-Sep-1981 Referring Provider (SLP): Lauraine Rinne, [place]   Encounter Date: 06/17/2021   End of Session - 06/18/21 1250     Visit Number 10    Number of Visits 25    Date for SLP Re-Evaluation 07/21/21    Authorization Type 30 ST    SLP Start Time 0935    SLP Stop Time  1015    SLP Time Calculation (min) 40 min    Activity Tolerance Patient tolerated treatment well             Past Medical History:  Diagnosis Date   Brain aneurysm     Past Surgical History:  Procedure Laterality Date   IR 3D INDEPENDENT WKST  03/11/2021   IR ANGIO INTRA EXTRACRAN SEL COM CAROTID INNOMINATE BILAT MOD SED  03/11/2021   IR ANGIO VERTEBRAL SEL VERTEBRAL BILAT MOD SED  03/11/2021   RADIOLOGY WITH ANESTHESIA N/A 03/11/2021   Procedure: IR WITH ANESTHESIA - DIAGNOSTIC CEREBRAL ANGIOGRAM;  Surgeon: Consuella Lose, MD;  Location: [facility];  Service: Radiology;  Laterality: N/A;   VENTRICULOPERITONEAL SHUNT      There were no vitals filed for this visit.   Subjective Assessment - 06/18/21 1247     Subjective "He didn't have a convulsing seizure. His muscles were more like twitching, and he threw up." (sister Casey Pina)    Patient is accompained by: Family member   sister   Currently in Pain? No/denies                   ADULT SLP TREATMENT - 06/18/21 0001       General Information   Behavior/Cognition Alert;Cooperative;Pleasant mood;Other (comment)   very flat affect     Treatment Provided   Treatment provided Cognitive-Linquistic      Cognitive-Linquistic Treatment   Treatment focused on Cognition;Patient/family/caregiver education    Skilled Treatment [place] was more interactive with SLP than with previous session SLP  saw pt. He recalled that MD in ED did not really shed much new light on his condition. He req'd cues for details for looking at appointments in [place], to see which therapy he had next "It's neuro," pt stated initially. With mod-max cues pt told SLP "OT". In an unfamiliar detailed task (simple) pt req'd mod extra time and occasional SLP cues for attention and usual SLP cues for error awareness. Homework provided for pt.      Assessment / Recommendations / Plan   Plan Continue with current plan of care      Progression Toward Goals   Progression toward goals Progressing toward goals              SLP Education - 06/18/21 1250     Education Details deficit areas (details)    Person(s) Educated Patient;Caregiver(s)    Methods Explanation    Comprehension Verbalized understanding              SLP Short Term Goals - 05/27/21 1247       SLP SHORT TERM GOAL #1   Title pt will use memory compensations for checking appointment schedule in 3 sessions with min A    Status Not Met    Target Date 05/23/21      SLP SHORT TERM GOAL #2   Title pt  will demo knowledge of three non-physical deficits in 3 sessions    Status Not Met    Target Date 05/23/21      SLP SHORT TERM GOAL #3   Title pt will demonstrate sustained attention for 8 minutes to a functional  cognitive linguistic task in 3 sesisons    Baseline 05-08-21, 05-15-21, 05-27-21    Status Achieved    Target Date 05/23/21      SLP SHORT TERM GOAL #4   Title pt will successfully use compensations for anomia in 10 minute simple-mod complex conversation in 3 sessions    Status Partially Met    Target Date 05/23/21      SLP SHORT TERM GOAL #5   Title complete formal cognitive linguisitic teasting    Status Achieved    Target Date 05/09/21              SLP Long Term Goals - 06/18/21 1253       SLP LONG TERM GOAL #1   Title pt will successfully use compensations for anomia in 10 minute mod-max complex conversation in 3  sessions    Time 8    Period Weeks    Status On-going    Target Date 06/20/21      SLP LONG TERM GOAL #2   Title pt will demo functional alternating attention between two mod complex linguistic tasks in 3 sessions    Time 12    Period Weeks    Status On-going    Target Date 07/18/21      SLP LONG TERM GOAL #3   Title pt will use memory compensations to accomplish things in his daily schedule (to-do list, etc) for/between 3 sessions    Time 12    Period Weeks    Status On-going    Target Date 07/18/21      SLP LONG TERM GOAL #4   Title pt will successfully use compensations for anomia in 10 minute max complex conversation in 3 sessions    Time 12    Period Weeks    Status On-going              Plan - 06/18/21 [place] presents today with s/sx of a cognitive linguistic deficits in the areas of attention, awareness, memory, and aphasia. Pt's verbal language today continued as functional. Casey Branch at this time continues with decr'd awareness and insight into his defcit areas however was not vocal about his disappointment about not working or lack of working at his full time job. 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    Speech Therapy Frequency 2x / week    Duration 12 weeks    Treatment/Interventions Environmental controls;Functional tasks;SLP instruction and feedback;Cueing hierarchy;Language facilitation;Cognitive reorganization;Compensatory strategies;Internal/external aids;Patient/family education    Potential to Achieve Goals Good    SLP Home Exercise Plan provided    Consulted and Agree with Plan of Care Patient;Family member/caregiver    Family Member Consulted wife, Casey Branch             Patient will benefit from skilled therapeutic intervention in order to improve the  following deficits and impairments:   Cognitive communication deficit  Apraxia  Aphasia    Problem List Patient Active Problem List   Diagnosis Date Noted   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 [place], [place], [address] Phone: [phone]   Fax:  [phone]   Name: Casey Branch MRN: [id number] Date of Birth: [birth year]

## 2021-06-18 NOTE — Patient Instructions (Signed)
Preventive Care 41-41 Years Old, Male Preventive care refers to lifestyle choices and visits with your health care provider that can promote health and wellness. This includes: A yearly physical exam. This is also called an annual wellness visit. Regular dental and eye exams. Immunizations. Screening for certain conditions. Healthy lifestyle choices, such as: Eating a healthy diet. Getting regular exercise. Not using drugs or products that contain nicotine and tobacco. Limiting alcohol use. What can I expect for my preventive care visit? Physical exam Your health care provider will check your: Height and weight. These may be used to calculate your BMI (body mass index). BMI is a measurement that tells if you are at a healthy weight. Heart rate and blood pressure. Body temperature. Skin for abnormal spots. Counseling Your health care provider may ask you questions about your: Past medical problems. Family's medical history. Alcohol, tobacco, and drug use. Emotional well-being. Home life and relationship well-being. Sexual activity. Diet, exercise, and sleep habits. Work and work environment. Access to firearms. What immunizations do I need? Vaccines are usually given at various ages, according to a schedule. Your health care provider will recommend vaccines for you based on your age, medical history, and lifestyle or other factors, such as travel or where you work. What tests do I need? Blood tests Lipid and cholesterol levels. These may be checked every 5 years, or more often if you are over 50 years old. Hepatitis C test. Hepatitis B test. Screening Lung cancer screening. You may have this screening every year starting at age 55 if you have a 30-pack-year history of smoking and currently smoke or have quit within the past 15 years. Prostate cancer screening. Recommendations will vary depending on your family history and other risks. Genital exam to check for testicular cancer  or hernias. Colorectal cancer screening. All adults should have this screening starting at age 50 and continuing until age 75. Your health care provider may recommend screening at age 45 if you are at increased risk. You will have tests every 1-10 years, depending on your results and the type of screening test. Diabetes screening. This is done by checking your blood sugar (glucose) after you have not eaten for a while (fasting). You may have this done every 1-3 years. STD (sexually transmitted disease) testing, if you are at risk. Follow these instructions at home: Eating and drinking  Eat a diet that includes fresh fruits and vegetables, whole grains, lean protein, and low-fat dairy products. Take vitamin and mineral supplements as recommended by your health care provider. Do not drink alcohol if your health care provider tells you not to drink. If you drink alcohol: Limit how much you have to 0-2 drinks a day. Be aware of how much alcohol is in your drink. In the [place] one drink equals one 12 oz bottle of beer (355 mL), one 5 oz glass of wine (148 mL), or one 1 oz glass of hard liquor (44 mL). Lifestyle Take daily care of your teeth and gums. Brush your teeth every morning and night with fluoride toothpaste. Floss one time each day. Stay active. Exercise for at least 30 minutes 5 or more days each week. Do not use any products that contain nicotine or tobacco, such as cigarettes, e-cigarettes, and chewing tobacco. If you need help quitting, ask your health care provider. Do not use drugs. If you are sexually active, practice safe sex. Use a condom or other form of protection to prevent STIs (sexually transmitted infections). If told by your   health care provider, take low-dose aspirin daily starting at age 50. Find healthy ways to cope with stress, such as: Meditation, yoga, or listening to music. Journaling. Talking to a trusted person. Spending time with friends and  family. Safety Always wear your seat belt while driving or riding in a vehicle. Do not drive: If you have been drinking alcohol. Do not ride with someone who has been drinking. When you are tired or distracted. While texting. Wear a helmet and other protective equipment during sports activities. If you have firearms in your house, make sure you follow all gun safety procedures. What's next? Go to your health care provider once a year for an annual wellness visit. Ask your health care provider how often you should have your eyes and teeth checked. Stay up to date on all vaccines. This information is not intended to replace advice given to you by your health care provider. Make sure you discuss any questions you have with your health care provider. Document Revised: 11/15/2020 Document Reviewed: 09/01/2018 Elsevier Patient Education  2022 [employer]vier Inc.   

## 2021-06-19 ENCOUNTER — Ambulatory Visit: Payer: 59 | Admitting: Occupational Therapy

## 2021-06-19 ENCOUNTER — Other Ambulatory Visit: Payer: Self-pay | Admitting: Family

## 2021-06-19 ENCOUNTER — Encounter: Payer: Self-pay | Admitting: Family

## 2021-06-19 ENCOUNTER — Ambulatory Visit: Payer: 59

## 2021-06-19 ENCOUNTER — Encounter: Payer: Self-pay | Admitting: Occupational Therapy

## 2021-06-19 DIAGNOSIS — R41844 Frontal lobe and executive function deficit: Secondary | ICD-10-CM

## 2021-06-19 DIAGNOSIS — E785 Hyperlipidemia, unspecified: Secondary | ICD-10-CM

## 2021-06-19 DIAGNOSIS — M6281 Muscle weakness (generalized): Secondary | ICD-10-CM

## 2021-06-19 DIAGNOSIS — I69151 Hemiplegia and hemiparesis following nontraumatic intracerebral hemorrhage affecting right dominant side: Secondary | ICD-10-CM

## 2021-06-19 DIAGNOSIS — R41842 Visuospatial deficit: Secondary | ICD-10-CM

## 2021-06-19 DIAGNOSIS — R4701 Aphasia: Secondary | ICD-10-CM

## 2021-06-19 DIAGNOSIS — R41840 Attention and concentration deficit: Secondary | ICD-10-CM

## 2021-06-19 DIAGNOSIS — R41841 Cognitive communication deficit: Secondary | ICD-10-CM

## 2021-06-19 DIAGNOSIS — Z510 Encounter for antineoplastic radiation therapy: Secondary | ICD-10-CM | POA: Diagnosis not present

## 2021-06-19 LAB — HEPATITIS C ANTIBODY: Hep C Virus Ab: <0.1 s/co ratio (ref 0.0–0.9)

## 2021-06-19 LAB — HEMOGLOBIN A1C
Est. average glucose Bld gHb Est-mCnc: 103 mg/dL
Hgb A1c MFr Bld: 5.2 % (ref 4.8–5.6)

## 2021-06-19 LAB — HIV ANTIBODY (ROUTINE TESTING W REFLEX): HIV Screen 4th Generation wRfx: NONREACTIVE

## 2021-06-19 LAB — LIPID PANEL
Chol/HDL Ratio: 5.5 ratio — ABNORMAL HIGH (ref 0.0–5.0)
Cholesterol, Total: 298 mg/dL — ABNORMAL HIGH (ref 100–199)
HDL: 54 mg/dL (ref >39)
LDL Chol Calc (NIH): 203 mg/dL — ABNORMAL HIGH (ref 0–99)
Triglycerides: 211 mg/dL — ABNORMAL HIGH (ref 0–149)
VLDL Cholesterol Cal: 41 mg/dL — ABNORMAL HIGH (ref 5–40)

## 2021-06-19 LAB — TSH: TSH: 0.551 u[IU]/mL (ref 0.450–4.500)

## 2021-06-19 MED ORDER — ATORVASTATIN CALCIUM 20 MG PO TABS: 20.0000 mg | ORAL_TABLET | Freq: Every day | ORAL | 0 refills | Status: DC

## 2021-06-19 NOTE — Patient Instructions (Addendum)
[name],  Today was Uzair's last scheduled visit. We had discussed what you all wanted the plan to do going forward but since no one was present, we weren't sure how to proceed. We have both put in notes to schedule more visits if you guys are in agreement to continue.  Please give [place] a call at [phone] and let the front office know how you all would like to proceed (scheduling more visits or discharging at this time).   Thank you so much, Kallie Edward, MOT, OTR/L

## 2021-06-19 NOTE — Therapy (Signed)
[facility] [address] [place], [place], [address] Phone: [phone]   Fax:  [phone]  Occupational Therapy Treatment  Patient Details  Name: Casey Branch MRN: [id number] Date of Birth: 07-02-1981 Referring Provider (OT): Sula Soda   Encounter Date: 06/19/2021   OT End of Session - 06/19/21 1024     Visit Number 12    Number of Visits 17    Date for OT Re-Evaluation 07/02/21   10 weeks for allowing for scheduling difficulties   Authorization Type [employer]    Authorization Time Period VL: 30 PT\OT, 30 ST    Authorization - Number of Visits 30    OT Start Time 1018    OT Stop Time 1100    OT Time Calculation (min) 42 min    Activity Tolerance Patient tolerated treatment well    Behavior During Therapy [facility] for tasks assessed/performed             Past Medical History:  Diagnosis Date   Brain aneurysm     Past Surgical History:  Procedure Laterality Date   IR 3D INDEPENDENT WKST  03/11/2021   IR ANGIO INTRA EXTRACRAN SEL COM CAROTID INNOMINATE BILAT MOD SED  03/11/2021   IR ANGIO VERTEBRAL SEL VERTEBRAL BILAT MOD SED  03/11/2021   RADIOLOGY WITH ANESTHESIA N/A 03/11/2021   Procedure: IR WITH ANESTHESIA - DIAGNOSTIC CEREBRAL ANGIOGRAM;  Surgeon: Lisbeth Renshaw, MD;  Location: [facility] OR;  Service: Radiology;  Laterality: N/A;   VENTRICULOPERITONEAL SHUNT      There were no vitals filed for this visit.   Subjective Assessment - 06/19/21 1024     Subjective  Pt denies any pain. Pt arrives unaccompanied today.    Pertinent History CVA (brain aneurysm) 2004    Limitations no driving, impulsive    Currently in Pain? No/denies    Pain Score 0-No pain               12 piece puzzle (tiger) with min A for cues for attending to details. Pt had increased difficulty when distracted with questions from therapist  Functional Problem Solving max assistance for organizing information and  sequencing tasks. Pt req'd max cueing for breaking down each step and problem solving tasks.                   OT Education - 06/19/21 1032     Education Details sent note home to spouse re: proceeding/adding visits vs discharing. instructed to call office with decision.    Person(s) Educated Patient    Methods Explanation;Handout    Comprehension Verbalized understanding              OT Short Term Goals - 05/27/21 1318       OT SHORT TERM GOAL #1   Title Pt will be independent with initial HEP    Time 4    Period Weeks    Status Achieved    Target Date 05/21/21      OT SHORT TERM GOAL #2   Title Pt will increase grip strength in RUE to 90 lbs or greater for increasing strength in R, dominant, hand.    Baseline R 81.5, L 111.5    Time 4    Period Weeks    Status On-going   72.5 lbs with RUE 05/27/21     OT SHORT TERM GOAL #3   Title Continue to assess vision and determine goals PRN    Time 4  Period Weeks    Status Achieved      OT SHORT TERM GOAL #4   Title Pt will perform simple warm meal prep and/or light housekeeping with supervision and good safety awareness    Time 4    Period Weeks    Status Achieved      OT SHORT TERM GOAL #5   Title Pt will perform environmental scanning with 90% accuracy in mod distracting environment    Baseline 12/14 in low distracting environment (86%)    Time 4    Period Weeks    Status Achieved   94% on 05/13/21              OT Long Term Goals - 06/03/21 1024       OT LONG TERM GOAL #1   Title Pt will be independent with updated HEP    Time 10    Period Weeks    Status On-going      OT LONG TERM GOAL #2   Title Pt will increase grip strength by 15 lbs or greater in RUE for increasing functional strength in RUE, dominant, hand.    Baseline R 81.5, L 111.5    Time 10    Period Weeks    Status New      OT LONG TERM GOAL #3   Title Pt will perform physical and cognitive tasks simultaneously with 95%  accuracy    Time 10    Period Weeks    Status New      OT LONG TERM GOAL #4   Title Pt will perform mod complex or follow instructions or recipe in cooking task with good safety awareness and with mod I.    Time 10    Period Weeks    Status On-going      OT LONG TERM GOAL #5   Title Pt and caregiver will verbalize understanding of return to work and return to driving recommendations    Time 10    Period Weeks    Status Deferred                   Plan - 06/19/21 1026     Clinical Impression Statement Pt unaccompanied to therapy today. Pt demonstrating improvement however continues to have significant difficulty with problem solving and organizing information.    OT Occupational Profile and History Problem Focused Assessment - Including review of records relating to presenting problem    Occupational performance deficits (Please refer to evaluation for details): IADL's;Work;Leisure    Body Structure / Function / Physical Skills Decreased knowledge of use of DME;Strength;GMC;Dexterity;Proprioception;UE functional use;IADL;Vision;Flexibility;FMC;Endurance    Cognitive Skills Problem Solve;Sequencing;Safety Awareness;Attention;Memory    Rehab Potential Good    Clinical Decision Making Limited treatment options, no task modification necessary    Comorbidities Affecting Occupational Performance: None    Modification or Assistance to Complete Evaluation  No modification of tasks or assist necessary to complete eval    OT Frequency 2x / week    OT Duration Other (comment)   16 visits over 10 weeks for scheduling conflicts   OT Treatment/Interventions Self-care/ADL training;DME and/or AE instruction;Therapeutic activities;Energy conservation;Patient/family education;Visual/perceptual remediation/compensation;Therapeutic exercise;Cognitive remediation/compensation;Neuromuscular education;Functional Mobility Training    Plan sent letter home to spouse for scheduling more visits or dc,  problem solving (simple), attention    Consulted and Agree with Plan of Care Patient;Family member/caregiver    Family Member Consulted spouse, [name]  Patient will benefit from skilled therapeutic intervention in order to improve the following deficits and impairments:   Body Structure / Function / Physical Skills: Decreased knowledge of use of DME, Strength, GMC, Dexterity, Proprioception, UE functional use, IADL, Vision, Flexibility, [facility], Endurance Cognitive Skills: Problem Solve, Sequencing, Safety Awareness, Attention, Memory     Visit Diagnosis: Frontal lobe and executive function deficit  Attention and concentration deficit  Muscle weakness (generalized)  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)  Visuospatial deficit    Problem List Patient Active Problem List   Diagnosis Date Noted   Hyperlipidemia 06/19/2021   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

## 2021-06-19 NOTE — Progress Notes (Signed)
Thyroid function normal.   Hepatitis C negative.   HIV negative.   No diabetes.   Cholesterol higher than expected. High cholesterol may increase risk of heart attack and/or stroke. Consider eating more fruits, vegetables, and lean baked meats such as chicken or fish. Moderate intensity exercise at least 150 minutes as tolerated per week may help as well.   Begin Atorvastatin (Lipitor) for high cholesterol. Encouraged to recheck cholesterol in 6 to 8 weeks. Please call our office to schedule lab only appointment for this.   The following is for provider reference only: The 10-year ASCVD risk score (Arnett DK, et al., 2019) is: 5.9%   Values used to calculate the score:     Age: 40 years     Sex: Male     Is Non-Hispanic African American: Yes     Diabetic: No     Tobacco smoker: Yes     Systolic Blood Pressure: 129 mmHg     Is BP treated: No     HDL Cholesterol: 54 mg/dL     Total Cholesterol: 298 mg/dL

## 2021-06-20 NOTE — Patient Instructions (Signed)
  Please complete the assigned speech therapy homework prior to your next session and return it to the speech therapist at your next visit.  

## 2021-06-20 NOTE — Therapy (Signed)
[facility] [address] [place], [place], [address] Phone: [phone]   Fax:  [phone]  Speech Language Pathology Treatment  Patient Details  Name: Casey Branch MRN: [id number] Date of Birth: 04/27/1981 Referring Provider (SLP): Lauraine Rinne, [place]   Encounter Date: 06/19/2021   End of Session - 06/20/21 0901     Visit Number 11    Number of Visits 25    Date for SLP Re-Evaluation 07/21/21    Authorization Type 30 ST    SLP Start Time 0934    SLP Stop Time  1015    SLP Time Calculation (min) 41 min    Activity Tolerance Patient tolerated treatment well             Past Medical History:  Diagnosis Date   Brain aneurysm     Past Surgical History:  Procedure Laterality Date   IR 3D INDEPENDENT WKST  03/11/2021   IR ANGIO INTRA EXTRACRAN SEL COM CAROTID INNOMINATE BILAT MOD SED  03/11/2021   IR ANGIO VERTEBRAL SEL VERTEBRAL BILAT MOD SED  03/11/2021   RADIOLOGY WITH ANESTHESIA N/A 03/11/2021   Procedure: IR WITH ANESTHESIA - DIAGNOSTIC CEREBRAL ANGIOGRAM;  Surgeon: Consuella Lose, MD;  Location: [facility];  Service: Radiology;  Laterality: N/A;   VENTRICULOPERITONEAL SHUNT      There were no vitals filed for this visit.   Subjective Assessment - 06/20/21 0856     Subjective Pt arrived with homework completed.    Currently in Pain? No/denies                   ADULT SLP TREATMENT - 06/20/21 0001       General Information   Behavior/Cognition Alert;Cooperative;Pleasant mood;Other (comment)   flat affect     Treatment Provided   Treatment provided Cognitive-Linquistic      Cognitive-Linquistic Treatment   Treatment focused on Cognition;Patient/family/caregiver education;Aphasia    Skilled Treatment "Oh I'm sure it's right," pt stated as he gave homework to SLP. SLP had pt correct homework, and he caught 2/9 errors. Decr'd intellectual and emergent awareness observed. Pt exhibited some  decr'd accuracy with reading likely aphasic. This could have played a role in pt accuracy, although most errors were pt read the word correctly just did not pay attention to make a mark for the word at all (written directions). Pt homework to correct the remaining homework.      Assessment / Recommendations / Plan   Plan Continue with current plan of care      Progression Toward Goals   Progression toward goals Not progressing toward goals (comment)   decr'd awareness             SLP Education - 06/20/21 0901     Education Details pt's deficits deal with details, pt needs to double check all of his work    [employer]) Educated Patient    Methods Explanation    Comprehension Need further instruction              SLP Short Term Goals - 05/27/21 1247       SLP SHORT TERM GOAL #1   Title pt will use memory compensations for checking appointment schedule in 3 sessions with min A    Status Not Met    Target Date 05/23/21      SLP SHORT TERM GOAL #2   Title pt will demo knowledge of three non-physical deficits in 3 sessions    Status Not Met  Target Date 05/23/21      SLP SHORT TERM GOAL #3   Title pt will demonstrate sustained attention for 8 minutes to a functional  cognitive linguistic task in 3 sesisons    Baseline 05-08-21, 05-15-21, 05-27-21    Status Achieved    Target Date 05/23/21      SLP SHORT TERM GOAL #4   Title pt will successfully use compensations for anomia in 10 minute simple-mod complex conversation in 3 sessions    Status Partially Met    Target Date 05/23/21      SLP SHORT TERM GOAL #5   Title complete formal cognitive linguisitic teasting    Status Achieved    Target Date 05/09/21              SLP Long Term Goals - 06/20/21 0903       SLP LONG TERM GOAL #1   Title pt will successfully use compensations for anomia in 10 minute mod-max complex conversation in 3 sessions    Time 8    Period Weeks    Status Unable to assess   pt has not  demonstrated any notable verbal expression/anomia difficulty     SLP LONG TERM GOAL #2   Title pt will demo functional alternating attention between two mod complex linguistic tasks in 3 sessions    Time 12    Period Weeks    Status On-going    Target Date 07/18/21      SLP LONG TERM GOAL #3   Title pt will use memory compensations to accomplish things in his daily schedule (to-do list, etc) for/between 3 sessions    Time 12    Period Weeks    Status On-going    Target Date 07/18/21      SLP LONG TERM GOAL #4   Title pt will successfully use compensations for anomia in 10 minute max complex conversation in 3 sessions    Time 12    Period Weeks    Status On-going    Target Date 07/18/21              Plan - 06/20/21 0902     Clinical Impression Statement Casey Branch presents today with cont'd s/sx of a cognitive linguistic deficits in the areas of attention, awareness, memory, and aphasia. Pt's verbal language today continued as functional, although some aphasic errors were seen in reading. Casey Branch at this time continues with decr'd awareness and insight into his defcit areas. 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    Speech Therapy Frequency 2x / week    Duration 12 weeks    Treatment/Interventions Environmental controls;Functional tasks;SLP instruction and feedback;Cueing hierarchy;Language facilitation;Cognitive reorganization;Compensatory strategies;Internal/external aids;Patient/family education    Potential to Achieve Goals Good    SLP Home Exercise Plan provided    Consulted and Agree with Plan of Care Patient;Family member/caregiver    Family Member Consulted wife, Casey Branch             Patient will benefit from skilled therapeutic intervention in order to improve the following deficits and impairments:   Cognitive  communication deficit  Aphasia    Problem List Patient Active Problem List   Diagnosis Date Noted   Hyperlipidemia 06/19/2021   Seizure after head injury ([place]) 06/06/2021   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 [place], [place], [address] Phone: [phone]   Fax:  [phone]   Name: Casey Branch MRN: [id number] Date of Birth: [birth year]

## 2021-07-01 ENCOUNTER — Ambulatory Visit (INDEPENDENT_AMBULATORY_CARE_PROVIDER_SITE_OTHER): Payer: 59 | Admitting: Neurology

## 2021-07-01 ENCOUNTER — Encounter: Payer: Self-pay | Admitting: Neurology

## 2021-07-01 VITALS — BP 131/80 | HR 77 | Ht 72.0 in | Wt 171.0 lb

## 2021-07-01 DIAGNOSIS — G40009 Localization-related (focal) (partial) idiopathic epilepsy and epileptic syndromes with seizures of localized onset, not intractable, without status epilepticus: Secondary | ICD-10-CM | POA: Diagnosis not present

## 2021-07-01 DIAGNOSIS — Q282 Arteriovenous malformation of cerebral vessels: Secondary | ICD-10-CM | POA: Diagnosis not present

## 2021-07-01 DIAGNOSIS — I615 Nontraumatic intracerebral hemorrhage, intraventricular: Secondary | ICD-10-CM

## 2021-07-01 DIAGNOSIS — I619 Nontraumatic intracerebral hemorrhage, unspecified: Secondary | ICD-10-CM | POA: Diagnosis not present

## 2021-07-01 MED ORDER — GABAPENTIN 600 MG PO TABS: 600.0000 mg | ORAL_TABLET | Freq: Every evening | ORAL | 0 refills | Status: DC

## 2021-07-01 MED ORDER — LEVETIRACETAM 750 MG PO TABS: 750.0000 mg | ORAL_TABLET | Freq: Two times a day (BID) | ORAL | 4 refills | Status: DC

## 2021-07-01 MED ORDER — ESCITALOPRAM OXALATE 10 MG PO TABS: 10.0000 mg | ORAL_TABLET | Freq: Every day | ORAL | 3 refills | Status: DC

## 2021-07-01 NOTE — Progress Notes (Signed)
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  Per wife on September 15 patient has his first radiation therapy and on the evening he has been complaining of headache.  She gave him over-the-counter medication but the headaches was not improving then she noted that patient started vomiting and was unable to respond.  She called 911 because she was worried that he might be having a third stroke.  In the ED a head CT head was done and was negative for acute ischemic or hemorrhagic stroke.  Patient was noted to have right arm posturing and there was a concern for seizures.  He was loaded with Keppra 1000 mg and had a EEG which showed left frontal central sharp and left focal slowing but no definite seizure were captured.  He was started on Keppra 750 twice a day and sent home with neurological follow-up.  Since being home patient reported being compliant with the medication, denies any side effect from the medication and is tolerating well.  He denies any previous seizures history of seizures and no additional seizures since being discharged.  He was also discharged on Lexapro 10 mg daily   Handedness: Right-handed  Seizure Type: Likely focal with right arm posturing and shaking and inability to speak  Current frequency: Only 1  Any injuries from seizures: None  Seizure risk factors: Left thalamic AVM, hemorrhagic stroke.  Previous ASMs: None none  Currenty ASMs:  Levetiracetam  ASMs side effects: None  Brain Images: AVM left posterior thalamus   Previous EEGs: Sharp left frontocentral region, left focal slowing    OTHER MEDICAL CONDITIONS: None  REVIEW OF SYSTEMS: Full 14 system review of systems performed and negative with exception of: As noted in the HPI  ALLERGIES: No Known Allergies  HOME MEDICATIONS: Outpatient Medications Prior to Visit  Medication Sig Dispense Refill   atorvastatin (LIPITOR) 20 MG tablet Take 1 tablet (20 mg total) by mouth daily. 120 tablet 0   Multiple Vitamin (ONE-A-DAY MENS PO) Take 1 tablet by mouth at bedtime.     escitalopram (LEXAPRO) 10 MG tablet Take 1 tablet (10 mg total) by mouth daily. 30 tablet 0   levETIRAcetam (KEPPRA) 750 MG tablet Take 1 tablet (750 mg total) by mouth 2 (two) times daily. 60 tablet 0   No facility-administered medications prior to visit.    PAST MEDICAL HISTORY: Past Medical History:  Diagnosis Date   Brain aneurysm     PAST SURGICAL HISTORY: Past Surgical History:  Procedure Laterality Date   IR 3D INDEPENDENT WKST  03/11/2021   IR ANGIO INTRA EXTRACRAN SEL COM CAROTID INNOMINATE BILAT MOD SED  03/11/2021   IR ANGIO VERTEBRAL SEL VERTEBRAL BILAT MOD SED  03/11/2021   RADIOLOGY WITH ANESTHESIA N/A 03/11/2021   Procedure: IR WITH ANESTHESIA - DIAGNOSTIC CEREBRAL ANGIOGRAM;  Surgeon: Lisbeth Renshaw, MD;  Location: [facility] OR;  Service: Radiology;  Laterality: N/A;   VENTRICULOPERITONEAL SHUNT      FAMILY HISTORY:  History reviewed. No pertinent family history.  SOCIAL HISTORY: Social History   Socioeconomic History   Marital status: Married    Spouse name: Not on file   Number of children: Not on file   Years of education: Not on file   Highest education level: Not on file  Occupational History   Not on file  Tobacco Use   Smoking status: Former    Types: Cigarettes    Passive exposure: Never   Smokeless tobacco: Never  Vaping Use   Vaping Use: Never used   Substance and Sexual Activity   Alcohol use: Not Currently    Comment: occasioanl   Drug use: No   Sexual activity: Yes  Other Topics Concern   Not on file  Social History Narrative   Not on file   Social Determinants of Health   Financial Resource Strain: Not on file  Food Insecurity: Not on file  Transportation Needs: Not on file  Physical Activity: Not on file  Stress: Not on file  Social Connections: Not on file  Intimate Partner Violence: Not on file     PHYSICAL EXAM GENERAL EXAM/CONSTITUTIONAL: Vitals:  Vitals:   07/01/21 0815  BP: 131/80  Pulse: 77  Weight: 171 lb (77.6 kg)  Height: 6' (1.829 m)   Body mass index is 23.19 kg/m. Wt Readings from Last 3 Encounters:  07/01/21 171 lb (77.6 kg)  06/18/21 167 lb 3.2 oz (75.8 kg)  05/29/21 174 lb 6 oz (79.1 kg)   Patient is in no distress; well developed, nourished and groomed; neck is supple  EYES: Pupils round and reactive to light, Visual fields full to confrontation, Extraocular movements intacts,  No results found.  MUSCULOSKELETAL: Gait, strength, tone, movements noted in Neurologic exam below  NEUROLOGIC: MENTAL STATUS:  No flowsheet data found. awake, alert, oriented to person, place and time recent and remote memory intact normal attention and concentration language fluent, comprehension intact, naming intact fund of knowledge appropriate  CRANIAL NERVE:  2nd, 3rd, 4th, 6th - pupils equal and reactive to light, visual fields full to confrontation, extraocular muscles intact, no nystagmus 5th - facial sensation symmetric 7th - facial strength symmetric 8th - hearing intact 9th - palate elevates symmetrically, uvula midline 11th - shoulder shrug symmetric 12th - tongue protrusion midline  MOTOR:  normal bulk and tone, full strength in the BUE, BLE but there is BUE drift R>L  SENSORY:  normal and symmetric to light touch  COORDINATION:  finger-nose-finger, fine finger movements  normal  REFLEXES:  deep tendon reflexes present and symmetric  GAIT/STATION:  normal     DIAGNOSTIC DATA (LABS, IMAGING, TESTING) - I reviewed patient records, labs, notes, testing and imaging myself where available.  Lab Results  Component Value Date   WBC 7.3 06/06/2021   HGB 14.6 06/06/2021   HCT 43.0 06/06/2021   MCV 90.8 06/06/2021   PLT 206 06/06/2021      Component Value Date/Time   NA 139 06/06/2021 0655   K 3.5 06/06/2021 0655   CL 103 06/06/2021 0655   CO2 27 06/06/2021 0640   GLUCOSE 102 (H) 06/06/2021 0655   BUN 17 06/06/2021 0655   CREATININE 1.10 06/06/2021 0655   CREATININE 1.20 05/29/2021 1045   CALCIUM 8.9 06/06/2021 0640   PROT 6.9 06/06/2021 0640   ALBUMIN 3.9 06/06/2021 0640   AST 26 06/06/2021 0640   ALT 23 06/06/2021 0640   ALKPHOS 53 06/06/2021 0640   BILITOT 0.8 06/06/2021 0640   GFRNONAA >  60 06/06/2021 0640   GFRNONAA >60 05/29/2021 1045   Lab Results  Component Value Date   CHOL 298 (H) 06/18/2021   HDL 54 06/18/2021   LDLCALC 203 (H) 06/18/2021   TRIG 211 (H) 06/18/2021   Lab Results  Component Value Date   HGBA1C 5.2 06/18/2021   No results found for: [id number] Lab Results  Component Value Date   TSH 0.551 06/18/2021    Head CT 06/06/2021 Known AVM centered at the left posterior thalamus. No acute hemorrhage. Left frontal VP shunt with unchanged collapse of the left frontal horn and body and dilatation of the left temporal horn as described on prior brain MRI. Low-density around the lateral ventricles is attributed to gliosis given priors. Prior right frontal ventriculostomy with calcification and gliotic tract. No evidence of acute infarct    EEG 06/06/2021 ABNORMALITY -Sharp wave, left frontocentral region, maximal F3 -Continuous slow, generalized and lateralized left hemisphere   IMPRESSION: This study showed evidence of epileptogenicity arising from left frontocentral region. Additionally there is cortical  dysfunction in left hemisphere likely secondary to underlying structural abnormality as well as moderate degree encephalopathy, nonspecific etiology but could be secondary to postictal state.  No definite seizures were seen during the study.  I personally reviewed brain Images and previous EEG reports.   ASSESSMENT AND PLAN  40 y.o. year old male  with new onset epilepsy in the setting of left posterior thalamus AVM and previous hemorrhagic stroke.  He only had 1 lifetime seizure and is well controlled on levetiracetam 750 twice daily.  He denies any side effect from the medication.  I will get a level and continue the patient on levetiracetam 750 mg twice daily.  I will see him for follow-up in 7-month.  Driving law discussed and seizure precaution also discussed with patient and wife.  I also refilled his Lexapro and levetiracetam.    1. Localization-related idiopathic epilepsy and epileptic syndromes with seizures of localized onset, not intractable, without status epilepticus (HCC)   2. Nontraumatic intracerebral hemorrhage, unspecified cerebral location, unspecified laterality (HCC)   3. Intraventricular hemorrhage (HCC)   4. AVM (arteriovenous malformation) brain       PLAN: Continue with Keppra 750 mg twice a day  Will check a Keppra level today  Follow up in 6 months    Per [facility] statutes, patients with seizures are not allowed to drive until they have been seizure-free for six months.  Other recommendations include using caution when using heavy equipment or power tools. Avoid working on ladders or at heights. Take showers instead of baths.  Do not swim alone.  Ensure the water temperature is not too high on the home water heater. Do not go swimming alone. Do not lock yourself in a room alone (i.e. bathroom). When caring for infants or small children, sit down when holding, feeding, or changing them to minimize risk of injury to the child in the event you have a seizure.  Maintain good sleep hygiene. Avoid alcohol.  Also recommend adequate sleep, hydration, good diet and minimize stress.   During the Seizure  - First, ensure adequate ventilation and place patients on the floor on their left side  Loosen clothing around the neck and ensure the airway is patent. If the patient is clenching the teeth, do not force the mouth open with any object as this can cause severe damage - Remove all items from the surrounding that can be hazardous. The patient may be oblivious to what's  happening and may not even know what he or she is doing. If the patient is confused and wandering, either gently guide him/her away and block access to outside areas - Reassure the individual and be comforting - Call 911. In most cases, the seizure ends before EMS arrives. However, there are cases when seizures may last over 3 to 5 minutes. Or the individual may have developed breathing difficulties or severe injuries. If a pregnant patient or a person with diabetes develops a seizure, it is prudent to call an ambulance. - Finally, if the patient does not regain full consciousness, then call EMS. Most patients will remain confused for about 45 to 90 minutes after a seizure, so you must use judgment in calling for help. - Avoid restraints but make sure the patient is in a bed with padded side rails - Place the individual in a lateral position with the neck slightly flexed; this will help the saliva drain from the mouth and prevent the tongue from falling backward - Remove all nearby furniture and other hazards from the area - Provide verbal assurance as the individual is regaining consciousness - Provide the patient with privacy if possible - Call for help and start treatment as ordered by the caregiver   After the Seizure (Postictal Stage)  After a seizure, most patients experience confusion, fatigue, muscle pain and/or a headache. Thus, one should permit the individual to sleep. For the next  few days, reassurance is essential. Being calm and helping reorient the person is also of importance.  Most seizures are painless and end spontaneously. Seizures are not harmful to others but can lead to complications such as stress on the lungs, brain and the heart. Individuals with prior lung problems may develop labored breathing and respiratory distress.     Orders Placed This Encounter  Procedures   Levetiracetam level     Meds ordered this encounter  Medications   levETIRAcetam (KEPPRA) 750 MG tablet    Sig: Take 1 tablet (750 mg total) by mouth 2 (two) times daily.    Dispense:  180 tablet    Refill:  4   escitalopram (LEXAPRO) 10 MG tablet    Sig: Take 1 tablet (10 mg total) by mouth daily.    Dispense:  90 tablet    Refill:  3   gabapentin (NEURONTIN) 600 MG tablet    Sig: Take 1 tablet (600 mg total) by mouth at bedtime.    Dispense:  30 tablet    Refill:  0     Return in about 6 months (around 12/30/2021).    Windell Norfolk, MD 07/01/2021, 9:10 AM  [facility] [address] [place], [place] [address] [phone]

## 2021-07-01 NOTE — Patient Instructions (Addendum)
Continue with Keppra 750 mg twice a day  Will check a Keppra level today  Follow up in 6 months     Per [facility] statutes, patients with seizures are not allowed to drive until they have been seizure-free for six months.  Other recommendations include using caution when using heavy equipment or power tools. Avoid working on ladders or at heights. Take showers instead of baths.  Do not swim alone.  Ensure the water temperature is not too high on the home water heater. Do not go swimming alone. Do not lock yourself in a room alone (i.e. bathroom). When caring for infants or small children, sit down when holding, feeding, or changing them to minimize risk of injury to the child in the event you have a seizure. Maintain good sleep hygiene. Avoid alcohol.  Also recommend adequate sleep, hydration, good diet and minimize stress.   During the Seizure  - First, ensure adequate ventilation and place patients on the floor on their left side  Loosen clothing around the neck and ensure the airway is patent. If the patient is clenching the teeth, do not force the mouth open with any object as this can cause severe damage - Remove all items from the surrounding that can be hazardous. The patient may be oblivious to what's happening and may not even know what he or she is doing. If the patient is confused and wandering, either gently guide him/her away and block access to outside areas - Reassure the individual and be comforting - Call 911. In most cases, the seizure ends before EMS arrives. However, there are cases when seizures may last over 3 to 5 minutes. Or the individual may have developed breathing difficulties or severe injuries. If a pregnant patient or a person with diabetes develops a seizure, it is prudent to call an ambulance. - Finally, if the patient does not regain full consciousness, then call EMS. Most patients will remain confused for about 45 to 90 minutes after a seizure, so you must  use judgment in calling for help. - Avoid restraints but make sure the patient is in a bed with padded side rails - Place the individual in a lateral position with the neck slightly flexed; this will help the saliva drain from the mouth and prevent the tongue from falling backward - Remove all nearby furniture and other hazards from the area - Provide verbal assurance as the individual is regaining consciousness - Provide the patient with privacy if possible - Call for help and start treatment as ordered by the caregiver   After the Seizure (Postictal Stage)  After a seizure, most patients experience confusion, fatigue, muscle pain and/or a headache. Thus, one should permit the individual to sleep. For the next few days, reassurance is essential. Being calm and helping reorient the person is also of importance.  Most seizures are painless and end spontaneously. Seizures are not harmful to others but can lead to complications such as stress on the lungs, brain and the heart. Individuals with prior lung problems may develop labored breathing and respiratory distress.

## 2021-07-02 LAB — LEVETIRACETAM LEVEL: Levetiracetam Lvl: 30.3 ug/mL (ref 10.0–40.0)

## 2021-07-07 ENCOUNTER — Encounter: Payer: Self-pay | Admitting: Urology

## 2021-07-07 NOTE — Progress Notes (Signed)
Patient states doing well. No symptoms reported at this time.  Meaningful use complete.  Patient notified of 9:00am-07/09/21 telephone appointment and verbalized understanding.

## 2021-07-09 ENCOUNTER — Ambulatory Visit
Admission: RE | Admit: 2021-07-09 | Discharge: 2021-07-09 | Disposition: A | Discharge status: Home / Self Care | Payer: 59 | Source: Ambulatory Visit | Attending: Urology | Admitting: Urology

## 2021-07-09 DIAGNOSIS — Q282 Arteriovenous malformation of cerebral vessels: Secondary | ICD-10-CM

## 2021-07-09 NOTE — Progress Notes (Signed)
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  Narrative:  I spoke with the patient to conduct his routine scheduled 1 month follow up visit via telephone to spare the patient unnecessary potential exposure in the healthcare setting during the current COVID-19 pandemic.  The patient was notified in advance and gave permission to proceed with this visit format.  He tolerated the radiation well and was discharged home in stable condition.                              On review of systems, the patient states that he is doing well in general and currently without complaints.  He currently denies headache, changes in visual or auditory acuity, difficulty with word finding, dizziness/imbalance or seizure activity.  Unfortunately, he did experience a seizure 1 day post-SRS treatment and was started on Keppra 750mg  po BID which he is tolerating well and has not had any breakthrough seizure activity on this medication. He had a recent follow up visit with Dr. on 07/01/21 and the plan is to continue the current dose of Keppra for seizure prophylaxis x6 months and follow up at that point to determine total duration for this medication. He was also started on Lexapro daily to help with depression and feels that this is working well and is well-tolerated.  He has continued to make good progress with occupational and speech therapy.  He was recently released to a home exercise program by Occupational Therapy and has recently resumed working 2 to 3 hours/day to gradually ease himself back into a more normal routine.   Speech therapy is currently on hold but they will have a follow-up visit in a few months to assess progress and any further needs for continued therapy.  Overall, he is pleased with his progress to date.  ALLERGIES:  has No Known Allergies.  Meds: Current Outpatient Medications  Medication Sig Dispense Refill   atorvastatin (LIPITOR) 20 MG tablet Take 1 tablet (20 mg total) by mouth daily. 120 tablet 0   escitalopram (LEXAPRO) 10 MG tablet Take 1 tablet (10 mg total) by mouth daily. 90 tablet 3   gabapentin (NEURONTIN) 600 MG tablet Take 1 tablet (600 mg total) by mouth at bedtime. 30 tablet 0   levETIRAcetam (KEPPRA) 750 MG tablet Take 1 tablet (750 mg total) by mouth 2 (two) times daily. 180 tablet 4   Multiple Vitamin (ONE-A-DAY MENS PO) Take 1 tablet by mouth at bedtime.     No current facility-administered medications for this encounter.    Physical Findings:  vitals were not taken for this visit.  Pain Assessment Pain Score: 0-No pain/10 Unable to assess due to telephone follow up visit format.  Lab Findings: Lab Results  Component Value Date   WBC 7.3 06/06/2021   HGB 14.6 06/06/2021   HCT 43.0 06/06/2021   MCV 90.8 06/06/2021   PLT 206 06/06/2021     Radiographic Findings: No results found.  Impression/Plan: 1. 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 He appears to have recovered well from the effects of his recent Casey Branch treatment and is currently without complaints.  He will continue taking the Keppra 750 mg p.o. twice daily as prescribed and will continue and follow-up with neurology for management.  We discussed that while we are happy to continue to participate in his care if clinically indicated, at this point, we will plan to see him back on an as-needed basis.  We will help to coordinate for his initial posttreatment MRI brain in December 2022, prior to a follow-up visit with Dr. Johnsie Branch who will continue to follow him long-term.   We will continue to follow his progress and review his MRI brain scans in the multidisciplinary brain tumor conference.  They are comfortable and in agreement with the stated plan and know that they are welcome to call at anytime with any questions or concerns related to his previous radiation.     [name], PA-C

## 2021-07-15 ENCOUNTER — Other Ambulatory Visit: Payer: Self-pay | Admitting: Radiation Therapy

## 2021-07-15 DIAGNOSIS — Q283 Other malformations of cerebral vessels: Secondary | ICD-10-CM

## 2021-07-31 ENCOUNTER — Other Ambulatory Visit: Payer: Self-pay

## 2021-07-31 ENCOUNTER — Encounter: Payer: Self-pay | Admitting: Physical Medicine and Rehabilitation

## 2021-07-31 ENCOUNTER — Encounter: Payer: 59 | Attending: Physical Medicine and Rehabilitation | Admitting: Physical Medicine and Rehabilitation

## 2021-07-31 VITALS — BP 134/79 | HR 60 | Ht 72.0 in | Wt 168.0 lb

## 2021-07-31 DIAGNOSIS — R569 Unspecified convulsions: Secondary | ICD-10-CM | POA: Insufficient documentation

## 2021-07-31 DIAGNOSIS — I619 Nontraumatic intracerebral hemorrhage, unspecified: Secondary | ICD-10-CM | POA: Insufficient documentation

## 2021-07-31 DIAGNOSIS — G4700 Insomnia, unspecified: Secondary | ICD-10-CM | POA: Insufficient documentation

## 2021-07-31 DIAGNOSIS — Z79899 Other long term (current) drug therapy: Secondary | ICD-10-CM | POA: Insufficient documentation

## 2021-07-31 MED ORDER — LEVETIRACETAM 500 MG PO TABS: 500.0000 mg | ORAL_TABLET | Freq: Two times a day (BID) | ORAL | 3 refills | Status: DC

## 2021-07-31 MED ORDER — ESCITALOPRAM OXALATE 5 MG PO TABS: 5.0000 mg | ORAL_TABLET | Freq: Every day | ORAL | 3 refills | Status: DC

## 2021-07-31 MED ORDER — GABAPENTIN 400 MG PO CAPS: 400.0000 mg | ORAL_CAPSULE | Freq: Every day | ORAL | 3 refills | Status: DC

## 2021-07-31 NOTE — Patient Instructions (Signed)
-  Try to go outside near sunrise -Get exercise during the day.  -Turn off all devices an hour before bedtime.  -Teas that can benefit: chamomile, valerian root, Brahmi (Bacopa) -Can consider over the counter melatonin or magnesium glycinate (latter can also help with constipation) -Pistachios naturally increase the production of melatonin  -tart cherry juice

## 2021-07-31 NOTE — Progress Notes (Signed)
Subjective:    Patient ID: Casey Branch, male    DOB: [birth year], 40 y.o.   MRN: [id number]  HPI Casey Branch is a 40 year old man who presents for follow-up after ICH.  -He was admitted to the hospital for seizure activity seen on EEG. This was the day after he received radiation. He was started on Lexapro and his sister is concerned that this is making him more depressed.   -He has cognition issues with the bleed.   His sister is concerned about the side effects of Keppra and if the negatives are outweighing the good.   His cognition has greatly improved.   He has had boughts of independence.   -He has been doing well at home!  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 20/20 as per optometry   Pain Inventory Average Pain 0 Pain Right Now 0 My pain is  no pain  LOCATION OF PAIN  no pain   BOWEL Number of stools per week: 7  BLADDER Normal   Mobility walk without assistance how many minutes can you walk? unlimited ability to climb steps?  yes do you drive?  no  Function disabled: date disabled . Do you have any goals in this area?  yes  Neuro/Psych No problems in this area  Prior Studies Hospital follow up  Physicians involved in your care Hospital follow up   No family history on file. Social History   Socioeconomic History   Marital status: Married    Spouse name: Not on file   Number of children: Not on file   Years of education: Not on file   Highest education level: Not on file  Occupational History   Not on file  Tobacco Use   Smoking  status: Former    Types: Cigarettes    Passive exposure: Never   Smokeless tobacco: Never  Vaping Use   Vaping Use: Never used  Substance and Sexual Activity   Alcohol use: Not Currently    Comment: occasioanl   Drug use: No   Sexual activity: Yes  Other Topics Concern   Not on file  Social History Narrative   Not on file   Social Determinants of Health   Financial Resource Strain: Not on file  Food Insecurity: Not on file  Transportation Needs: Not on file  Physical Activity: Not on file  Stress: Not on file  Social Connections: Not on file   Past Surgical History:  Procedure Laterality Date   IR 3D INDEPENDENT WKST  03/11/2021   IR ANGIO INTRA EXTRACRAN SEL COM CAROTID INNOMINATE BILAT MOD SED  03/11/2021   IR ANGIO VERTEBRAL SEL VERTEBRAL BILAT MOD SED  03/11/2021   RADIOLOGY WITH ANESTHESIA N/A 03/11/2021   Procedure: IR WITH ANESTHESIA - DIAGNOSTIC CEREBRAL ANGIOGRAM;  Surgeon: Lisbeth Renshaw, MD;  Location: [facility];  Service: Radiology;  Laterality: N/A;   VENTRICULOPERITONEAL SHUNT     Past Medical History:  Diagnosis Date   Brain aneurysm    BP 134/79   Pulse 60   Ht 6' (  1.829 m)   Wt 168 lb (76.2 kg)   SpO2 97%   BMI 22.78 kg/m   Opioid Risk Score:   Fall Risk Score:  `1  Depression screen PHQ 2/9  Depression screen [facility] 2/9 04/30/2021 04/21/2021  Decreased Interest 0 0  Down, Depressed, Hopeless 0 0  PHQ - 2 Score 0 0  Altered sleeping 0 1  Tired, decreased energy 0 2  Change in appetite 0 0  Feeling bad or failure about yourself  0 0  Trouble concentrating 0 0  Moving slowly or fidgety/restless 0 0  Suicidal thoughts 0 0  PHQ-9 Score 0 3  Difficult doing work/chores - Not difficult at all    Review of Systems     Objective:   Physical Exam Gen: no distress, normal appearing, accompanied by sister today.  HEENT: oral mucosa pink and moist, NCAT Cardio: Reg rate Chest: normal effort, normal rate of breathing Abd: soft, non-distended Ext:  no edema Psych: pleasant, flat affect Skin: intact Neuro/MSK: excellent ambulation and strength. Improved ability to trach with EOMI. No more nystagmus. Wife provides most medical history     Assessment & Plan:  1) ICH -continue outpatient OT and SP RETURN TO DRIVING PLAN:  WITH THE SUPERVISION OF A LICENSED DRIVER, PLEASE DRIVE IN AN EMPTY PARKING LOT FOR AT LEAST 2-3 TRIALS TO TEST REACTION TIME, VISION, USE OF EQUIPMENT IN CAR, ETC.  IF SUCCESSFUL WITH THE PARKING LOT DRIVING, PROCEED TO SUPERVISED DRIVING TRIALS IN YOUR NEIGHBORHOOD STREETS AT LOW TRAFFIC TIMES TO TEST OBSERVATION TO TRAFFIC SIGNALS, REACTION TIME, ETC. PLEASE ATTEMPT AT LEAST 2-3 TRIALS IN YOUR NEIGHBORHOOD.  IF NEIGHBORHOOD DRIVING IS SUCCESSFUL, YOU MAY PROCEED TO DRIVING IN BUSIER AREAS IN YOUR COMMUNITY WITH SUPERVISION OF A LICENSED DRIVER. PLEASE ATTEMPT AT LEAST 4-5 TRIALS.  IF COMMUNITY DRIVING IS SUCCESSFUL, YOU MAY PROCEED TO DRIVING ALONE, DURING THE DAY TIME, IN NON-PEAK TRAFFIC TIMES.   -Once successfully returned to driving independently, may return to work  2) Insomnia: -Try to go outside near sunrise -Get exercise during the day.  -Turn off all devices an hour before bedtime.  -Teas that can benefit: chamomile, valerian root, Brahmi (Bacopa) -Can consider over the counter melatonin or magnesium glycinate (latter can also help with constipation) -Pistachios naturally increase the production of melatonin   3) Impaired attention -continue modafinil -recommended blueberries, salmon, green tea with ginko baloba and ginseng from the [place] of Tea  4) Seizure:  -discussed sister's concerns regarding Keppra and Lexapro and their potential side effects.  -reviewed EEG with patient and sister: "evidence of epileptogenicity arising from left frontocentral region with potential for seizure recurrence. Additionally there is cortical dysfunction in left hemisphere likely secondary to underlying structural  abnormality as well as moderate degree encephalopathy, nonspecific etiology but could be secondary to postictal state.  No definite seizures were seen during the study." -given patient and his sister's concerns, can decrease Keppra 500mg  BID -can drive as there was no evidence of seizure on EEG- his inability to drive is greatly impacting his quality of life.   5) depression: -denies depression.  -decrease lexapro to 5mg .  -discussed neuropsych referral.   6) Polypharmacy -patient and sister are not sure why he is on gabapentin.  -decrease gabapentin to 400mg  HS

## 2021-07-31 NOTE — Addendum Note (Signed)
Addended by: Horton Chin on: 07/31/2021 06:56 PM   Modules accepted: Orders

## 2021-08-17 NOTE — Progress Notes (Signed)
  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  ExacTrac registration was performed for each couch angle.  The 100% isodose line was prescribed.  6 MV X-rays were delivered in the flattening filter free beam mode.  Narrative: The patient tolerated radiation treatment relatively well.   No complications occurred.  Plan: The patient has completed radiation treatment. The patient will return to radiation oncology clinic for routine followup in one month. I advised him to call or return sooner if he has any questions or concerns related to his recovery or treatment. ________________________________  Artist Pais. Kathrynn Running, M.D.

## 2021-08-22 ENCOUNTER — Other Ambulatory Visit: Payer: Self-pay | Admitting: Neurology

## 2021-09-03 ENCOUNTER — Other Ambulatory Visit: Payer: Self-pay

## 2021-09-03 ENCOUNTER — Other Ambulatory Visit: Payer: 59

## 2021-09-03 DIAGNOSIS — E785 Hyperlipidemia, unspecified: Secondary | ICD-10-CM

## 2021-09-04 ENCOUNTER — Encounter: Payer: Self-pay | Admitting: Family

## 2021-09-04 ENCOUNTER — Ambulatory Visit
Admission: RE | Admit: 2021-09-04 | Discharge: 2021-09-04 | Disposition: A | Discharge status: Home / Self Care | Payer: 59 | Source: Ambulatory Visit | Attending: Radiation Oncology | Admitting: Radiation Oncology

## 2021-09-04 DIAGNOSIS — Q283 Other malformations of cerebral vessels: Secondary | ICD-10-CM

## 2021-09-04 LAB — LIPID PANEL
Chol/HDL Ratio: 2.7 ratio (ref 0.0–5.0)
Cholesterol, Total: 168 mg/dL (ref 100–199)
HDL: 62 mg/dL (ref >39)
LDL Chol Calc (NIH): 92 mg/dL (ref 0–99)
Triglycerides: 71 mg/dL (ref 0–149)
VLDL Cholesterol Cal: 14 mg/dL (ref 5–40)

## 2021-09-04 MED ORDER — GADOBENATE DIMEGLUMINE 529 MG/ML IV SOLN
16.0000 mL | Freq: Once | INTRAVENOUS | Status: AC | PRN
  Administered 2021-09-04: 16 mL via INTRAVENOUS

## 2021-09-04 NOTE — Progress Notes (Signed)
Cholesterol improved since 2 months ago. Continue Atorvastatin for cholesterol maintenance.

## 2021-09-08 ENCOUNTER — Inpatient Hospital Stay: Payer: 59 | Attending: Radiation Oncology

## 2021-09-17 ENCOUNTER — Other Ambulatory Visit: Payer: Self-pay | Admitting: Family

## 2021-09-17 DIAGNOSIS — E785 Hyperlipidemia, unspecified: Secondary | ICD-10-CM

## 2021-09-22 ENCOUNTER — Other Ambulatory Visit: Payer: Self-pay | Admitting: Physical Medicine and Rehabilitation

## 2021-09-25 ENCOUNTER — Encounter: Payer: Self-pay | Admitting: Physical Medicine and Rehabilitation

## 2021-12-03 NOTE — Progress Notes (Signed)
? ?Subjective:  ? ? Patient ID: Casey Branch, male    DOB: 08/11/1981, 41 y.o.   MRN: [id number] ? ?HPI ?[name] is a 41 year old man who presents for follow-up after ICH. ? ?-He stopped taking all his medication.  ? ?-doing great at home and at work ? ?-he has resumed driving without issues ? ?-denies pain, visual disturbances ? ?-has no other questions ?  ?Prior history: ?-He was admitted to the hospital for seizure activity seen on EEG. This was the day after he received radiation. He was started on Lexapro and his sister is concerned that this is making him more depressed.  ? ?-He has cognition issues with the bleed.  ? ?His sister is concerned about the side effects of Keppra and if the negatives are outweighing the good.  ? ?His cognition has greatly improved.  ? ?He has had boughts of independence.  ? ?-He has been doing well at home! ? ?-Reviewed his medications and his wife will let me know if he needs any refills ? ?-His wife asks whether he needs all three medications ? ?-She notes that he has been sleeping during the day and sleeping poorly at night ? ?-He has 1 year MRI in September  ? ?-Denies pain ? 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 ?-has seen optometrist and vision has improved. 20/20 as per optometry. No visiion visions ? ? ?Pain Inventory ?Average Pain 0 ?Pain Right Now 0 ?My pain is  no pain ? ?LOCATION OF PAIN  no pain ? ? ?BOWEL ?Number of stools per week: 4 ? ?BLADDER ?Normal ? ? ?Mobility ?walk without assistance ?how many minutes can you walk? unlimited ?ability to climb steps?  yes ?do you drive?  yes ? 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 ?Neuro/Psych ?No problems in this area ? ?Prior Studies ?Hospital follow up ? ?Physicians involved in your care ?Hospital follow up ? ? ?No family history on file. ?Social History  ? ?Socioeconomic History  ? Marital status: Married  ?  Spouse name: Not on file  ? Number of  children: Not on file  ? Years of education: Not on file  ? Highest education level: Not on file  ?Occupational History  ? Not on file  ?Tobacco Use  ? Smoking status: Former  ?  Types: Cigarettes  ?  Passive exposure: Never  ? Smokeless tobacco: Never  ?Vaping Use  ? Vaping Use: Never used  ?Substance and Sexual Activity  ? Alcohol use: Not Currently  ?  Comment: occasioanl  ? Drug use: No  ? Sexual activity: Yes  ?Other Topics Concern  ? Not on file  ?Social History Narrative  ? Not on file  ? ?Social Determinants of Health  ? ?Financial Resource Strain: Not on file  ?Food Insecurity: Not on file  ?Transportation Needs: Not on file  ?Physical Activity: Not on file  ?Stress: Not on file  ?Social Connections: Not on file  ? ?Past Surgical History:  ?Procedure Laterality Date  ? IR 3D INDEPENDENT WKST  03/11/2021  ? IR ANGIO INTRA EXTRACRAN SEL COM CAROTID INNOMINATE BILAT MOD SED  03/11/2021  ? IR ANGIO VERTEBRAL SEL VERTEBRAL BILAT MOD SED  03/11/2021  ? RADIOLOGY WITH ANESTHESIA N/A 03/11/2021  ? Procedure: IR WITH ANESTHESIA - DIAGNOSTIC CEREBRAL ANGIOGRAM;  Surgeon: Consuella Lose, MD;  Location: [facility];  Service: Radiology;  Laterality: N/A;  ? VENTRICULOPERITONEAL  SHUNT    ? ?Past Medical History:  ?Diagnosis Date  ? Brain aneurysm   ? ?BP (!) 141/86   Pulse 61   Ht 6' (1.829 m)   Wt 168 lb (76.2 kg)   SpO2 97%   BMI 22.78 kg/m?  ? ?Opioid Risk Score:   ?Fall Risk Score:  `1 ? ?Depression screen PHQ 2/9 ? ?Depression screen [facility] 2/9 04/30/2021 04/21/2021  ?Decreased Interest 0 0  ?Down, Depressed, Hopeless 0 0  ?PHQ - 2 Score 0 0  ?Altered sleeping 0 1  ?Tired, decreased energy 0 2  ?Change in appetite 0 0  ?Feeling bad or failure about yourself  0 0  ?Trouble concentrating 0 0  ?Moving slowly or fidgety/restless 0 0  ?Suicidal thoughts 0 0  ?PHQ-9 Score 0 3  ?Difficult doing work/chores - Not difficult at all  ? ? ?Review of Systems ?Denies cognitive deficits ?   ?Objective:  ? Physical Exam ?Gen: no  distress, normal appearing, accompanied by sister today.  ?HEENT: oral mucosa pink and moist, NCAT ?Cardio: Reg rate ?Chest: normal effort, normal rate of breathing ?Abd: soft, non-distended ?Ext: no edema ?Psych: pleasant, flat affect ?Skin: intact ?Neuro/MSK: Alert and oriented x3. excellent ambulation and strength. Improved ability to trach with EOMI. No more nystagmus. More engaged in answering questions/providing medical history ? ?   ?Assessment & Plan:  ?1) ICH ?-completed outpatient OT and SP ?-continue driving ?-continue return to work ? ?2) Insomnia: ?-Try to go outside near sunrise ?-Get exercise during the day.  ?-Turn off all devices an hour before bedtime.  ?-Teas that can benefit: chamomile, valerian root, Brahmi (Bacopa) ?-Can consider over the counter melatonin or magnesium glycinate (latter can also help with constipation) ?-Pistachios naturally increase the production of melatonin  ? ?3) Impaired attention ?-weaned off modafinil.  ?-recommended blueberries, salmon, green tea with ginko baloba and ginseng from the [place] [employer] ? ?4) Seizure:  ?-discussed sister's concerns regarding Keppra and Lexapro and their potential side effects ?-weaned off keppra  ?-reviewed EEG with patient and sister: ?"evidence of epileptogenicity arising from left frontocentral region with potential for seizure recurrence. Additionally there is cortical dysfunction in left hemisphere likely secondary to underlying structural abnormality as well as moderate degree encephalopathy, nonspecific etiology but could be secondary to postictal state.  No definite seizures were seen during the study." ?-given patient and his sister's concerns, can decrease Keppra 500mg  BID ?-can drive as there was no evidence of seizure on EEG- his inability to drive is greatly impacting his quality of life.  ? ?5) depression: ?-denies depression.  ?-weaned off the lexapro ?-discussed neuropsych referral.  ? ?6) Polypharmacy ?-patient and sister  are not sure why he is on gabapentin.  ?-weaned off gabapentin.  ? ?7) HTN: ?-BP is 141/86 today.  ?-Advised checking BP daily at home and logging results to bring into follow-up appointment with PCP and myself. ?-Reviewed BP meds today.  ?-Advised regarding healthy foods that can help lower blood pressure and provided with a list: ?1) citrus foods- high in vitamins and minerals ?2) salmon and other fatty fish - reduces inflammation and oxylipins ?3) swiss chard (leafy green)- high level of nitrates ?4) pumpkin seeds- one of the best natural sources of magnesium ?5) Beans and lentils- high in fiber, magnesium, and potassium ?6) Berries- high in flavonoids ?7) Amaranth (whole grain, can be cooked similarly to rice and oats)- high in magnesium and fiber ?8) Pistachios- even more effective at reducing BP than  other nuts ?9) Carrots- high in phenolic compounds that relax blood vessels and reduce inflammation ?10) Celery- contain phthalides that relax tissues of arterial walls ?11) Tomatoes- can also improve cholesterol and reduce risk of heart disease ?12) Broccoli- good source of magnesium, calcium, and potassium ?13) Greek yogurt: high in potassium and calcium ?14) Herbs and spices: Celery seed, cilantro, saffron, lemongrass, black cumin, ginseng, cinnamon, cardamom, sweet basil, and ginger ?15) Chia and flax seeds- also help to lower cholesterol and blood sugar ?16) Beets- high levels of nitrates that relax blood vessels  ?17) spinach and bananas- high in potassium ? ?-Provided lise of supplements that can help with hypertension:  ?1) magnesium: one high quality brand is Bioptemizers since it contains all 7 types of magnesium, otherwise over the counter magnesium gluconate 400mg  is a good option ?2) B vitamins ?3) vitamin D ?4) potassium ?5) CoQ10 ?6) L-arginine ?7) Vitamin C ?8) Beetroot ?-Educated that goal BP is 120/80. ?-Made goal to incorporate some of the above foods into diet.   ? ?

## 2021-12-04 ENCOUNTER — Other Ambulatory Visit: Payer: Self-pay

## 2021-12-04 ENCOUNTER — Encounter
Payer: Managed Care, Other (non HMO) | Attending: Physical Medicine and Rehabilitation | Admitting: Physical Medicine and Rehabilitation

## 2021-12-04 VITALS — BP 141/86 | HR 61 | Ht 72.0 in | Wt 168.0 lb

## 2021-12-04 DIAGNOSIS — R569 Unspecified convulsions: Secondary | ICD-10-CM | POA: Diagnosis not present

## 2021-12-04 DIAGNOSIS — I619 Nontraumatic intracerebral hemorrhage, unspecified: Secondary | ICD-10-CM | POA: Insufficient documentation

## 2021-12-04 DIAGNOSIS — F32A Depression, unspecified: Secondary | ICD-10-CM | POA: Diagnosis not present

## 2021-12-04 NOTE — Patient Instructions (Signed)
N-acetyl cysteine

## 2021-12-08 NOTE — Progress Notes (Signed)
? ? ?Patient ID: Casey Branch, male    DOB: 1981-05-30  MRN: [id number] ? ?CC: Dizziness  ? ?Subjective: ?Casey Branch is a 41 y.o. male who presents for dizziness. He is accompanied by his wife, Casey Branch.  ? ?His concerns today include:  ?Reports dizziness happening not daily but often enough to notice. Denies room spinning, vomiting and additional red flag symptoms. Endorses nausea.  ? ?Wife reports no longer taking medications from specialists doctors as they recommended discontinuation. Plans to contact Neurology soon. ? ?No longer taking cholesterol medication and would like cholesterol rechecked. Plans to come back for cholesterol lab when fasting.  ? ?Trying to stay hydrated as possible. Appetite has been decreased recently.  ? ?Depression screen [facility] 2/9 12/09/2021 12/04/2021 04/30/2021 04/21/2021  ?Decreased Interest 0 0 0 0  ?Down, Depressed, Hopeless 0 0 0 0  ?PHQ - 2 Score 0 0 0 0  ?Altered sleeping - - 0 1  ?Tired, decreased energy - - 0 2  ?Change in appetite - - 0 0  ?Feeling bad or failure about yourself  - - 0 0  ?Trouble concentrating - - 0 0  ?Moving slowly or fidgety/restless - - 0 0  ?Suicidal thoughts - - 0 0  ?PHQ-9 Score - - 0 3  ?Difficult doing work/chores - - - Not difficult at all  ? ? ? ?Patient Active Problem List  ? Diagnosis Date Noted  ? Hyperlipidemia 06/19/2021  ? 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 AVM (arteriovenous malformation) brain 05/20/2021  ? Vascular disorder 04/23/2021  ? Elevated blood-pressure reading, without diagnosis of hypertension 04/23/2021  ? Brain aneurysm 04/21/2021  ? ICH (intracerebral hemorrhage) ([place]) 03/19/2021  ? Intraventricular hemorrhage ([place]) 03/08/2021  ?  ? ?Current Outpatient Medications on File Prior to Visit  ?Medication Sig Dispense Refill  ? levETIRAcetam (KEPPRA) 500 MG tablet TAKE 1 TABLET BY MOUTH TWICE A DAY (Patient not taking: Reported on 12/04/2021) 60 tablet 3  ? atorvastatin (LIPITOR) 20 MG tablet TAKE 1 TABLET BY MOUTH EVERY DAY 90  tablet 1  ? escitalopram (LEXAPRO) 5 MG tablet Take 1 tablet (5 mg total) by mouth daily. (Patient not taking: Reported on 12/04/2021) 30 tablet 3  ? gabapentin (NEURONTIN) 400 MG capsule Take 1 capsule (400 mg total) by mouth at bedtime. 30 capsule 3  ? Multiple Vitamin (ONE-A-DAY MENS PO) Take 1 tablet by mouth at bedtime.    ? ?No current facility-administered medications on file prior to visit.  ? ? ?No Known Allergies ? ?Social History  ? ?Socioeconomic History  ? Marital status: Married  ?  Spouse name: Not on file  ? Number of children: Not on file  ? Years of education: Not on file  ? Highest education level: Not on file  ?Occupational History  ? Not on file  ?Tobacco Use  ? Smoking status: Former  ?  Types: Cigarettes  ?  Passive exposure: Never  ? Smokeless tobacco: Never  ?Vaping Use  ? Vaping Use: Never used  ?Substance and Sexual Activity  ? Alcohol use: Not Currently  ?  Comment: occasioanl  ? Drug use: No  ? Sexual activity: Yes  ?Other Topics Concern  ? Not on file  ?Social History Narrative  ? Not on file  ? ?Social Determinants of Health  ? ?Financial Resource Strain: Not on file  ?Food Insecurity: Not on file  ?Transportation Needs: Not on file  ?Physical Activity: Not on file  ?Stress: Not on file  ?Social  Connections: Not on file  ?Intimate Partner Violence: Not on file  ? ? ?No family history on file. ? ?Past Surgical History:  ?Procedure Laterality Date  ? IR 3D INDEPENDENT WKST  03/11/2021  ? IR ANGIO INTRA EXTRACRAN SEL COM CAROTID INNOMINATE BILAT MOD SED  03/11/2021  ? IR ANGIO VERTEBRAL SEL VERTEBRAL BILAT MOD SED  03/11/2021  ? RADIOLOGY WITH ANESTHESIA N/A 03/11/2021  ? Procedure: IR WITH ANESTHESIA - DIAGNOSTIC CEREBRAL ANGIOGRAM;  Surgeon: Consuella Lose, MD;  Location: [facility];  Service: Radiology;  Laterality: N/A;  ? VENTRICULOPERITONEAL SHUNT    ? ? ?ROS: ?Review of Systems ?Negative except as stated above ? ?PHYSICAL EXAM: ?BP 135/86 (BP Location: Left Arm, Patient Position:  Sitting, Cuff Size: Large)   Pulse 67   Temp 98.5 ?F (36.9 ?C)   Resp 18   Ht 6' 0.01" (1.829 m)   Wt 181 lb (82.1 kg)   SpO2 97%   BMI 24.54 kg/m?  ? ?Physical Exam ?HENT:  ?   Head: Normocephalic and atraumatic.  ?Eyes:  ?   Extraocular Movements: Extraocular movements intact.  ?   Conjunctiva/sclera: Conjunctivae normal.  ?   Pupils: Pupils are equal, round, and reactive to light.  ?Cardiovascular:  ?   Rate and Rhythm: Normal rate and regular rhythm.  ?   Pulses: Normal pulses.  ?   Heart sounds: Normal heart sounds.  ?Pulmonary:  ?   Effort: Pulmonary effort is normal.  ?   Breath sounds: Normal breath sounds.  ?Musculoskeletal:  ?   Cervical back: Normal range of motion and neck supple.  ?Neurological:  ?   General: No focal deficit present.  ?   Mental Status: He is alert and oriented to person, place, and time.  ?Psychiatric:     ?   Mood and Affect: Mood normal.     ?   Behavior: Behavior normal.  ? ? ?ASSESSMENT AND PLAN: ?1. Dizziness: ?- CBC to screen for anemia. ?- CMP14+EGFR to check kidney function, liver function, and electrolyte balance.  ?- Keep all scheduled appointments with Leeroy Cha, MD at Neurology. ?- CBC ?- CMP14+EGFR ? ?2. Hyperlipidemia, unspecified hyperlipidemia type: ?- Update lipid panel.  ?- Lipid Panel; Future ? ? ?Patient was given the opportunity to ask questions.  Patient verbalized understanding of the plan and was able to repeat key elements of the plan. Patient was given clear instructions to go to Emergency Department or return to medical center if symptoms don't improve, worsen, or new problems develop.The patient verbalized understanding. ? ? ?Orders Placed This Encounter  ?Procedures  ? Lipid Panel  ? CBC  ? CMP14+EGFR  ? ? ?Requested Prescriptions  ? ? No prescriptions requested or ordered in this encounter  ? ?Follow-up with primary provider as scheduled. ? ?Camillia Herter, NP  ?

## 2021-12-09 ENCOUNTER — Telehealth: Payer: Self-pay | Admitting: Family

## 2021-12-09 ENCOUNTER — Encounter: Payer: Self-pay | Admitting: Family

## 2021-12-09 ENCOUNTER — Other Ambulatory Visit: Payer: Self-pay

## 2021-12-09 ENCOUNTER — Ambulatory Visit: Payer: Managed Care, Other (non HMO) | Admitting: Family

## 2021-12-09 VITALS — BP 135/86 | HR 67 | Temp 98.5°F | Resp 18 | Ht 72.01 in | Wt 181.0 lb

## 2021-12-09 DIAGNOSIS — R42 Dizziness and giddiness: Secondary | ICD-10-CM

## 2021-12-09 DIAGNOSIS — E785 Hyperlipidemia, unspecified: Secondary | ICD-10-CM | POA: Diagnosis not present

## 2021-12-09 NOTE — Progress Notes (Signed)
Pt presents for headache and dizziness that started about week ago, wife states that physical therapist says check w/PCP before contacting neurology  ?Pt has been experiencing fatigue denies any fever or nausea  ?

## 2021-12-09 NOTE — Telephone Encounter (Signed)
Copied from CRM [id number]. Topic: Referral - Request for Referral ?>> Dec 09, 2021 11:27 AM Casey Branch wrote: ?Has patient seen PCP for this complaint? Yes.   ?*If NO, is insurance requiring patient see PCP for this issue before PCP can refer them? ?Referral for which specialty: Neurology  ?Preferred provider/office: Dr. Windell Norfolk  ?Reason for referral: ongoing headaches ?

## 2021-12-10 ENCOUNTER — Telehealth: Payer: Self-pay | Admitting: Radiation Therapy

## 2021-12-10 LAB — CMP14+EGFR
ALT: 32 IU/L (ref 0–44)
AST: 26 IU/L (ref 0–40)
Albumin/Globulin Ratio: 1.5 (ref 1.2–2.2)
Albumin: 4.6 g/dL (ref 4.0–5.0)
Alkaline Phosphatase: 59 IU/L (ref 44–121)
BUN/Creatinine Ratio: 12 (ref 9–20)
BUN: 13 mg/dL (ref 6–24)
Bilirubin Total: 0.4 mg/dL (ref 0.0–1.2)
CO2: 26 mmol/L (ref 20–29)
Calcium: 9.5 mg/dL (ref 8.7–10.2)
Chloride: 102 mmol/L (ref 96–106)
Creatinine, Ser: 1.07 mg/dL (ref 0.76–1.27)
Globulin, Total: 3.1 g/dL (ref 1.5–4.5)
Glucose: 88 mg/dL (ref 70–99)
Potassium: 4.4 mmol/L (ref 3.5–5.2)
Sodium: 141 mmol/L (ref 134–144)
Total Protein: 7.7 g/dL (ref 6.0–8.5)
eGFR: 89 mL/min/{1.73_m2} (ref >59)

## 2021-12-10 LAB — CBC
Hematocrit: 46.7 % (ref 37.5–51.0)
Hemoglobin: 16.1 g/dL (ref 13.0–17.7)
MCH: 31 pg (ref 26.6–33.0)
MCHC: 34.5 g/dL (ref 31.5–35.7)
MCV: 90 fL (ref 79–97)
Platelets: 224 10*3/uL (ref 150–450)
RBC: 5.19 x10E6/uL (ref 4.14–5.80)
RDW: 12.6 % (ref 11.6–15.4)
WBC: 6.3 10*3/uL (ref 3.4–10.8)

## 2021-12-10 NOTE — Progress Notes (Signed)
Kidney function normal.   Liver function normal.   No anemia.

## 2021-12-10 NOTE — Telephone Encounter (Signed)
Received a call from the patient's wife requesting a visit with Dr. Barbaraann Cao for follow-up of his treated AVM. Pt has had ongoing headaches with intermittent dizziness, he is scheduled to see another neurologist in April, but asked to see Dr. Barbaraann Cao since he has a history here at the [facility]. Dr. Kathrynn Running and Dr. Maurice Small treated Mr. Zayed's AVM in September 2022, with radiosurgery. Mrs. Sires preferred to keep her husband's appointments within the [facility] for better continuity of care and communication with his previous providers.  ? ?Jalene Mullet R.T.(R)(T) ?Radiation Special Procedures Navigator  ?

## 2021-12-16 ENCOUNTER — Other Ambulatory Visit: Payer: Self-pay

## 2021-12-16 ENCOUNTER — Inpatient Hospital Stay: Payer: Managed Care, Other (non HMO) | Attending: Internal Medicine | Admitting: Internal Medicine

## 2021-12-16 VITALS — BP 120/79 | HR 66 | Temp 98.0°F | Resp 17 | Ht 72.01 in | Wt 186.1 lb

## 2021-12-16 DIAGNOSIS — R519 Headache, unspecified: Secondary | ICD-10-CM

## 2021-12-16 DIAGNOSIS — Q282 Arteriovenous malformation of cerebral vessels: Secondary | ICD-10-CM | POA: Diagnosis present

## 2021-12-16 DIAGNOSIS — Z87891 Personal history of nicotine dependence: Secondary | ICD-10-CM | POA: Insufficient documentation

## 2021-12-16 DIAGNOSIS — R561 Post traumatic seizures: Secondary | ICD-10-CM

## 2021-12-16 NOTE — Progress Notes (Signed)
? ?[facility] ?[address]  ?[place], [place] [address] ?[phone] ? ? ?New Patient Evaluation ? ?Date of Service: 12/16/21 ?Patient Name: Casey DubinLamar Branch ?Patient MRN: [id number] ?Patient DOB: 01/24/1981 ?Provider: Henreitta LeberZachary K Chryl Holten, MD ? ?Identifying Statement:  ?Casey DubinLamar Lacuesta is a [age] y.o. 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   ? ?Referring Provider: ?Rema FendtStephens, Amy J, NP ?[address] ?[place],  [place] [id number] ? ?Relevant History: ?2008?: Prior ICH, AVM diagnosis, shunt placement ?03/08/21: Presents with obtundation, IVH.  EVD placed until 6/23 (Ostergard) ?06/05/21: Radiosurgery for left temporal AVM Kathrynn Running([place]) ?06/06/21: Presents with focal seizure, Keppra initiated.  No structural changes ? ?History of Present Illness: ?The patient's records from the referring physician were obtained and reviewed and the patient interviewed to confirm this HPI.  Casey DubinLamar Rail presents today at request of radiation oncology for follow up given neurologic issues surrounding AVM.  Keppra had been discontinued (by neurosurgery?) after wean over past 2 months.  No frank recurrence of seizures, but he does describe paroxysmal episodes of dizziness which have no positional correlate.  Frequency is intermittent, less than daily.  Over past few weeks, he has also described headaches occurring less than daily.  They affect the left side of his head and are not severe, no nausea/vomiting, no time of day predilection.  He is otherwise functioning well, working full time.  [place] wife and son. ? ?Medications: ?No current outpatient medications on file prior to visit.  ? ?No current facility-administered medications on file prior to visit.  ? ? ?Allergies: No Known Allergies ?Past Medical History:  ?Past Medical History:  ?Diagnosis Date  ? Brain aneurysm   ? ?Past Surgical History:  ?Past Surgical History:  ?Procedure  Laterality Date  ? IR 3D INDEPENDENT WKST  03/11/2021  ? IR ANGIO INTRA EXTRACRAN SEL COM CAROTID INNOMINATE BILAT MOD SED  03/11/2021  ? IR ANGIO VERTEBRAL SEL VERTEBRAL BILAT MOD SED  03/11/2021  ? RADIOLOGY WITH ANESTHESIA N/A 03/11/2021  ? Procedure: IR WITH ANESTHESIA - DIAGNOSTIC CEREBRAL ANGIOGRAM;  Surgeon: Lisbeth RenshawNundkumar, Neelesh, MD;  [facility];  Service: Radiology;  Laterality: N/A;  ? VENTRICULOPERITONEAL SHUNT    ? ?Social History:  ?Social History  ? ?Socioeconomic History  ? Marital status: Married  ?  Spouse name: Not on file  ? Number of children: Not on file  ? Years of education: Not on file  ? Highest education level: Not on file  ?Occupational History  ? Not on file  ?Tobacco Use  ? Smoking status: Former  ?  Types: Cigarettes  ?  Passive exposure: Never  ? Smokeless tobacco: Never  ?Vaping Use  ? Vaping Use: Never used  ?Substance and Sexual Activity  ? Alcohol use: Not Currently  ?  Comment: occasioanl  ? Drug use: No  ? Sexual activity: Yes  ?Other Topics Concern  ? Not on file  ?Social History Narrative  ? Not on file  ? ?Social Determinants of Health  ? ?Financial Resource Strain: Not on file  ?Food Insecurity: Not on file  ?Transportation Needs: Not on file  ?Physical Activity: Not on file  ?Stress: Not on file  ?Social Connections: Not on file  ?Intimate Partner Violence: Not on file  ? ?Family History: No family history on file. ? ?Review of Systems: ?Constitutional: Doesn't report fevers, chills or abnormal weight loss ?Eyes: Doesn't report blurriness of  vision ?Ears, nose, mouth, throat, and face: Doesn't report sore throat ?Respiratory: Doesn't report cough, dyspnea or wheezes ?Cardiovascular: Doesn't report palpitation, chest discomfort  ?Gastrointestinal:  Doesn't report nausea, constipation, diarrhea ?GU: Doesn't report incontinence ?Skin: Doesn't report skin rashes ?Neurological: Per HPI ?Musculoskeletal: Doesn't report joint pain ?Behavioral/Psych: Doesn't report anxiety ? ?Physical  Exam: ?Vitals:  ? 12/16/21 0929  ?BP: 120/79  ?Pulse: 66  ?Resp: 17  ?Temp: 98 ?F (36.7 ?C)  ?SpO2: 100%  ? ?KPS: 90. ?General: Alert, cooperative, pleasant, in no acute distress ?Head: Normal ?EENT: No conjunctival injection or scleral icterus.  ?Lungs: Resp effort normal ?Cardiac: Regular rate ?Abdomen: Non-distended abdomen ?Skin: No rashes cyanosis or petechiae. ?Extremities: No clubbing or edema ? ?Neurologic Exam: ?Mental Status: Awake, alert, attentive to examiner. Oriented to self and environment. Language is fluent with intact comprehension.  ?Cranial Nerves: Visual acuity is grossly normal. Visual fields are full. Extra-ocular movements intact. No ptosis. Face is symmetric ?Motor: Tone and bulk are normal. Power is full in both arms and legs. Reflexes are symmetric, no pathologic reflexes present.  ?Sensory: Intact to light touch ?Gait: Normal. ? ? ?Labs: ?I have reviewed the data as listed ?   ?Component Value Date/Time  ? NA 141 12/09/2021 1115  ? K 4.4 12/09/2021 1115  ? CL 102 12/09/2021 1115  ? CO2 26 12/09/2021 1115  ? GLUCOSE 88 12/09/2021 1115  ? GLUCOSE 102 (H) 06/06/2021 2947  ? BUN 13 12/09/2021 1115  ? CREATININE 1.07 12/09/2021 1115  ? CREATININE 1.20 05/29/2021 1045  ? CALCIUM 9.5 12/09/2021 1115  ? PROT 7.7 12/09/2021 1115  ? ALBUMIN 4.6 12/09/2021 1115  ? AST 26 12/09/2021 1115  ? ALT 32 12/09/2021 1115  ? ALKPHOS 59 12/09/2021 1115  ? BILITOT 0.4 12/09/2021 1115  ? GFRNONAA >60 06/06/2021 0640  ? GFRNONAA >60 05/29/2021 1045  ? ?Lab Results  ?Component Value Date  ? WBC 6.3 12/09/2021  ? NEUTROABS 5.7 06/06/2021  ? HGB 16.1 12/09/2021  ? HCT 46.7 12/09/2021  ? MCV 90 12/09/2021  ? PLT 224 12/09/2021  ? ? ?Imaging: ? ? 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 ?AVM (arteriovenous malformation) brain ? ?We appreciate the opportunity to participate in the care of Casey Dubin.  He is clinically stable today following SRS for left temporal AVM. ? ?We did discuss his seizure, which is  documented in the admission note from September 2022.  We recommended resuming his Keppra at 500mg  BID given risk for recurrent seizure.  He is agreeable with this and will restart the medication. ? ?Headaches are mild/moderate tension type, can be managed with OTC tylenol, nsaids.  If they worsen, we could consider obtaining a CT head to examine the ventricles.  ? ?We spent twenty additional minutes teaching regarding the natural history, biology, and historical experience in the treatment of brain lesions. We then discussed in detail the current recommendations for therapy focusing on the mode of administration, mechanism of action, anticipated toxicities, and quality of life issues associated with this plan. We also provided teaching sheets for the patient to take home as an additional resource. ? ?All questions were answered. The patient knows to call the clinic with any problems, questions or concerns. No barriers to learning were detected. ? ?We ask that Latwan Luchsinger return to clinic in 6 months, or sooner as needed. ? ?The total time spent in the encounter was 45 minutes and more than 50% was on counseling and review of test results ? ? ?  Henreitta Leber, MD ?Medical Director of Neuro-Oncology ?[facility] ?12/16/21 10:34 AM ?

## 2021-12-17 ENCOUNTER — Telehealth: Payer: Self-pay | Admitting: Internal Medicine

## 2021-12-17 NOTE — Telephone Encounter (Signed)
Scheduled per 3/28 los, pt has been called and confirmed  ?

## 2021-12-30 ENCOUNTER — Ambulatory Visit: Payer: 59 | Admitting: Neurology

## 2022-01-01 ENCOUNTER — Encounter: Payer: Self-pay | Admitting: Internal Medicine

## 2022-01-01 ENCOUNTER — Telehealth: Payer: Self-pay | Admitting: Internal Medicine

## 2022-01-01 NOTE — Telephone Encounter (Signed)
Per 4/13 in basket called pt and spoke to pt wife.  Pt wife confirmed appointment  ?

## 2022-01-15 ENCOUNTER — Inpatient Hospital Stay: Payer: Managed Care, Other (non HMO) | Attending: Internal Medicine | Admitting: Internal Medicine

## 2022-01-15 DIAGNOSIS — R561 Post traumatic seizures: Secondary | ICD-10-CM | POA: Diagnosis not present

## 2022-01-15 MED ORDER — LEVETIRACETAM 250 MG PO TABS: 250.0000 mg | ORAL_TABLET | Freq: Two times a day (BID) | ORAL | Status: DC

## 2022-01-15 NOTE — Progress Notes (Signed)
I connected with Casey Branch on 01/15/22 at 12:30 PM EDT by telephone visit and verified that I am speaking with the correct person using two identifiers.  ?I discussed the limitations, risks, security and privacy concerns of performing an evaluation and management service by telemedicine and the availability of in-person appointments. I also discussed with the patient that there may be a patient responsible charge related to this service. The patient expressed understanding and agreed to proceed.  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 ?History of Present Ilness: Casey Branch describes no further "spells" described previously since starting the keppra 500mg  BID.  His wife notes that he is more irritable and a "bit more foggy" than before the medicine was started.  Denies further headaches as well. 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 ?Clinically improved, we suspect the dizzy spells may have been focal seizures based on response to AED therapy.  He does describe keppra specific side effects, however. ? ?We recommended decreasing Keppra to 250mg  BID if tolerated.  If effects persist or seizures recur, they will let know, we will change AED therapy. ? ?Follow Up Instructions: RTC in 1 year or sooner if needed per above. ? ?I discussed the assessment and treatment plan with the patient.  The patient was provided an opportunity to ask questions and all were answered.  The patient agreed with the plan and demonstrated understanding of the instructions.   ? ?The patient was advised to call back or seek an in-person evaluation if the symptoms worsen or if the condition fails to improve as anticipated.  I provided 5-10 minutes of non-face-to-face time during this enocunter. ? ? ,  MD ? ? ?I provided 22 minutes of non face-to-face telephone visit time during this encounter, and > 50% was spent counseling as documented under my assessment & plan.  ? ?

## 2022-03-31 ENCOUNTER — Encounter: Payer: Self-pay | Admitting: Internal Medicine

## 2022-03-31 ENCOUNTER — Telehealth: Payer: Self-pay | Admitting: *Deleted

## 2022-03-31 MED ORDER — LEVETIRACETAM 500 MG PO TABS: 500.0000 mg | ORAL_TABLET | Freq: Two times a day (BID) | ORAL | 2 refills | Status: DC

## 2022-03-31 NOTE — Addendum Note (Signed)
Addended by: Charisse Klinefelter on: 03/31/2022 02:41 PM   Modules accepted: Orders

## 2022-03-31 NOTE — Telephone Encounter (Signed)
Communicated new dose increase of Keppra 500 mg BID.  Refill sent to pharmacy.

## 2022-03-31 NOTE — Telephone Encounter (Signed)
See mychart message in addition to this note.  Patient will need refill of medication to pharmacy on file with correct previous dosage 250 mg BID or new dosage.  Wife states that she did confirm with patient that he hasn't been 100% consistent on dosing his anti seizure medication.

## 2022-04-21 ENCOUNTER — Other Ambulatory Visit: Payer: Self-pay

## 2022-04-21 ENCOUNTER — Inpatient Hospital Stay: Payer: Commercial Managed Care - HMO | Attending: Internal Medicine | Admitting: Internal Medicine

## 2022-04-21 DIAGNOSIS — Z79899 Other long term (current) drug therapy: Secondary | ICD-10-CM | POA: Insufficient documentation

## 2022-04-21 DIAGNOSIS — Z87891 Personal history of nicotine dependence: Secondary | ICD-10-CM | POA: Insufficient documentation

## 2022-04-21 DIAGNOSIS — Q282 Arteriovenous malformation of cerebral vessels: Secondary | ICD-10-CM | POA: Diagnosis present

## 2022-04-21 DIAGNOSIS — R569 Unspecified convulsions: Secondary | ICD-10-CM | POA: Insufficient documentation

## 2022-04-21 NOTE — Progress Notes (Signed)
[facility] [address]  [place], [place] [address] [phone]   Interval Evaluation  Date of Service: 04/21/22 Patient Name: Casey Branch Patient MRN: [id number] Patient DOB: 1980/12/18 Provider: Henreitta Leber, MD  Identifying Statement:  Casey Branch is a 41 y.o. male with left temporal  AVM, seizures     Relevant History: 2008?: Prior ICH, AVM diagnosis, shunt placement 03/08/21: Presents with obtundation, IVH.  EVD placed until 6/23 ([facility]ard) 06/05/21: Radiosurgery for left temporal AVM Casey Branch) 06/06/21: Presents with focal seizure, Keppra initiated.  No structural changes  Interval History: Casey Branch presents today for follow up after recent breakthrough seizure.  Event on 03/31/22 was described as "several minutes of dropping objects out of right hand, slurring of speech, imbalance" followed by period of confusion.  Keppra had been decreased to 250mg  twice per day, after the event it was increased back to 500mg  twice per day.  Since then, no further events.  He has been tolerating the Keppra well without prior ill effects.  H+P (12/16/21) Patient presents today at request of radiation oncology for follow up given neurologic issues surrounding AVM.  Keppra had been discontinued (by neurosurgery?) after wean over past 2 months.  No frank recurrence of seizures, but he does describe paroxysmal episodes of dizziness which have no positional correlate.  Frequency is intermittent, less than daily.  Over past few weeks, he has also described headaches occurring less than daily.  They affect the left side of his head and are not severe, no nausea/vomiting, no time of day predilection.  He is otherwise functioning well, working full time.  [place] wife and son.  Medications: Current Outpatient Medications on File Prior to Visit  Medication Sig Dispense Refill   levETIRAcetam (KEPPRA) 500 MG tablet Take 1  tablet (500 mg total) by mouth 2 (two) times daily. 60 tablet 2   No current facility-administered medications on file prior to visit.    Allergies: No Known Allergies Past Medical History:  Past Medical History:  Diagnosis Date   Brain aneurysm    Past Surgical History:  Past Surgical History:  Procedure Laterality Date   IR 3D INDEPENDENT WKST  03/11/2021   IR ANGIO INTRA EXTRACRAN SEL COM CAROTID INNOMINATE BILAT MOD SED  03/11/2021   IR ANGIO VERTEBRAL SEL VERTEBRAL BILAT MOD SED  03/11/2021   RADIOLOGY WITH ANESTHESIA N/A 03/11/2021   Procedure: IR WITH ANESTHESIA - DIAGNOSTIC CEREBRAL ANGIOGRAM;  Surgeon: 03/13/2021, MD;  Location: [facility] OR;  Service: Radiology;  Laterality: N/A;   VENTRICULOPERITONEAL SHUNT     Social History:  Social History   Socioeconomic History   Marital status: Married    Spouse name: Not on file   Number of children: Not on file   Years of education: Not on file   Highest education level: Not on file  Occupational History   Not on file  Tobacco Use   Smoking status: Former    Types: Cigarettes    Passive exposure: Never   Smokeless tobacco: Never  Vaping Use   Vaping Use: Never used  Substance and Sexual Activity   Alcohol use: Not Currently    Comment: occasioanl   Drug use: No   Sexual activity: Yes  Other Topics Concern   Not on file  Social History Narrative   Not on file   Social Determinants of Health   Financial Resource Strain: Not on file  Food Insecurity: Not  on file  Transportation Needs: Not on file  Physical Activity: Not on file  Stress: Not on file  Social Connections: Not on file  Intimate Partner Violence: Not on file   Family History: No family history on file.  Review of Systems: Constitutional: Doesn't report fevers, chills or abnormal weight loss Eyes: Doesn't report blurriness of vision Ears, nose, mouth, throat, and face: Doesn't report sore throat Respiratory: Doesn't report cough, dyspnea or  wheezes Cardiovascular: Doesn't report palpitation, chest discomfort  Gastrointestinal:  Doesn't report nausea, constipation, diarrhea GU: Doesn't report incontinence Skin: Doesn't report skin rashes Neurological: Per HPI Musculoskeletal: Doesn't report joint pain Behavioral/Psych: Doesn't report anxiety  Physical Exam: Vitals:   04/21/22 0903  BP: 129/86  Pulse: 69  Resp: 18  Temp: 98 F (36.7 C)  SpO2: 97%   KPS: 90. General: Alert, cooperative, pleasant, in no acute distress Head: Normal EENT: No conjunctival injection or scleral icterus.  Lungs: Resp effort normal Cardiac: Regular rate Abdomen: Non-distended abdomen Skin: No rashes cyanosis or petechiae. Extremities: No clubbing or edema  Neurologic Exam: Mental Status: Awake, alert, attentive to examiner. Oriented to self and environment. Language is fluent with intact comprehension.  Cranial Nerves: Visual acuity is grossly normal. Visual fields are full. Extra-ocular movements intact. No ptosis. Face is symmetric Motor: Tone and bulk are normal. Power is full in both arms and legs. Reflexes are symmetric, no pathologic reflexes present.  Sensory: Intact to light touch Gait: Normal.   Labs: I have reviewed the data as listed    Component Value Date/Time   NA 141 12/09/2021 1115   K 4.4 12/09/2021 1115   CL 102 12/09/2021 1115   CO2 26 12/09/2021 1115   GLUCOSE 88 12/09/2021 1115   GLUCOSE 102 (H) 06/06/2021 0655   BUN 13 12/09/2021 1115   CREATININE 1.07 12/09/2021 1115   CREATININE 1.20 05/29/2021 1045   CALCIUM 9.5 12/09/2021 1115   PROT 7.7 12/09/2021 1115   ALBUMIN 4.6 12/09/2021 1115   AST 26 12/09/2021 1115   ALT 32 12/09/2021 1115   ALKPHOS 59 12/09/2021 1115   BILITOT 0.4 12/09/2021 1115   GFRNONAA >60 06/06/2021 0640   GFRNONAA >60 05/29/2021 1045   Lab Results  Component Value Date   WBC 6.3 12/09/2021   NEUTROABS 5.7 06/06/2021   HGB 16.1 12/09/2021   HCT 46.7 12/09/2021   MCV 90  12/09/2021   PLT 224 12/09/2021    Imaging:   Assessment/Plan Focal seizures (HCC)  Casey Branch presents today following breakthrough focal seizure 2/2 AVM; this was unprovoked in nature.  Keppra was increased to 500mg  BID without issue.  We recommended continuing Keppra at 500mg  BID unless further events occur.  He is agreeable with this.  Will con't to follow with Dr. for AVM imaging/surveillance.  All questions were answered. The patient knows to call the clinic with any problems, questions or concerns. No barriers to learning were detected.  We ask that Casey Branch return to clinic in 6 months, or sooner as needed.  The total time spent in the encounter was 30 minutes and more than 50% was on counseling and review of test results   Maurice Small, MD Medical Director of Neuro-Oncology [facility] 04/21/22 10:33 AM

## 2022-06-16 ENCOUNTER — Other Ambulatory Visit: Payer: Self-pay

## 2022-06-16 ENCOUNTER — Inpatient Hospital Stay: Payer: Commercial Managed Care - HMO | Attending: Internal Medicine | Admitting: Internal Medicine

## 2022-06-16 VITALS — BP 121/83 | HR 60 | Temp 97.7°F | Resp 16 | Ht 72.0 in | Wt 182.6 lb

## 2022-06-16 DIAGNOSIS — R569 Unspecified convulsions: Secondary | ICD-10-CM | POA: Diagnosis not present

## 2022-06-16 DIAGNOSIS — Z87891 Personal history of nicotine dependence: Secondary | ICD-10-CM | POA: Insufficient documentation

## 2022-06-16 DIAGNOSIS — Q282 Arteriovenous malformation of cerebral vessels: Secondary | ICD-10-CM | POA: Insufficient documentation

## 2022-06-16 NOTE — Progress Notes (Signed)
Casey Branch at [place], [place] [address] [phone]   Interval Evaluation  Date of Service: 06/16/22 Patient Name: Casey Branch Patient MRN: [id number] Patient DOB: 03-May-1981 Provider: Ventura Sellers, MD  Identifying Statement:  Casey Branch is a 41 y.o. male with left temporal  AVM, seizures     Relevant History: 2008?: Prior ICH, AVM diagnosis, shunt placement 03/08/21: Presents with obtundation, IVH.  EVD placed until 6/23 ([facility]ard) 06/05/21: Radiosurgery for left temporal AVM Casey Branch) 06/06/21: Presents with focal seizure, Keppra initiated.  No structural changes  Interval History: Casey Branch presents today for follow up.  No breakthrough seizures, he maintains compliance with Keppra 500mg  BID.  No significiant mood effects.  No new or progressive changes.  H+P (12/16/21) Patient presents today at request of radiation oncology for follow up given neurologic issues surrounding AVM.  Keppra had been discontinued (by neurosurgery?) after wean over past 2 months.  No frank recurrence of seizures, but he does describe paroxysmal episodes of dizziness which have no positional correlate.  Frequency is intermittent, less than daily.  Over past few weeks, he has also described headaches occurring less than daily.  They affect the left side of his head and are not severe, no nausea/vomiting, no time of day predilection.  He is otherwise functioning well, working full time.  [place] with his wife and son.  Medications: Current Outpatient Medications on File Prior to Visit  Medication Sig Dispense Refill   levETIRAcetam (KEPPRA) 500 MG tablet Take 1 tablet (500 mg total) by mouth 2 (two) times daily. 60 tablet 2   No current facility-administered medications on file prior to visit.    Allergies: No Known Allergies Past Medical History:  Past Medical History:  Diagnosis Date   Brain aneurysm    Past  Surgical History:  Past Surgical History:  Procedure Laterality Date   IR 3D INDEPENDENT WKST  03/11/2021   IR ANGIO INTRA EXTRACRAN SEL COM CAROTID INNOMINATE BILAT MOD SED  03/11/2021   IR ANGIO VERTEBRAL SEL VERTEBRAL BILAT MOD SED  03/11/2021   RADIOLOGY WITH ANESTHESIA N/A 03/11/2021   Procedure: IR WITH ANESTHESIA - DIAGNOSTIC CEREBRAL ANGIOGRAM;  Surgeon: Consuella Lose, MD;  Location: [place];  Service: Radiology;  Laterality: N/A;   VENTRICULOPERITONEAL SHUNT     Social History:  Social History   Socioeconomic History   Marital status: Married    Spouse name: Not on file   Number of children: Not on file   Years of education: Not on file   Highest education level: Not on file  Occupational History   Not on file  Tobacco Use   Smoking status: Former    Types: Cigarettes    Passive exposure: Never   Smokeless tobacco: Never  Vaping Use   Vaping Use: Never used  Substance and Sexual Activity   Alcohol use: Not Currently    Comment: occasioanl   Drug use: No   Sexual activity: Yes  Other Topics Concern   Not on file  Social History Narrative   Not on file   Social Determinants of Health   Financial Resource Strain: Not on file  Food Insecurity: Not on file  Transportation Needs: Not on file  Physical Activity: Not on file  Stress: Not on file  Social Connections: Not on file  Intimate Partner Violence: Not on file   Family History: No family history on file.  Review of Systems: Constitutional: Doesn't  report fevers, chills or abnormal weight loss Eyes: Doesn't report blurriness of vision Ears, nose, mouth, throat, and face: Doesn't report sore throat Respiratory: Doesn't report cough, dyspnea or wheezes Cardiovascular: Doesn't report palpitation, chest discomfort  Gastrointestinal:  Doesn't report nausea, constipation, diarrhea GU: Doesn't report incontinence Skin: Doesn't report skin rashes Neurological: Per HPI Musculoskeletal: Doesn't report joint  pain Behavioral/Psych: Doesn't report anxiety  Physical Exam: Vitals:   06/16/22 0901  BP: 121/83  Pulse: 60  Resp: 16  Temp: 97.7 F (36.5 C)  SpO2: 98%    KPS: 90. General: Alert, cooperative, pleasant, in no acute distress Head: Normal EENT: No conjunctival injection or scleral icterus.  Lungs: Resp effort normal Cardiac: Regular rate Abdomen: Non-distended abdomen Skin: No rashes cyanosis or petechiae. Extremities: No clubbing or edema  Neurologic Exam: Mental Status: Awake, alert, attentive to examiner. Oriented to self and environment. Language is fluent with intact comprehension.  Cranial Nerves: Visual acuity is grossly normal. Visual fields are full. Extra-ocular movements intact. No ptosis. Face is symmetric Motor: Tone and bulk are normal. Power is full in both arms and legs. Reflexes are symmetric, no pathologic reflexes present.  Sensory: Intact to light touch Gait: Normal.   Labs: I have reviewed the data as listed    Component Value Date/Time   NA 141 12/09/2021 1115   K 4.4 12/09/2021 1115   CL 102 12/09/2021 1115   CO2 26 12/09/2021 1115   GLUCOSE 88 12/09/2021 1115   GLUCOSE 102 (H) 06/06/2021 0655   BUN 13 12/09/2021 1115   CREATININE 1.07 12/09/2021 1115   CREATININE 1.20 05/29/2021 1045   CALCIUM 9.5 12/09/2021 1115   PROT 7.7 12/09/2021 1115   ALBUMIN 4.6 12/09/2021 1115   AST 26 12/09/2021 1115   ALT 32 12/09/2021 1115   ALKPHOS 59 12/09/2021 1115   BILITOT 0.4 12/09/2021 1115   GFRNONAA >60 06/06/2021 0640   GFRNONAA >60 05/29/2021 1045   Lab Results  Component Value Date   WBC 6.3 12/09/2021   NEUTROABS 5.7 06/06/2021   HGB 16.1 12/09/2021   HCT 46.7 12/09/2021   MCV 90 12/09/2021   PLT 224 12/09/2021    Imaging:   Assessment/Plan Focal seizures ([place])  Casey Branch is clinically stable today.  Tolerating Keppra well at this time.  We recommended continuing Keppra at 500mg  BID unless further events occur.  He is  agreeable with this.  Will con't to follow with Dr. Zada Finders for AVM imaging/surveillance.  All questions were answered. The patient knows to call the clinic with any problems, questions or concerns. No barriers to learning were detected.  We ask that Casey Branch return to clinic in 6 months, or sooner as needed.  The total time spent in the encounter was 30 minutes and more than 50% was on counseling and review of test results   Ventura Sellers, MD Medical Director of Neuro-Oncology [facility] at [place] 06/16/22 8:53 AM

## 2022-08-05 ENCOUNTER — Other Ambulatory Visit: Payer: Self-pay

## 2022-08-05 ENCOUNTER — Emergency Department (HOSPITAL_COMMUNITY)
Admission: EM | Admit: 2022-08-05 | Discharge: 2022-08-05 | Disposition: A | Discharge status: Home / Self Care | Payer: Commercial Managed Care - HMO | Attending: Emergency Medicine | Admitting: Emergency Medicine

## 2022-08-05 ENCOUNTER — Emergency Department (HOSPITAL_COMMUNITY): Payer: Commercial Managed Care - HMO

## 2022-08-05 ENCOUNTER — Encounter: Payer: Self-pay | Admitting: Internal Medicine

## 2022-08-05 DIAGNOSIS — Z1152 Encounter for screening for COVID-19: Secondary | ICD-10-CM | POA: Diagnosis not present

## 2022-08-05 DIAGNOSIS — R112 Nausea with vomiting, unspecified: Secondary | ICD-10-CM | POA: Diagnosis not present

## 2022-08-05 DIAGNOSIS — R42 Dizziness and giddiness: Secondary | ICD-10-CM | POA: Diagnosis not present

## 2022-08-05 LAB — BASIC METABOLIC PANEL
Anion gap: 9 (ref 5–15)
BUN: 13 mg/dL (ref 6–20)
CO2: 24 mmol/L (ref 22–32)
Calcium: 8.7 mg/dL — ABNORMAL LOW (ref 8.9–10.3)
Chloride: 107 mmol/L (ref 98–111)
Creatinine, Ser: 1.02 mg/dL (ref 0.61–1.24)
GFR, Estimated: >60 mL/min (ref >60)
Glucose, Bld: 99 mg/dL (ref 70–99)
Potassium: 4.4 mmol/L (ref 3.5–5.1)
Sodium: 140 mmol/L (ref 135–145)

## 2022-08-05 LAB — CBC
HCT: 40.9 % (ref 39.0–52.0)
Hemoglobin: 14.7 g/dL (ref 13.0–17.0)
MCH: 31.2 pg (ref 26.0–34.0)
MCHC: 35.9 g/dL (ref 30.0–36.0)
MCV: 86.8 fL (ref 80.0–100.0)
Platelets: 179 10*3/uL (ref 150–400)
RBC: 4.71 MIL/uL (ref 4.22–5.81)
RDW: 11.9 % (ref 11.5–15.5)
WBC: 7.3 10*3/uL (ref 4.0–10.5)
nRBC: 0 % (ref 0.0–0.2)

## 2022-08-05 LAB — SARS CORONAVIRUS 2 BY RT PCR: SARS Coronavirus 2 by RT PCR: NEGATIVE

## 2022-08-05 MED ORDER — ONDANSETRON 8 MG PO TBDP: 8.0000 mg | ORAL_TABLET | Freq: Three times a day (TID) | ORAL | 0 refills | Status: DC | PRN

## 2022-08-05 MED ORDER — IOHEXOL 350 MG/ML SOLN
75.0000 mL | Freq: Once | INTRAVENOUS | Status: AC | PRN
  Administered 2022-08-05: 75 mL via INTRAVENOUS

## 2022-08-05 MED ORDER — SODIUM CHLORIDE 0.9 % IV BOLUS
1000.0000 mL | Freq: Once | INTRAVENOUS | Status: AC
Start: 2022-08-05 — End: 2022-08-05
  Administered 2022-08-05: 1000 mL via INTRAVENOUS

## 2022-08-05 MED ORDER — ONDANSETRON HCL 4 MG/2ML IJ SOLN
4.0000 mg | Freq: Once | INTRAMUSCULAR | Status: AC
  Administered 2022-08-05: 4 mg via INTRAVENOUS
  Filled 2022-08-05: qty 2

## 2022-08-05 NOTE — Discharge Instructions (Signed)
Take the medications as needed for nausea and vomiting.  Return to the ED for recurrent symptoms.  Follow-up with your doctor to be rechecked

## 2022-08-05 NOTE — ED Triage Notes (Signed)
Pt arrived from home via EMS. Went to work this morning and experienced a sudden onset of dizziness. Drove self home. Per family pt speech was slurred, but resolved prior to EMS arrival. Per EMS pt was orthostatic, SBP 90s after ambulating, given bolus. Denies dizziness at this time.

## 2022-08-05 NOTE — ED Provider Notes (Signed)
[facility] Provider Note   CSN: [id number] Arrival date & time: 08/05/22  1130     History  Chief Complaint  Patient presents with   Dizziness    Sudden onset of dizziness while working, drove home. Upon arrival home family noted slurred speech, pt rested, per family slurred speech was worse upon waking, EMS dispatched. No speech issues noted upon EMS arrival.     Casey Branch is a 41 y.o. male.   Dizziness    Patient presented to the ED for acute dizziness while driving home.  Patient states he was just not feeling well.  He felt like he was getting sick.  Patient does have prior history of cerebral aneurysm status post intervention and a stroke.  Patient denied any focal weakness.  He denied any issues with his vision.  Family felt that his speech was somewhat slurred.  EMS was called.  EMS did not notice any speech disturbance on their evaluation.  Patient denies having any trouble with his speech.  He does have mild headache.  He is not having any cough no sore throat.  No fever no abdominal pain.  Home Medications Prior to Admission medications   Medication Sig Start Date End Date Taking? Authorizing Provider  ondansetron (ZOFRAN-ODT) 8 MG disintegrating tablet Take 1 tablet (8 mg total) by mouth every 8 (eight) hours as needed for nausea or vomiting. 08/05/22  Yes Dorie Rank, MD  levETIRAcetam (KEPPRA) 500 MG tablet Take 1 tablet (500 mg total) by mouth 2 (two) times daily. 03/31/22   Ventura Sellers, MD      Allergies    Patient has no known allergies.    Review of Systems   Review of Systems  Neurological:  Positive for dizziness.    Physical Exam Updated Vital Signs BP 112/75   Pulse (!) 57   Temp (!) 97.4 F (36.3 C) (Oral)   Resp 19   Ht 1.803 m (5\' 11" )   Wt 77.1 kg   SpO2 97%   BMI 23.71 kg/m  Physical Exam Vitals and nursing note reviewed.  Constitutional:      General: He is not in acute distress.     Appearance: He is well-developed.  HENT:     Head: Normocephalic and atraumatic.     Right Ear: External ear normal.     Left Ear: External ear normal.  Eyes:     General: No visual field deficit or scleral icterus.       Right eye: No discharge.        Left eye: No discharge.     Conjunctiva/sclera: Conjunctivae normal.  Neck:     Trachea: No tracheal deviation.  Cardiovascular:     Rate and Rhythm: Normal rate and regular rhythm.  Pulmonary:     Effort: Pulmonary effort is normal. No respiratory distress.     Breath sounds: Normal breath sounds. No stridor. No wheezing or rales.  Abdominal:     General: Bowel sounds are normal. There is no distension.     Palpations: Abdomen is soft.     Tenderness: There is no abdominal tenderness. There is no guarding or rebound.  Musculoskeletal:        General: No tenderness.     Cervical back: Neck supple.  Skin:    General: Skin is warm and dry.     Findings: No rash.  Neurological:     Mental Status: He is alert and oriented to person, place,  and time.     Cranial Nerves: No cranial nerve deficit, dysarthria or facial asymmetry.     Sensory: No sensory deficit.     Motor: No abnormal muscle tone, seizure activity or pronator drift.     Coordination: Coordination normal.     Comments:  able to hold both legs off bed for 5 seconds, sensation intact in all extremities,  no left or right sided neglect,  no nystagmus noted, no ataxia, normal gait   Psychiatric:        Mood and Affect: Mood normal.     ED Results / Procedures / Treatments   Labs (all labs ordered are listed, but only abnormal results are displayed) Labs Reviewed  BASIC METABOLIC PANEL - Abnormal; Notable for the following components:      Result Value   Calcium 8.7 (*)    All other components within normal limits  SARS CORONAVIRUS 2 BY RT PCR  CBC    EKG EKG Interpretation  Date/Time:  Wednesday August 05 2022 11:46:35 EST Ventricular Rate:  59 PR  Interval:  167 QRS Duration: 93 QT Interval:  406 QTC Calculation: 403 R Axis:   76 Text Interpretation: Sinus rhythm ST elev, probable normal early repol pattern No significant change since last tracing Confirmed by Linwood Dibbles [phone]) on 08/05/2022 11:56:00 AM  Radiology CT Angio Head W or Wo Contrast  Result Date: 08/05/2022 CLINICAL DATA:  Sudden onset dizziness, slurred speech. History of AVM. EXAM: CT ANGIOGRAPHY HEAD TECHNIQUE: Multidetector CT imaging of the head was performed using the standard protocol during bolus administration of intravenous contrast. Multiplanar CT image reconstructions and MIPs were obtained to evaluate the vascular anatomy. RADIATION DOSE REDUCTION: This exam was performed according to the departmental dose-optimization program which includes automated exposure control, adjustment of the mA and/or kV according to patient size and/or use of iterative reconstruction technique. CONTRAST:  21mL OMNIPAQUE IOHEXOL 350 MG/ML SOLN COMPARISON:  Brain MRI 09/04/2021, CTA head and neck 06/06/2021 FINDINGS: CT HEAD Brain: A left frontal ventricular catheter is in place terminating in the body of the left lateral ventricle, unchanged. The ventricles are stable in size and configuration compared to the prior brain MRI, with unchanged dilation of the left temporal horn. There is no acute intracranial hemorrhage, extra-axial fluid collection, or acute infarct. Calcifications and adjacent gliosis in the right frontal lobe related to a prior ventricular catheter are unchanged. Extensive confluent hypodensity throughout the left cerebral hemisphere is increased compared to the prior CT, and may reflect evolving post radiation change. There is no solid mass lesion. There is no mass effect or midline shift. Vascular: See below. Skull: Normal. Negative for fracture or focal lesion. Sinuses: The imaged paranasal sinuses are clear. The globes and orbits are unremarkable. Other: None. CTA HEAD  Anterior circulation: The intracranial ICAs are patent, without significant stenosis. The bilateral MCAs are patent, without significant stenosis or occlusion. The bilateral ACAs are patent, without significant stenosis or occlusion. There is no anterior circulation aneurysm or AVM. Posterior circulation: The bilateral V4 segments are patent. The basilar artery is patent. The major cerebellar arteries appear patent. The bilateral PCAs are patent, without proximal stenosis or occlusion. Left larger than right posterior communicating arteries are identified. The AVM centered in the region of the left thalamus fed by the left posterior cerebral artery and draining into the straight sinus appears significantly decreased in size compared to the prior CT and MRI, with nidus currently measuring up to approximately 1.2 cm x 1.0  cm in the axial plane (16-96), previously measured approximately 2.0 cm x 1.9 cm on the prior brain MRI when measured at a similar level. The draining veins are smaller compared to the prior CTA and MRI. Venous sinuses: As permitted by contrast timing, patent. Anatomic variants: None. Review of the MIP images confirms the above findings. IMPRESSION: 1. No acute intracranial pathology. 2. Interval decrease in size of the AVM centered in the region of the left thalamus compared to the prior CTA and brain MRI. 3. Stable position of the left frontal ventricular catheter with unchanged size and configuration of the ventricles compared to the prior brain MRI. 4. Extensive confluent hypodensity throughout the left cerebral hemisphere is increased compared to the prior CT, likely reflecting evolving post radiation change. 5. No significant stenosis or occlusion in the intracranial vasculature. Electronically Signed   By: Valetta Mole M.D.   On: 08/05/2022 15:07    Procedures Procedures    Medications Ordered in ED Medications  sodium chloride 0.9 % bolus 1,000 mL (0 mLs Intravenous Stopped 08/05/22  1430)  ondansetron (ZOFRAN) injection 4 mg (4 mg Intravenous Given 08/05/22 1312)  iohexol (OMNIPAQUE) 350 MG/ML injection 75 mL (75 mLs Intravenous Contrast Given 08/05/22 1442)    ED Course/ Medical Decision Making/ A&P                           Medical Decision Making Frontal diagnosis includes but not limited to cerebral hemorrhage, viral illness, orthostatic hypotension  Amount and/or Complexity of Data Reviewed Labs: ordered. Radiology: ordered.  Risk Prescription drug management.   Patient's ED work-up is reassuring.  EMS did note that the patient's blood pressure was low on arrival.  He was given 500 cc normal saline.  No signs of hemorrhage or other acute finding on head CT.  Labs unremarkable.  Patient has been monitored in the ED and his symptoms have improved.  He has been able to eat and drink without any difficulty.  Patient is able to walk without any trouble.  At this time I have low suspicion for acute stroke TIA.  Is possible he may have had a vasovagal episode.  He could be coming down with a viral illness.  Evaluation and diagnostic testing in the emergency Branch does not suggest an emergent condition requiring admission or immediate intervention beyond what has been performed at this time.  The patient is safe for discharge and has been instructed to return immediately for worsening symptoms, change in symptoms or any other concerns.        Final Clinical Impression(s) / ED Diagnoses Final diagnoses:  Dizziness  Nausea and vomiting, unspecified vomiting type    Rx / DC Orders ED Discharge Orders          Ordered    ondansetron (ZOFRAN-ODT) 8 MG disintegrating tablet  Every 8 hours PRN        08/05/22 1535              Dorie Rank, MD 08/05/22 1539

## 2022-10-22 ENCOUNTER — Telehealth: Payer: Commercial Managed Care - HMO | Admitting: Internal Medicine

## 2022-11-01 ENCOUNTER — Other Ambulatory Visit: Payer: Self-pay | Admitting: Internal Medicine

## 2022-12-06 ENCOUNTER — Other Ambulatory Visit: Payer: Self-pay | Admitting: Neurology

## 2022-12-06 ENCOUNTER — Other Ambulatory Visit: Payer: Self-pay | Admitting: Family

## 2022-12-06 DIAGNOSIS — E785 Hyperlipidemia, unspecified: Secondary | ICD-10-CM

## 2022-12-15 ENCOUNTER — Inpatient Hospital Stay: Payer: Commercial Managed Care - HMO | Attending: Internal Medicine | Admitting: Internal Medicine

## 2022-12-15 VITALS — BP 119/82 | HR 60 | Temp 97.2°F | Resp 15 | Wt 182.6 lb

## 2022-12-15 DIAGNOSIS — R569 Unspecified convulsions: Secondary | ICD-10-CM | POA: Diagnosis not present

## 2022-12-15 DIAGNOSIS — Q282 Arteriovenous malformation of cerebral vessels: Secondary | ICD-10-CM

## 2022-12-15 DIAGNOSIS — Z87891 Personal history of nicotine dependence: Secondary | ICD-10-CM | POA: Diagnosis not present

## 2022-12-15 MED ORDER — LEVETIRACETAM 500 MG PO TABS: 500.0000 mg | ORAL_TABLET | Freq: Two times a day (BID) | ORAL | 5 refills | Status: DC

## 2022-12-15 NOTE — Progress Notes (Signed)
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  EVD placed until 6/23 (Ostergard) 06/05/21: Radiosurgery for left temporal AVM [facility]) 06/06/21: Presents with focal seizure, Keppra initiated.  No structural changes  Interval History: Casey Branch presents today for follow up.  No breakthrough seizures, he maintains compliance with Keppra 500mg  BID.  He did have episode of dizziness in November that resolved spontaneously.  No new or progressive changes.  H+P (12/16/21) Patient presents today at request of radiation oncology for follow up given neurologic issues surrounding AVM.  Keppra had been discontinued (by neurosurgery?) after wean over past 2 months.  No frank recurrence of seizures, but he does describe paroxysmal episodes of dizziness which have no positional correlate.  Frequency is intermittent, less than daily.  Over past few weeks, he has also described headaches occurring less than daily.  They affect the left side of his head and are not severe, no nausea/vomiting, no time of day predilection.  He is otherwise functioning well, working full time.  [place] to [place] wife and son.  Medications: Current Outpatient Medications on File Prior to Visit  Medication Sig Dispense Refill   levETIRAcetam (KEPPRA) 500 MG tablet TAKE 1 TABLET BY MOUTH TWICE A DAY 60 tablet 2   ondansetron (ZOFRAN-ODT) 8 MG disintegrating tablet Take 1 tablet (8 mg total) by mouth every 8 (eight) hours as needed for nausea or vomiting. 12 tablet 0   No current  facility-administered medications on file prior to visit.    Allergies: No Known Allergies Past Medical History:  Past Medical History:  Diagnosis Date   Brain aneurysm    Past Surgical History:  Past Surgical History:  Procedure Laterality Date   IR 3D INDEPENDENT WKST  03/11/2021   IR ANGIO INTRA EXTRACRAN SEL COM CAROTID INNOMINATE BILAT MOD SED  03/11/2021   IR ANGIO VERTEBRAL SEL VERTEBRAL BILAT MOD SED  03/11/2021   RADIOLOGY WITH ANESTHESIA N/A 03/11/2021   Procedure: IR WITH ANESTHESIA - DIAGNOSTIC CEREBRAL ANGIOGRAM;  Surgeon: Consuella Lose, MD;  Location: [facility];  Service: Radiology;  Laterality: N/A;   VENTRICULOPERITONEAL SHUNT     Social History:  Social History   Socioeconomic History   Marital status: Married    Spouse name: Not on file   Number of children: Not on file   Years of education: Not on file   Highest education level: Not on file  Occupational History   Not on file  Tobacco Use   Smoking status: Former    Types: Cigarettes    Passive exposure: Never   Smokeless tobacco: Never  Vaping Use   Vaping Use: Never used  Substance and Sexual Activity   Alcohol use: Not Currently    Comment: occasioanl   Drug use: No   Sexual activity: Yes  Other Topics Concern   Not on file  Social History Narrative   Not on file   Social Determinants of Health   Financial Resource Strain: Not on file  Food Insecurity: Not on file  Transportation Needs: Not on file  Physical Activity: Not on file  Stress: Not on file  Social Connections: Not on file  Intimate Partner Violence: Not on file   Family History: No family history on file.  Review of Systems: Constitutional: Doesn't report fevers, chills or abnormal weight loss Eyes: Doesn't report blurriness of vision Ears, nose, mouth, throat, and face: Doesn't report sore throat Respiratory: Doesn't report cough, dyspnea or wheezes Cardiovascular: Doesn't report palpitation, chest discomfort   Gastrointestinal:  Doesn't report nausea, constipation, diarrhea GU: Doesn't report incontinence Skin: Doesn't report skin rashes Neurological: Per HPI Musculoskeletal: Doesn't report joint pain Behavioral/Psych: Doesn't report anxiety  Physical Exam: Vitals:   12/15/22 1003  BP: 119/82  Pulse: 60  Resp: 15  Temp: (!) 97.2 F (36.2 C)  SpO2: 100%    KPS: 90. General: Alert, cooperative, pleasant, in no acute distress Head: Normal EENT: No conjunctival injection or scleral icterus.  Lungs: Resp effort normal Cardiac: Regular rate Abdomen: Non-distended abdomen Skin: No rashes cyanosis or petechiae. Extremities: No clubbing or edema  Neurologic Exam: Mental Status: Awake, alert, attentive to examiner. Oriented to self and environment. Language is fluent with intact comprehension.  Cranial Nerves: Visual acuity is grossly normal. Visual fields are full. Extra-ocular movements intact. No ptosis. Face is symmetric Motor: Tone and bulk are normal. Power is full in both arms and legs. Reflexes are symmetric, no pathologic reflexes present.  Sensory: Intact to light touch Gait: Normal.   Labs: I have reviewed the data as listed    Component Value Date/Time   NA 140 08/05/2022 1205   NA 141 12/09/2021 1115   K 4.4 08/05/2022 1205   CL 107 08/05/2022 1205   CO2 24 08/05/2022 1205   GLUCOSE 99 08/05/2022 1205   BUN 13 08/05/2022 1205   BUN 13 12/09/2021 1115   CREATININE 1.02 08/05/2022 1205   CREATININE 1.20 05/29/2021 1045   CALCIUM 8.7 (L) 08/05/2022 1205   PROT 7.7 12/09/2021 1115   ALBUMIN 4.6 12/09/2021 1115   AST 26 12/09/2021 1115   ALT 32 12/09/2021 1115   ALKPHOS 59 12/09/2021 1115   BILITOT 0.4 12/09/2021 1115   GFRNONAA >60 08/05/2022 1205   GFRNONAA >60 05/29/2021 1045   Lab Results  Component Value Date   WBC 7.3 08/05/2022   NEUTROABS 5.7 06/06/2021   HGB 14.7 08/05/2022   HCT 40.9 08/05/2022   MCV 86.8 08/05/2022   PLT 179 08/05/2022     Imaging:   Assessment/Plan Focal seizures ([place])  Casey Branch is clinically stable today, no breakthrough seizures.   We recommended continuing Keppra at 500mg  BID unless further events occur.  He is agreeable with this.  Will con't to follow with Dr. Zada Finders for AVM imaging/surveillance.  All questions were answered. The patient knows to call the clinic with any problems, questions or concerns. No barriers to learning were detected.  We ask that Casey Branch return to clinic in 6 months with MRI brain for evaluation, or sooner as needed.  The total time spent in the encounter was 30 minutes and more than 50% was on counseling and review of test results   Ventura Sellers, MD Medical Director of Neuro-Oncology [facility] at [place] 12/15/22 10:12 AM

## 2022-12-22 ENCOUNTER — Telehealth: Payer: Self-pay | Admitting: Internal Medicine

## 2022-12-22 NOTE — Telephone Encounter (Signed)
Called per 4/2 IB message to schedule f/u. Spoke with patient's spouse. Patient scheduled and will be notified.

## 2023-01-07 ENCOUNTER — Ambulatory Visit: Payer: Managed Care, Other (non HMO) | Admitting: Internal Medicine

## 2023-05-24 ENCOUNTER — Other Ambulatory Visit: Payer: Self-pay

## 2023-05-24 ENCOUNTER — Emergency Department (HOSPITAL_COMMUNITY): Payer: Commercial Managed Care - HMO

## 2023-05-24 ENCOUNTER — Emergency Department (HOSPITAL_COMMUNITY)
Admission: EM | Admit: 2023-05-24 | Payer: Commercial Managed Care - HMO | Source: Home / Self Care | Attending: Emergency Medicine | Admitting: Emergency Medicine

## 2023-05-24 DIAGNOSIS — Z1152 Encounter for screening for COVID-19: Secondary | ICD-10-CM | POA: Diagnosis not present

## 2023-05-24 DIAGNOSIS — R42 Dizziness and giddiness: Secondary | ICD-10-CM | POA: Insufficient documentation

## 2023-05-24 LAB — RESP PANEL BY RT-PCR (RSV, FLU A&B, COVID)  RVPGX2
Influenza A by PCR: NEGATIVE
Influenza B by PCR: NEGATIVE
Resp Syncytial Virus by PCR: NEGATIVE
SARS Coronavirus 2 by RT PCR: NEGATIVE

## 2023-05-24 LAB — COMPREHENSIVE METABOLIC PANEL
ALT: 18 U/L (ref 0–44)
AST: 20 U/L (ref 15–41)
Albumin: 3.7 g/dL (ref 3.5–5.0)
Alkaline Phosphatase: 46 U/L (ref 38–126)
Anion gap: 9 (ref 5–15)
BUN: 13 mg/dL (ref 6–20)
CO2: 27 mmol/L (ref 22–32)
Calcium: 8.8 mg/dL — ABNORMAL LOW (ref 8.9–10.3)
Chloride: 103 mmol/L (ref 98–111)
Creatinine, Ser: 1.04 mg/dL (ref 0.61–1.24)
GFR, Estimated: >60 mL/min (ref >60)
Glucose, Bld: 141 mg/dL — ABNORMAL HIGH (ref 70–99)
Potassium: 4.1 mmol/L (ref 3.5–5.1)
Sodium: 139 mmol/L (ref 135–145)
Total Bilirubin: 0.6 mg/dL (ref 0.3–1.2)
Total Protein: 7 g/dL (ref 6.5–8.1)

## 2023-05-24 LAB — CBC
HCT: 43.4 % (ref 39.0–52.0)
Hemoglobin: 14.9 g/dL (ref 13.0–17.0)
MCH: 30.7 pg (ref 26.0–34.0)
MCHC: 34.3 g/dL (ref 30.0–36.0)
MCV: 89.3 fL (ref 80.0–100.0)
Platelets: 222 10*3/uL (ref 150–400)
RBC: 4.86 MIL/uL (ref 4.22–5.81)
RDW: 12.3 % (ref 11.5–15.5)
WBC: 5.4 10*3/uL (ref 4.0–10.5)
nRBC: 0 % (ref 0.0–0.2)

## 2023-05-24 LAB — I-STAT CHEM 8, ED
BUN: 16 mg/dL (ref 6–20)
Calcium, Ion: 1.15 mmol/L (ref 1.15–1.40)
Chloride: 102 mmol/L (ref 98–111)
Creatinine, Ser: 1 mg/dL (ref 0.61–1.24)
Glucose, Bld: 137 mg/dL — ABNORMAL HIGH (ref 70–99)
HCT: 45 % (ref 39.0–52.0)
Hemoglobin: 15.3 g/dL (ref 13.0–17.0)
Potassium: 4.2 mmol/L (ref 3.5–5.1)
Sodium: 140 mmol/L (ref 135–145)
TCO2: 28 mmol/L (ref 22–32)

## 2023-05-24 LAB — ETHANOL: Alcohol, Ethyl (B): <10 mg/dL (ref <10)

## 2023-05-24 LAB — DIFFERENTIAL
Abs Immature Granulocytes: 0.01 10*3/uL (ref 0.00–0.07)
Basophils Absolute: 0 10*3/uL (ref 0.0–0.1)
Basophils Relative: 1 %
Eosinophils Absolute: 0.1 10*3/uL (ref 0.0–0.5)
Eosinophils Relative: 2 %
Immature Granulocytes: 0 %
Lymphocytes Relative: 22 %
Lymphs Abs: 1.2 10*3/uL (ref 0.7–4.0)
Monocytes Absolute: 0.3 10*3/uL (ref 0.1–1.0)
Monocytes Relative: 5 %
Neutro Abs: 3.8 10*3/uL (ref 1.7–7.7)
Neutrophils Relative %: 70 %

## 2023-05-24 LAB — PROTIME-INR
INR: 1 (ref 0.8–1.2)
Prothrombin Time: 13.8 seconds (ref 11.4–15.2)

## 2023-05-24 LAB — CBG MONITORING, ED: Glucose-Capillary: 174 mg/dL — ABNORMAL HIGH (ref 70–99)

## 2023-05-24 LAB — APTT: aPTT: 35 s (ref 24–36)

## 2023-05-24 MED ORDER — SODIUM CHLORIDE 0.9% FLUSH: 3.0000 mL | Freq: Once | INTRAVENOUS | Status: DC

## 2023-05-24 MED ORDER — IOHEXOL 350 MG/ML SOLN
75.0000 mL | Freq: Once | INTRAVENOUS | Status: AC | PRN
  Administered 2023-05-24: 75 mL via INTRAVENOUS

## 2023-05-24 NOTE — ED Triage Notes (Signed)
Pt arrives with c/o dizziness that started around 18:00. Pt has hx of AVM and prior stroke. Pt denies headache, CP, or SOB. Pt hx of seizures and takes keppra. Pt has not had a seizure in over a year and has missed any doses of seizure meds. Pt a&ox4. Per pt, he feels like his head is spinning.

## 2023-05-24 NOTE — ED Notes (Signed)
Pt requesting to be tested for covid because he found out his brother tested positive and he was around brother this past weekend.

## 2023-05-24 NOTE — Discharge Instructions (Addendum)
You were seen in the ER for your dizziness. Your work up showed no signs of stroke. This sounded like vertigo which is the room spinning sensation. You can follow up with your neurologist to have your symptoms rechecked. You should return to the emergency department if you have recurrent dizziness that does not go away on its own, numbness or weakness on one side of the body compared to the other, you're unable to walk, you're unable to talk or if you have any other new or concerning symptoms.

## 2023-05-24 NOTE — ED Provider Notes (Signed)
[place] EMERGENCY DEPARTMENT AT [facility] Provider Note   CSN: [id number] Arrival date & time: 05/24/23  1935     History  Chief Complaint  Patient presents with   Dizziness    Casey Branch is a 42 y.o. male.  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  Patient states that he was driving in his car when he had a sudden onset of room spinning dizziness.  He denied any associated nausea, vomiting, headache, vision changes, numbness or weakness.  He states that he was able to ambulate normally.  He states that the symptoms lasted about 40 minutes and have resolved on its own.  He states that he does not believe that he is ever had this type of dizziness before in the past.  The history is provided by the patient and the spouse.  Dizziness      Home Medications Prior to Admission medications   Medication Sig Start Date End Date Taking? Authorizing Provider  levETIRAcetam (KEPPRA) 500 MG tablet Take 1 tablet (500 mg total) by mouth 2 (two) times daily. 12/15/22  Yes Vaslow, Georgeanna Lea, MD      Allergies    Patient has no known allergies.    Review of Systems   Review of Systems  Neurological:  Positive for dizziness.    Physical Exam Updated Vital Signs BP 130/78   Pulse 60   Temp 98.1 F (36.7 C) (Oral)   Resp (!) 21   Wt 81.6 kg   SpO2 100%   BMI 25.10 kg/m  Physical Exam Vitals and nursing note reviewed.  Constitutional:      General: He is not in acute distress.    Appearance: Normal appearance.  HENT:     Head: Normocephalic and atraumatic.     Nose: Nose normal.     Mouth/Throat:     Mouth: Mucous membranes are moist.     Pharynx: Oropharynx is clear.  Eyes:     Extraocular Movements: Extraocular movements intact.     Conjunctiva/sclera: Conjunctivae normal.     Pupils: Pupils are equal,  round, and reactive to light.     Comments: Bilateral horizontal nystagmus  Cardiovascular:     Rate and Rhythm: Normal rate and regular rhythm.     Heart sounds: Normal heart sounds.  Pulmonary:     Effort: Pulmonary effort is normal.     Breath sounds: Normal breath sounds.  Abdominal:     General: Abdomen is flat.     Palpations: Abdomen is soft.     Tenderness: There is no abdominal tenderness.  Musculoskeletal:        General: Normal range of motion.     Cervical back: Normal range of motion.  Skin:    General: Skin is warm and dry.  Neurological:     General: No focal deficit present.     Mental Status: He is alert and oriented to person, place, and time.     Cranial Nerves: No cranial nerve deficit.     Sensory: No sensory deficit.     Motor: No weakness.     Coordination: Coordination normal.  Psychiatric:        Mood and Affect: Mood normal.        Behavior: Behavior normal.     ED Results / Procedures / Treatments   Labs (  all labs ordered are listed, but only abnormal results are displayed) Labs Reviewed  COMPREHENSIVE METABOLIC PANEL - Abnormal; Notable for the following components:      Result Value   Glucose, Bld 141 (*)    Calcium 8.8 (*)    All other components within normal limits  I-STAT CHEM 8, ED - Abnormal; Notable for the following components:   Glucose, Bld 137 (*)    All other components within normal limits  CBG MONITORING, ED - Abnormal; Notable for the following components:   Glucose-Capillary 174 (*)    All other components within normal limits  RESP PANEL BY RT-PCR (RSV, FLU A&B, COVID)  RVPGX2  PROTIME-INR  APTT  CBC  DIFFERENTIAL  ETHANOL    EKG EKG Interpretation Date/Time:  Monday May 24 2023 21:02:12 EDT Ventricular Rate:  59 PR Interval:  163 QRS Duration:  95 QT Interval:  384 QTC Calculation: 381 R Axis:   88  Text Interpretation: Sinus rhythm Probable left ventricular hypertrophy Since last tracing of earlier  today No significant change was found Confirmed by Elayne Snare ([id number]) on 05/24/2023 9:05:47 PM  Radiology CT ANGIO HEAD NECK W WO CM  Result Date: 05/24/2023 CLINICAL DATA:  Initial evaluation for acute dizziness. EXAM: CT ANGIOGRAPHY HEAD AND NECK WITH AND WITHOUT CONTRAST TECHNIQUE: Multidetector CT imaging of the head and neck was performed using the standard protocol during bolus administration of intravenous contrast. Multiplanar CT image reconstructions and MIPs were obtained to evaluate the vascular anatomy. Carotid stenosis measurements (when applicable) are obtained utilizing NASCET criteria, using the distal internal carotid diameter as the denominator. RADIATION DOSE REDUCTION: This exam was performed according to the departmental dose-optimization program which includes automated exposure control, adjustment of the mA and/or kV according to patient size and/or use of iterative reconstruction technique. CONTRAST:  75mL OMNIPAQUE IOHEXOL 350 MG/ML SOLN COMPARISON:  Prior studies from 08/05/2022 and 06/06/2021. FINDINGS: CTA NECK FINDINGS Aortic arch: Standard branching. Imaged portion shows no evidence of aneurysm or dissection. No significant stenosis of the major arch vessel origins. Right carotid system: No evidence of dissection, stenosis (50% or greater), or occlusion. Left carotid system: No evidence of dissection, stenosis (50% or greater), or occlusion. Vertebral arteries: Both vertebral arteries arise from subclavian arteries. Vertebral arteries are patent without stenosis or dissection. Skeleton: No discrete or worrisome osseous lesions. Mild cervical spondylosis, most pronounced at C6-7. Other neck: No other acute finding. Upper chest: No other acute finding. Review of the MIP images confirms the above findings CTA HEAD FINDINGS Anterior circulation: Both internal carotid arteries remain patent through the siphons without stenosis or other abnormality. A1 segments, anterior cortical flex  common anterior cerebral arteries patent without stenosis. No M1 stenosis or occlusion. Distal MCA branches perfused and symmetric. Posterior circulation: Both V4 segments patent without stenosis. Both PICA patent. Basilar patent without stenosis. Superior cerebral arteries patent bilaterally. Both PCAs primarily supplied via the basilar. Right PCA widely patent to its distal aspect without stenosis. There are multifocal severe distal left P2/P3 stenoses, new from prior (series 8, image 96, 92). Distal left PCA branches are 10 UA did but grossly perfused. Patient's known AVM centered at the level of the left thalamus again seen. Primary arterial supply from the left PCA, with primary venous drainage into the deep venous system. Overall, size of this vascular malformation is decreased from prior, with a few irregular serpiginous vessels now seen at this location (series 6, images 260, 258). The primary draining vein appears somewhat smaller.  Measurement of this lesion is now somewhat difficult. Venous sinuses: Grossly patent allowing for timing the contrast bolus. Anatomic variants: None significant. Review of the MIP images confirms the above findings IMPRESSION: 1. Negative CTA for large vessel occlusion or other emergent finding. 2. Multifocal severe distal left P2/P3 stenoses, new from prior. 3. Interval decrease in size of patient's known AVM centered at the level of the left thalamus. 4. Otherwise stable CTA of the head and neck. No other hemodynamically significant or correctable stenosis. Electronically Signed   By: Rise Mu M.D.   On: 05/24/2023 22:58   CT HEAD WO CONTRAST  Result Date: 05/24/2023 CLINICAL DATA:  Neuro deficit, acute, stroke suspected.  Dizziness. EXAM: CT HEAD WITHOUT CONTRAST TECHNIQUE: Contiguous axial images were obtained from the base of the skull through the vertex without intravenous contrast. RADIATION DOSE REDUCTION: This exam was performed according to the  departmental dose-optimization program which includes automated exposure control, adjustment of the mA and/or kV according to patient size and/or use of iterative reconstruction technique. COMPARISON:  06/06/2021 FINDINGS: Brain: Encephalomalacia within the left upper lobe with dilatation of the left hip or form, unchanged. Left frontal VP shunt in place with stable size of the ventricles. No hydrocephalus. No acute hemorrhage or infarction. Vascular: No hyperdense vessel or unexpected calcification. Skull: No acute calvarial abnormality. Sinuses/Orbits: No acute findings Other: None IMPRESSION: Left frontal VP shunt remains in place, unchanged. No hydrocephalus. Stable chronic encephalomalacia in the left upper lobe with dilatation of the left hip performed. No acute intracranial abnormality. Electronically Signed   By: Charlett Nose M.D.   On: 05/24/2023 21:33    Procedures Procedures    Medications Ordered in ED Medications  sodium chloride flush (NS) 0.9 % injection 3 mL (0 mLs Intravenous Hold 05/24/23 2103)  iohexol (OMNIPAQUE) 350 MG/ML injection 75 mL (75 mLs Intravenous Contrast Given 05/24/23 2125)    ED Course/ Medical Decision Making/ A&P Clinical Course as of 05/24/23 2339  Mon May 24, 2023  2306 Multifocal severe distal left P2/P3 stenoses, new from prior. Decrease in size of prior AVMs. Will discuss with neurology but likely will need MRI. [VK]  2317 I spoke with Dr. Amada Jupiter from neurology, unlikely to be symptomatic with vertigo with the P2/P3 stenosis. If MRI is negative, recommends outpatient follow up. [VK]  2338 Patient signed out to Berle Mull PA pending MRI and reassessment. 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 The complaint involves an extensive differential diagnosis and also carries with it a high risk of complications and morbidity.    The differential diagnosis includes central versus peripheral vertigo, ICH or mass effect, electrolyte abnormality, arrhythmia, anemia  Additional history obtained: Additional history obtained from spouse Records reviewed neuro-oncology records  ED Course and Reassessment: On patient's arrival he is hemodynamically stable in no acute distress.  He was initially evaluated in triage and had EKG, labs and CT head and CTA performed to evaluate for potential cause of his dizziness.  EKG showed normal sinus rhythm without acute ischemic changes.  Labs  are within normal range.  CT is pending at this time.  Patient does have bilateral nystagmus on exam though is asymptomatic.  He has no other focal neurologic deficits including a normal finger-to-nose.  Patient will be closely reassessed.  Independent labs interpretation:  The following labs were independently interpreted: Within normal range  Independent visualization of imaging: - I independently visualized the following imaging with scope of interpretation limited to determining acute life threatening conditions related to emergency care: CTH/CTA, which revealed new stenosis of L P2/P3 segment  Consultation: - Consulted or discussed management/test interpretation w/ external professional: neurology     Amount and/or Complexity of Data Reviewed Radiology: ordered.          Final Clinical Impression(s) / ED Diagnoses Final diagnoses:  Vertigo    Rx / DC Orders ED Discharge Orders     None         Rexford Maus, DO 05/24/23 2339

## 2023-05-25 ENCOUNTER — Emergency Department (HOSPITAL_COMMUNITY): Payer: Commercial Managed Care - HMO

## 2023-05-25 DIAGNOSIS — R42 Dizziness and giddiness: Secondary | ICD-10-CM | POA: Diagnosis not present

## 2023-05-25 NOTE — ED Provider Notes (Signed)
She is at shift change from Dr. Jacquenette Shone please see her note for full detail  Short patient with medical history including AVM status post ICH with stenting, epilepsy currently on Keppra presenting with dizziness, last about 40 minutes and resolved, states been in the ER he has had no complaints.  Per previous provider follow-up on MRI, is unremarkable can be discharged home. Physical Exam  BP 131/79   Pulse (!) 59   Temp 98.1 F (36.7 C) (Oral)   Resp 20   Wt 81.6 kg   SpO2 100%   BMI 25.10 kg/m   Physical Exam Vitals and nursing note reviewed.  Constitutional:      General: He is not in acute distress.    Appearance: He is not ill-appearing.  HENT:     Head: Normocephalic and atraumatic.     Nose: No congestion.  Eyes:     Conjunctiva/sclera: Conjunctivae normal.  Cardiovascular:     Rate and Rhythm: Normal rate and regular rhythm.     Pulses: Normal pulses.     Heart sounds: No murmur heard.    No friction rub. No gallop.  Pulmonary:     Effort: No respiratory distress.     Breath sounds: No wheezing, rhonchi or rales.  Skin:    General: Skin is warm and dry.  Neurological:     Mental Status: He is alert.     Comments: Cranial nerves II through XII grossly intact no difficulty with word finding following two-step commands there is no real apneas present, patient is gait fully intact.  Psychiatric:        Mood and Affect: Mood normal.     Procedures  Procedures  ED Course / MDM   Clinical Course as of 05/25/23 0130  Mon May 24, 2023  2306 Multifocal severe distal left P2/P3 stenoses, new from prior. Decrease in size of prior AVMs. Will discuss with neurology but likely will need MRI. [VK]  2317 I spoke with Dr. Amada Jupiter from neurology, unlikely to be symptomatic with vertigo with the P2/P3 stenosis. If MRI is negative, recommends outpatient follow up. [VK]  2338 Patient signed out to Berle Mull PA pending MRI and reassessment. [VK]    Clinical Course  User Index [VK] Rexford Maus, DO   Medical Decision Making Amount and/or Complexity of Data Reviewed Radiology: ordered.    Lab Tests:  I Ordered, and personally interpreted labs.  The pertinent results include: CBC unremarkable, CMP reveals glucose 141, calcium 8.8, prothrombin time INR unremarkable, APTT unremarkable, ethanol negative, respiratory panel negative   Imaging Studies ordered:  I ordered imaging studies including CT head, CT angio, MRI brain I independently visualized and interpreted imaging which showed CT imaging negative acute findings, CTA negative for acute findings does show severe distal left P2/P3 stenosis, MRI negative for acute findings I agree with the radiologist interpretation   Cardiac Monitoring:  The patient was maintained on a cardiac monitor.  I personally viewed and interpreted the cardiac monitored which showed an underlying rhythm of: Without signs of ischemia   Medicines ordered and prescription drug management:  I ordered medication including N/A I have reviewed the patients home medicines and have made adjustments as needed  Critical Interventions:  N/A   Reevaluation:  Patient was reassessed he is resting comfortably, he is agreement discharge at this time.  Consultations Obtained:  N/a    Test Considered:  N/a    Rule out low suspicion for internal head bleed and or mass  as CT imaging is negative for acute findings.  Low suspicion for CVA she has no focal deficit present my exam.  Low suspicion for dissection of the vertebral or carotid artery CTA is negative.  Low suspicion for meningitis as she has no meningeal sign present.     Dispostion and problem list  After consideration of the diagnostic results and the patients response to treatment, I feel that the patent would benefit from discharge.  Dizziness-likely vertigo can follow-up with neurology for further evaluation and strict return  precautions.           Carroll Sage, PA-C 05/25/23 0134    Gilda Crease, MD 05/26/23 0700

## 2023-06-02 ENCOUNTER — Encounter: Payer: Self-pay | Admitting: Internal Medicine

## 2023-06-03 ENCOUNTER — Encounter (HOSPITAL_COMMUNITY): Payer: Self-pay

## 2023-06-03 ENCOUNTER — Ambulatory Visit (HOSPITAL_COMMUNITY): Payer: Commercial Managed Care - HMO

## 2023-06-07 ENCOUNTER — Inpatient Hospital Stay: Payer: Commercial Managed Care - HMO | Attending: Internal Medicine | Admitting: Internal Medicine

## 2023-06-07 ENCOUNTER — Inpatient Hospital Stay: Payer: Commercial Managed Care - HMO | Admitting: Licensed Clinical Social Worker

## 2023-06-07 VITALS — BP 112/83 | HR 90 | Temp 97.9°F | Resp 20 | Wt 168.2 lb

## 2023-06-07 DIAGNOSIS — Q282 Arteriovenous malformation of cerebral vessels: Secondary | ICD-10-CM | POA: Insufficient documentation

## 2023-06-07 DIAGNOSIS — Z87891 Personal history of nicotine dependence: Secondary | ICD-10-CM | POA: Insufficient documentation

## 2023-06-07 DIAGNOSIS — R569 Unspecified convulsions: Secondary | ICD-10-CM

## 2023-06-07 DIAGNOSIS — Z79899 Other long term (current) drug therapy: Secondary | ICD-10-CM | POA: Diagnosis not present

## 2023-06-07 DIAGNOSIS — F32A Depression, unspecified: Secondary | ICD-10-CM | POA: Diagnosis not present

## 2023-06-07 MED ORDER — SERTRALINE HCL 25 MG PO TABS: 25.0000 mg | ORAL_TABLET | Freq: Every day | ORAL | 2 refills | Status: DC

## 2023-06-07 NOTE — Progress Notes (Signed)
[facility] CSW Progress Note  Clinical Child psychotherapist contacted patient by phone to discuss possible counseling options.  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  Pt's memory has been impacted as has been his ability to keep a steady job.  Pt reports he has a counselor he speaks to over the phone who has been helpful and he intends to continue to work with them.  Pt appreciative of the anti-depressant Dr. Barbaraann Cao prescribed and is hopeful this will help to improve his overall mood.  CSW emailed contact information to pt's wife should he at any time wish for additional counseling resources.      Rachel Moulds, LCSW Clinical Social Worker [facility]    Patient is participating in a Managed Medicaid Plan:  Yes

## 2023-06-07 NOTE — Progress Notes (Signed)
[facility] [address]  [place], [place] [address] [phone]   Interval Evaluation  Date of Service: 06/07/23 Patient Name: Casey Branch Patient MRN: [id number] Patient DOB: 1981/05/06 Provider: Henreitta Leber, MD  Identifying Statement:  Casey Branch is a 42 y.o. male with left temporal  AVM, seizures     Relevant History: 2008?: Prior ICH, AVM diagnosis, shunt placement 03/08/21: Presents with obtundation, IVH.  EVD placed until 6/23 (Ostergard) 06/05/21: Radiosurgery for left temporal AVM Kathrynn Running) 06/06/21: Presents with focal seizure, Keppra initiated.  No structural changes  Interval History: Casey Branch presents today for follow up after recent MRI brain.  He had been in the ED for vertigo episode.  Does endorse depression symptoms. No breakthrough seizures, he maintains compliance with Keppra 500mg  BID.  No new or progressive changes.  H+P (12/16/21) Patient presents today at request of radiation oncology for follow up given neurologic issues surrounding AVM.  Keppra had been discontinued (by neurosurgery?) after wean over past 2 months.  No frank recurrence of seizures, but he does describe paroxysmal episodes of dizziness which have no positional correlate.  Frequency is intermittent, less than daily.  Over past few weeks, he has also described headaches occurring less than daily.  They affect the left side of his head and are not severe, no nausea/vomiting, no time of day predilection.  He is otherwise functioning well, working full time.  [place] wife and son.  Medications: Current Outpatient Medications on File Prior to Visit  Medication Sig Dispense Refill   levETIRAcetam (KEPPRA) 500 MG tablet Take 1 tablet (500 mg total) by mouth 2 (two) times daily. 60 tablet 5   No current facility-administered medications on file prior to visit.    Allergies: No Known Allergies Past Medical History:   Past Medical History:  Diagnosis Date   Brain aneurysm    Past Surgical History:  Past Surgical History:  Procedure Laterality Date   IR 3D INDEPENDENT WKST  03/11/2021   IR ANGIO INTRA EXTRACRAN SEL COM CAROTID INNOMINATE BILAT MOD SED  03/11/2021   IR ANGIO VERTEBRAL SEL VERTEBRAL BILAT MOD SED  03/11/2021   RADIOLOGY WITH ANESTHESIA N/A 03/11/2021   Procedure: IR WITH ANESTHESIA - DIAGNOSTIC CEREBRAL ANGIOGRAM;  Surgeon: Lisbeth Renshaw, MD;  Location: [facility] OR;  Service: Radiology;  Laterality: N/A;   VENTRICULOPERITONEAL SHUNT     Social History:  Social History   Socioeconomic History   Marital status: Married    Spouse name: Not on file   Number of children: Not on file   Years of education: Not on file   Highest education level: Not on file  Occupational History   Not on file  Tobacco Use   Smoking status: Former    Types: Cigarettes    Passive exposure: Never   Smokeless tobacco: Never  Vaping Use   Vaping status: Never Used  Substance and Sexual Activity   Alcohol use: Not Currently    Comment: occasioanl   Drug use: No   Sexual activity: Yes  Other Topics Concern   Not on file  Social History Narrative   Not on file   Social Determinants of Health   Financial Resource Strain: Not on file  Food Insecurity: Not on file  Transportation Needs: Not on file  Physical Activity: Not on file  Stress: Not on file  Social Connections: Unknown (02/03/2022)   [facility] from [facility]   Social Network  Social Network: Not on file  Intimate Partner Violence: Unknown (12/26/2021)   Received from [facility]   HITS    Physically Hurt: Not on file    Insult or Talk Down To: Not on file    Threaten Physical Harm: Not on file    Scream or Curse: Not on file   Family History: No family history on file.  Review of Systems: Constitutional: Doesn't report fevers, chills or abnormal weight loss Eyes: Doesn't report blurriness of vision Ears, nose, mouth, throat,  and face: Doesn't report sore throat Respiratory: Doesn't report cough, dyspnea or wheezes Cardiovascular: Doesn't report palpitation, chest discomfort  Gastrointestinal:  Doesn't report nausea, constipation, diarrhea GU: Doesn't report incontinence Skin: Doesn't report skin rashes Neurological: Per HPI Musculoskeletal: Doesn't report joint pain Behavioral/Psych: Doesn't report anxiety  Physical Exam: Vitals:   06/07/23 0851  BP: 112/83  Pulse: 90  Resp: 20  Temp: 97.9 F (36.6 C)  SpO2: 98%    KPS: 90. General: Alert, cooperative, pleasant, in no acute distress Head: Normal EENT: No conjunctival injection or scleral icterus.  Lungs: Resp effort normal Cardiac: Regular rate Abdomen: Non-distended abdomen Skin: No rashes cyanosis or petechiae. Extremities: No clubbing or edema  Neurologic Exam: Mental Status: Awake, alert, attentive to examiner. Oriented to self and environment. Language is fluent with intact comprehension.  Cranial Nerves: Visual acuity is grossly normal. Visual fields are full. Extra-ocular movements intact. No ptosis. Face is symmetric Motor: Tone and bulk are normal. Power is full in both arms and legs. Reflexes are symmetric, no pathologic reflexes present.  Sensory: Intact to light touch Gait: Normal.   Labs: I have reviewed the data as listed    Component Value Date/Time   NA 140 05/24/2023 2020   NA 141 12/09/2021 1115   K 4.2 05/24/2023 2020   CL 102 05/24/2023 2020   CO2 27 05/24/2023 2000   GLUCOSE 137 (H) 05/24/2023 2020   BUN 16 05/24/2023 2020   BUN 13 12/09/2021 1115   CREATININE 1.00 05/24/2023 2020   CREATININE 1.20 05/29/2021 1045   CALCIUM 8.8 (L) 05/24/2023 2000   PROT 7.0 05/24/2023 2000   PROT 7.7 12/09/2021 1115   ALBUMIN 3.7 05/24/2023 2000   ALBUMIN 4.6 12/09/2021 1115   AST 20 05/24/2023 2000   ALT 18 05/24/2023 2000   ALKPHOS 46 05/24/2023 2000   BILITOT 0.6 05/24/2023 2000   BILITOT 0.4 12/09/2021 1115    GFRNONAA >60 05/24/2023 2000   GFRNONAA >60 05/29/2021 1045   Lab Results  Component Value Date   WBC 5.4 05/24/2023   NEUTROABS 3.8 05/24/2023   HGB 15.3 05/24/2023   HCT 45.0 05/24/2023   MCV 89.3 05/24/2023   PLT 222 05/24/2023    Imaging:   Assessment/Plan Focal seizures (HCC)  AVM (arteriovenous malformation) brain  Zeandre Zelinski is clinically stable today, no new or progressive changes aside from isolated dizzy spell.   We recommended continuing Keppra at 500mg  BID unless further events occur.  He is agreeable with this.  Agreeable with trial of zoloft 25mg  daily for depression.  Will also place referral for LCSW, counseling.  Will con't to follow with Dr. Maurice Small for AVM imaging/surveillance.  All questions were answered. The patient knows to call the clinic with any problems, questions or concerns. No barriers to learning were detected.  We ask that Namir Allee return to clinic in 6 months for evaluation, or sooner as needed.  The total time spent in the encounter was 30 minutes  and more than 50% was on counseling and review of test results   Henreitta Leber, MD Medical Director of Neuro[facility] at [facility] 06/07/23 9:01 AM

## 2023-08-27 IMAGING — MR MR HEAD WO/W CM
12 series · 48 of 48 positions shown · IV contrast (multihance)
Comparison: 05/28/2021

CLINICAL DATA: Follow-up treated AVM

EXAM:
MRI HEAD WITHOUT AND WITH CONTRAST
TECHNIQUE: Multiplanar, multiecho pulse sequences of the brain and surrounding
structures were obtained without and with intravenous contrast.
CONTRAST:  16mL MULTIHANCE GADOBENATE DIMEGLUMINE 529 MG/ML IV SOLN

[Series 2: FLAIR · sagittal · 3.0mm · 0.75mm/px · 3 of 41 slices shown (1 of 2)]
[im 1/41]
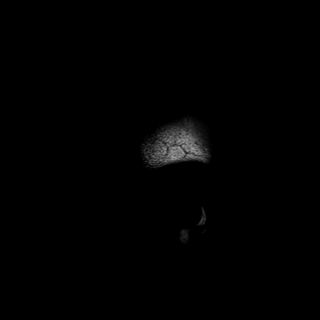
[im 21/41]
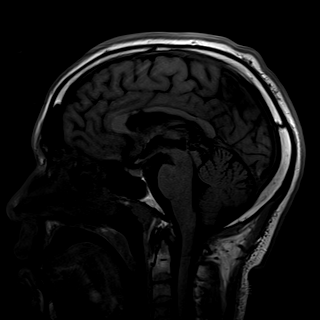
[im 41/41]
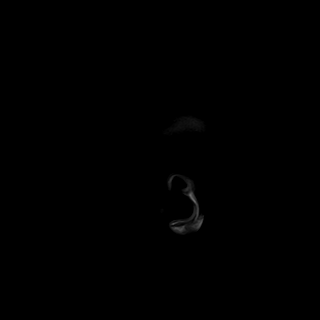

[Series 3: DWI · axial · 3.0mm · 1.50mm/px · z∈[-58,+88]mm · 4 of 78 slices shown (1 of 2)]
[im 1/78]
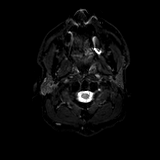
[im 26/78]
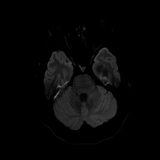
[im 52/78]
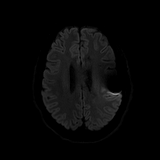
[im 78/78]
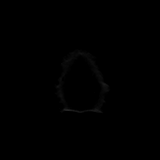

[Series 4: DWI · axial · 3.0mm · 1.50mm/px · z∈[-58,+88]mm · 2 of 39 slices shown (2 of 2)]
[im 1/39]
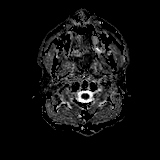
[im 39/39]
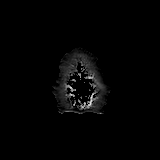

[Series 5: T2 · axial · 5.0mm · 0.57mm/px · 1 of 29 slices shown (1 of 2)]
[im 1/29]
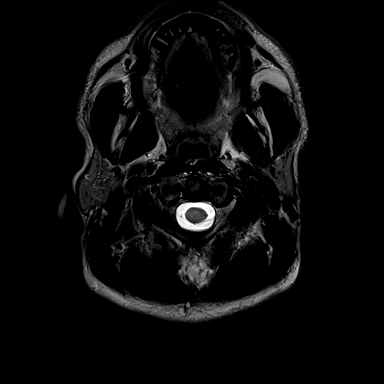

[Series 6: swi_images · axial · 1.5mm · 0.90mm/px · z∈[-69,+73]mm · 5 of 96 slices shown]
[im 1/96]
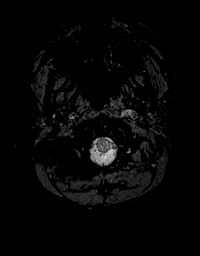
[im 24/96]
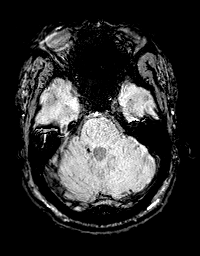
[im 48/96]
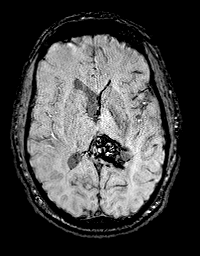
[im 72/96]
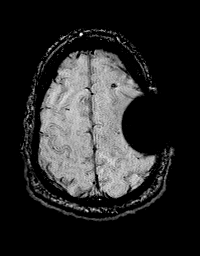
[im 96/96]
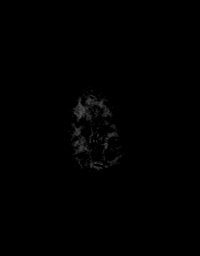

[Series 8: FLAIR · axial · 3.0mm · 0.86mm/px · z∈[-111,+81]mm · 3 of 65 slices shown (2 of 2)]
[im 1/65]
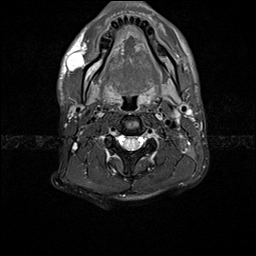
[im 33/65]
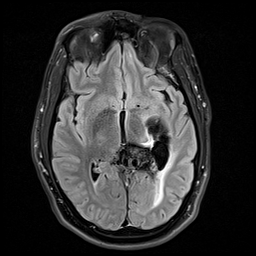
[im 65/65]
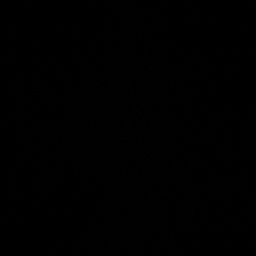

[Series 9: T2 · axial · non-contrast · 1.0mm · 0.86mm/px · z∈[-92,+79]mm · 8 of 176 slices shown (2 of 2)]
[im 1/176]
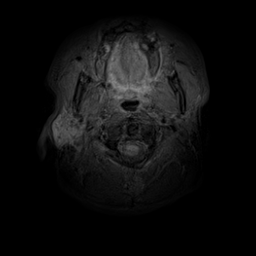
[im 26/176]
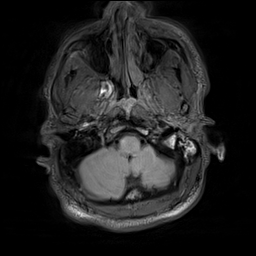
[im 51/176]
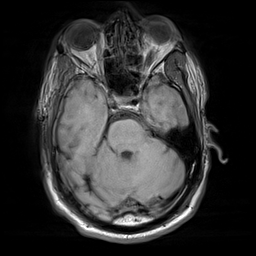
[im 76/176]
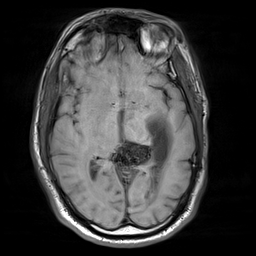
[im 101/176]
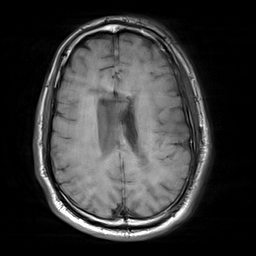
[im 126/176]
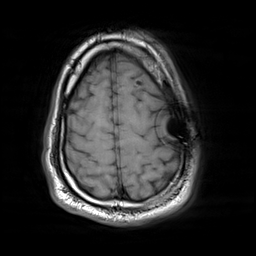
[im 151/176]
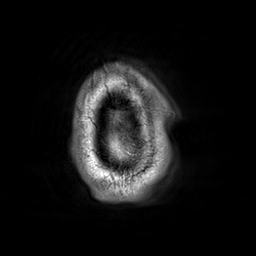
[im 176/176]
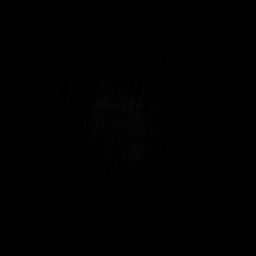

[Series 10: T2 post-contrast · coronal · 3.0mm · 0.57mm/px · 2 of 51 slices shown (1 of 2)]
[im 1/51]
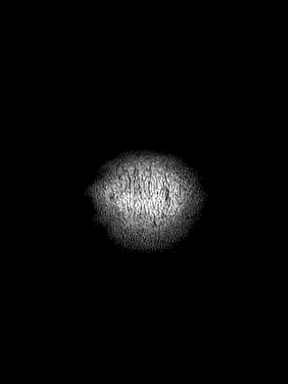
[im 51/51]
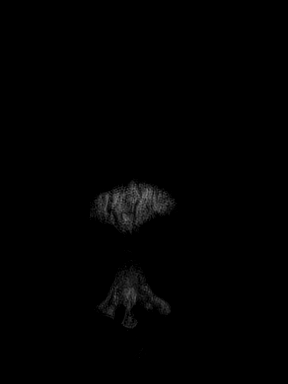

[Series 11: T2 post-contrast · axial · 1.0mm · 0.86mm/px · z∈[-92,+79]mm · 8 of 176 slices shown (2 of 2)]
[im 1/176]
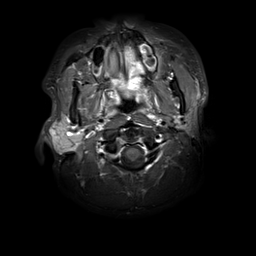
[im 26/176]
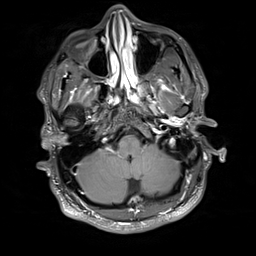
[im 51/176]
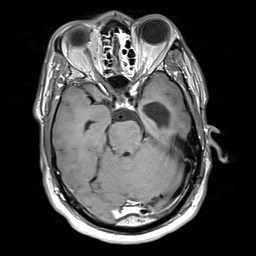
[im 76/176]
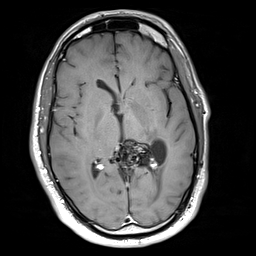
[im 101/176]
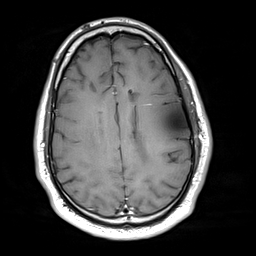
[im 126/176]
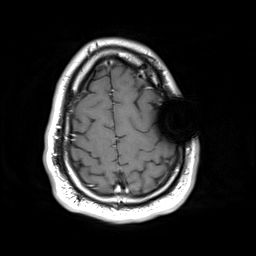
[im 151/176]
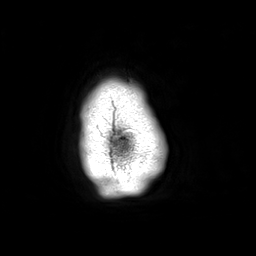
[im 176/176]
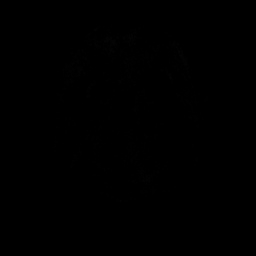

[Series 12: T1 post-contrast · axial · 1.0mm · 0.75mm/px · z∈[-86,+73]mm · 8 of 157 slices shown (1 of 2)]
[im 1/157]
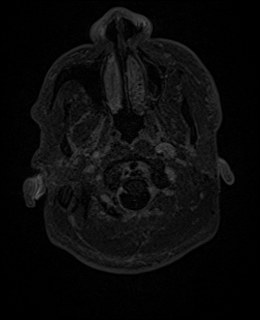
[im 23/157]
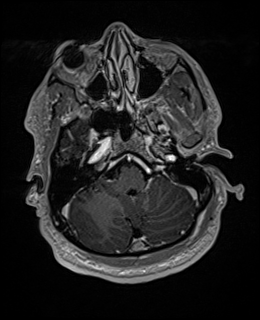
[im 45/157]
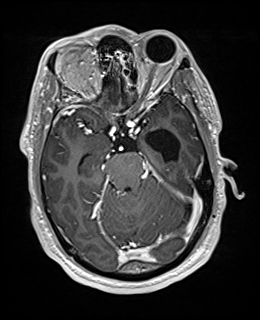
[im 67/157]
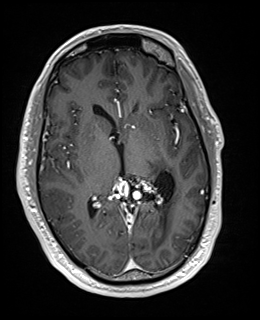
[im 90/157]
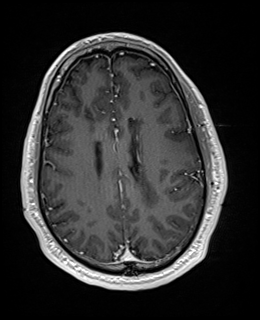
[im 112/157]
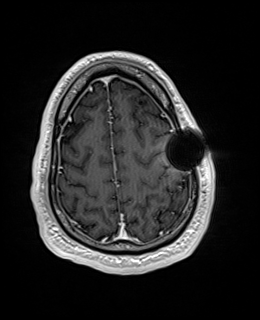
[im 134/157]
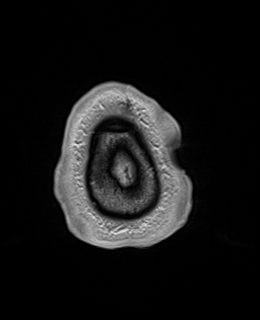
[im 157/157]
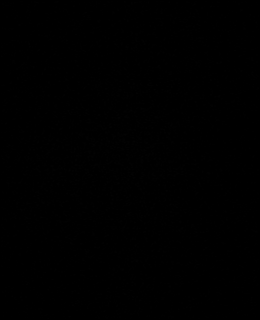

[Series 13: T1 post-contrast · coronal · 3.0mm · 0.57mm/px · 2 of 51 slices shown (2 of 2)]
[im 1/51]
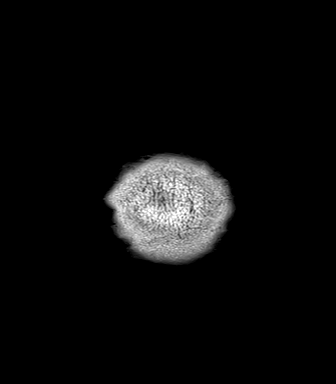
[im 51/51]
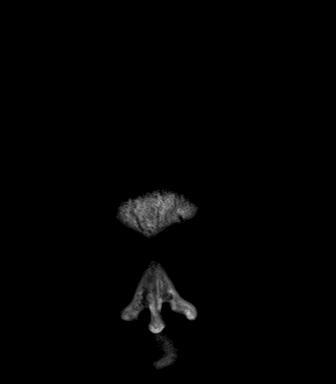

[Series 14: FLAIR post-contrast · sagittal · 3.0mm · 0.75mm/px · 2 of 41 slices shown]
[im 1/41]
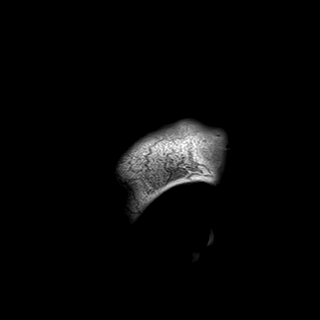
[im 41/41]
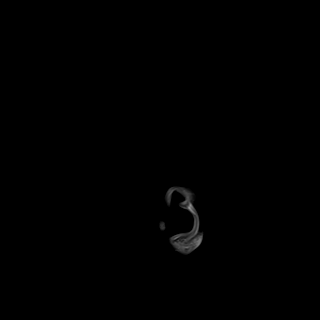

[48 of 48 positions shown; findings below may reference images not displayed]

FINDINGS: Brain: Posterior left thalamic AVM and sequelae of prior hemorrhage
again identified with chronic parenchymal and subarachnoid blood
products. Exact measurements of nidus are limited, but appears to be
a small decrease in size (1-2 mm in different dimensions). As
before, feeding is primarily from the dilated left PCA and posterior
communicating artery and dilated draining veins or present and
better characterized on the catheter angiogram. Some of the draining
veins appears slightly smaller.

There is similar configuration of the ventricular system with volume
loss about the left lateral ventricle and stable adjacent T2
hyperintensity. Dilatation of the left temporal horn has slightly
increased. As noted previously, there are possible
synechia/adhesions resulting in narrowing of the left lateral
ventricle with a component of the dilatation could be obstructive.

Left frontal approach shunt catheter remains present. Sequelae of
prior right frontal ventricular catheter tract.

No acute infarction.

Vascular: As above.

Skull and upper cervical spine: Normal marrow signal is preserved.

Sinuses/Orbits: Paranasal sinuses are aerated. Orbits are
unremarkable.

Other: Sella is unremarkable.  Mastoid air cells are clear.
IMPRESSION: Slight decrease in size of posterior left thalamic AVM. Some of the
draining veins appear slightly smaller.

Slight increase in size of the enlarged left temporal horn, which
could reflect ex vacuo dilatation or sequelae of scarring. Left
frontal shunt catheter remains present.

## 2023-10-04 ENCOUNTER — Other Ambulatory Visit: Payer: Self-pay | Admitting: Internal Medicine

## 2023-10-06 NOTE — Telephone Encounter (Signed)
 Continue Keppra  500 mg in last OV. Terrel Ferries, RN

## 2023-11-04 ENCOUNTER — Other Ambulatory Visit: Payer: Self-pay | Admitting: Internal Medicine

## 2024-03-08 ENCOUNTER — Encounter (HOSPITAL_COMMUNITY): Payer: Self-pay | Admitting: Emergency Medicine

## 2024-03-08 ENCOUNTER — Emergency Department (HOSPITAL_COMMUNITY)

## 2024-03-08 ENCOUNTER — Emergency Department (HOSPITAL_COMMUNITY)
Admission: EM | Admit: 2024-03-08 | Discharge: 2024-03-09 | Disposition: A | Discharge status: Home / Self Care | Attending: Emergency Medicine | Admitting: Emergency Medicine

## 2024-03-08 DIAGNOSIS — R42 Dizziness and giddiness: Secondary | ICD-10-CM | POA: Diagnosis present

## 2024-03-08 LAB — BASIC METABOLIC PANEL WITH GFR
Anion gap: 11 (ref 5–15)
BUN: 14 mg/dL (ref 6–20)
CO2: 23 mmol/L (ref 22–32)
Calcium: 9.6 mg/dL (ref 8.9–10.3)
Chloride: 106 mmol/L (ref 98–111)
Creatinine, Ser: 0.94 mg/dL (ref 0.61–1.24)
GFR, Estimated: >60 mL/min (ref >60)
Glucose, Bld: 84 mg/dL (ref 70–99)
Potassium: 4.2 mmol/L (ref 3.5–5.1)
Sodium: 140 mmol/L (ref 135–145)

## 2024-03-08 MED ORDER — IOHEXOL 350 MG/ML SOLN
75.0000 mL | Freq: Once | INTRAVENOUS | Status: AC | PRN
  Administered 2024-03-08: 75 mL via INTRAVENOUS

## 2024-03-08 MED ORDER — ACETAMINOPHEN 500 MG PO TABS
1000.0000 mg | ORAL_TABLET | Freq: Once | ORAL | Status: AC
  Administered 2024-03-08: 1000 mg via ORAL
  Filled 2024-03-08: qty 2

## 2024-03-08 NOTE — ED Notes (Signed)
 Unsuccessful attempt for IV for CT agio x3 by 2 nurse. Labs collected.

## 2024-03-08 NOTE — ED Notes (Signed)
 IV team able to place IV for CT angio. CT updated.

## 2024-03-08 NOTE — ED Provider Triage Note (Signed)
 Emergency Medicine Provider Triage Evaluation Note  Casey Branch , a 43 y.o. male  was evaluated in triage.  Pt complains of headahce, hx of AVM.  Review of Systems  Positive: headache Negative: No weakness, numbness, vision loss or speech changes, no dizziness  Physical Exam  BP (!) 140/90   Pulse 73   Temp 98.2 F (36.8 C)   Resp 18   SpO2 99%  Gen:   Awake, no distress   Resp:  Normal effort  MSK:   Moves extremities without difficulty  Other:    Medical Decision Making  Medically screening exam initiated at 4:29 PM.  Appropriate orders placed.  Casey Branch was informed that the remainder of the evaluation will be completed by another provider, this initial triage assessment does not replace that evaluation, and the importance of remaining in the ED until their evaluation is complete.     Casey Rue, DO 03/08/24 [phone]

## 2024-03-08 NOTE — ED Triage Notes (Signed)
 Pt here from home with c/o headache , hx of a stroke and shunt in 2022, no other complaints at this time

## 2024-03-09 LAB — I-STAT CHEM 8, ED
BUN: 18 mg/dL (ref 6–20)
Calcium, Ion: 1.19 mmol/L (ref 1.15–1.40)
Chloride: 104 mmol/L (ref 98–111)
Creatinine, Ser: 1 mg/dL (ref 0.61–1.24)
Glucose, Bld: 78 mg/dL (ref 70–99)
HCT: 46 % (ref 39.0–52.0)
Hemoglobin: 15.6 g/dL (ref 13.0–17.0)
Potassium: 3.7 mmol/L (ref 3.5–5.1)
Sodium: 141 mmol/L (ref 135–145)
TCO2: 30 mmol/L (ref 22–32)

## 2024-03-09 NOTE — ED Provider Notes (Signed)
 [place] EMERGENCY DEPARTMENT AT [facility] Provider Note  CSN: [id number] Arrival date & time: 03/08/24 1530  Chief Complaint(s) Headache  HPI Casey Branch is a 43 y.o. 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  States that he does drink Gatorade and water intermittently throughout the day.  Denies any nausea or vomiting.  No diarrhea.  No visual changes, focal deficits.  Endorsed mild headache.  Although this has subsided since waiting in the emergency department.  The history is provided by the patient and a relative.    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 Authorizing Provider  levETIRAcetam  (KEPPRA ) 500 MG tablet TAKE 1 TABLET BY MOUTH TWICE A DAY 10/06/23   Vaslow, Zachary K, MD  sertraline  (ZOLOFT ) 25 MG tablet TAKE 1 TABLET (25 MG TOTAL) BY MOUTH DAILY. 11/04/23   Vaslow, Zachary K, MD                                                                                                                                    Allergies Patient has no known allergies.  Review of Systems Review of Systems As noted in HPI  Physical Exam Vital Signs  I have reviewed the triage vital  signs BP 132/84 (BP Location: Left Arm)   Pulse 60   Temp 98.4 F (36.9 C) (Oral)   Resp 18   SpO2 100%   Physical Exam Vitals reviewed.  Constitutional:      General: He is not in acute distress.    Appearance: He is well-developed. He is not diaphoretic.  HENT:     Head: Normocephalic and atraumatic.     Right Ear: External ear normal.     Left Ear: External ear normal.     Nose: Nose normal.     Mouth/Throat:     Mouth: Mucous membranes are moist.   Eyes:     General: No scleral icterus.    Conjunctiva/sclera: Conjunctivae normal.   Neck:     Trachea: Phonation normal.   Cardiovascular:     Rate and Rhythm: Normal rate and regular rhythm.  Pulmonary:     Effort: Pulmonary effort is normal. No respiratory distress.  Breath sounds: No stridor.  Abdominal:     General: There is no distension.   Musculoskeletal:        General: Normal range of motion.     Cervical back: Normal range of motion.   Neurological:     Mental Status: He is alert and oriented to person, place, and time.     Comments:  Cranial Nerves:  II Visual Fields: Intact to confrontation. Visual fields intact. III, IV, VI: Pupils equal and reactive to light and near. Full eye movement without nystagmus  V Facial Sensation: Normal. No weakness of masticatory muscles  VII: No facial weakness or asymmetry  VIII Auditory Acuity: Grossly normal  IX/X: The uvula is midline; the palate elevates symmetrically  XI: Normal sternocleidomastoid and trapezius strength  XII: The tongue is midline. No atrophy or fasciculations.   Motor System: Muscle Strength: 5/5 and symmetric in the upper and lower extremities. No pronation or drift.  Muscle Tone: Tone and muscle bulk are normal in the upper and lower extremities.  Coordination: No tremor.  Sensation: Intact to light touch.  Gait: Routine gait normal.   Psychiatric:        Behavior: Behavior normal.     ED Results and Treatments Labs (all labs  ordered are listed, but only abnormal results are displayed) Labs Reviewed  BASIC METABOLIC PANEL WITH GFR  CBC WITH DIFFERENTIAL/PLATELET  I-STAT CHEM 8, ED                                                                                                                         EKG  EKG Interpretation Date/Time:    Ventricular Rate:    PR Interval:    QRS Duration:    QT Interval:    QTC Calculation:   R Axis:      Text Interpretation:         Radiology CT ANGIO HEAD NECK W WO CM Result Date: 03/09/2024 CLINICAL DATA:  Neuro deficit, acute, stroke suspected EXAM: CT ANGIOGRAPHY HEAD AND NECK WITH AND WITHOUT CONTRAST TECHNIQUE: Multidetector CT imaging of the head and neck was performed using the standard protocol during bolus administration of intravenous contrast. Multiplanar CT image reconstructions and MIPs were obtained to evaluate the vascular anatomy. Carotid stenosis measurements (when applicable) are obtained utilizing NASCET criteria, using the distal internal carotid diameter as the denominator. RADIATION DOSE REDUCTION: This exam was performed according to the departmental dose-optimization program which includes automated exposure control, adjustment of the mA and/or kV according to patient size and/or use of iterative reconstruction technique. CONTRAST:  75mL OMNIPAQUE  IOHEXOL  350 MG/ML SOLN COMPARISON:  CTA head/neck 05/24/2023. FINDINGS: CTA NECK FINDINGS Aortic arch: Great vessel origins are patent out significant stenosis. Right carotid system: No evidence of dissection, stenosis (50% or greater), or occlusion. Left carotid system: No evidence of dissection, stenosis (50% or greater), or occlusion. Vertebral arteries: Codominant. No evidence of dissection, stenosis (50% or greater), or occlusion. Skeleton: No acute abnormality on limited  assessment. Other neck: No acute abnormality on limited assessment. Upper chest: Visualized lung apices are clear. Review of the MIP images  confirms the above findings CTA HEAD FINDINGS Anterior circulation: Bilateral intracranial ICAs, MCAs, and ACAs are patent without proximal hemodynamically significant stenosis. Posterior circulation: Bilateral intradural vertebral arteries and basilar artery are patent. 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 Redemonstrated left posterior thalamic arteriovenous malformation which appears decreased in conspicuity with only subtle tangle of vessels in this region Venous sinuses: As permitted by contrast timing, patent. Review of the MIP images confirms the above findings IMPRESSION: 1. No emergent large vessel occlusion. 2. 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 3. Subtle left posterior thalamic arteriovenous malformation which appears decreased in conspicuity. Electronically Signed   By: Stevenson Elbe M.D.   On: 03/09/2024 00:13   CT Head Wo Contrast Result Date: 03/08/2024 CLINICAL DATA:  Headache, increasing frequency or severity. Patient has history of left thalamic arteriovenous malformation. EXAM: CT HEAD WITHOUT CONTRAST TECHNIQUE: Contiguous axial images were obtained from the base of the skull through the vertex without intravenous contrast. RADIATION DOSE REDUCTION: This exam was performed according to the departmental dose-optimization program which includes automated exposure control, adjustment of the mA and/or kV according to patient size and/or use of iterative reconstruction technique. COMPARISON:  MRI of the brain dated May 25, 2023 and CT of the head dated May 24, 2023. FINDINGS: Brain: Chronic encephalomalacia changes within the left thalamus with volume loss and ex vacuo dilatation of the temporal horn of the left lateral ventricle. A left frontal approach ventriculostomy catheter remains with its tip in the anterior horn of the lateral ventricle. There is moderate diffuse left cerebral white matter disease. The patient is also status post right  frontal burr craniotomy. A calculus or radiopaque opacity is again demonstrated within the right frontal subcortical white matter. No evidence of hemorrhage, mass or acute infarction. Vascular: Negative. Skull: Status post placement of left frontal approach ventriculoperitoneal shunt and right frontal burr craniotomy. The calvaria is otherwise intact. No lesions are present. Sinuses/Orbits: Unremarkable. Other: None. IMPRESSION: 1. Chronic encephalomalacia changes within the left thalamus and medial temporal lobe with ex vacuo dilatation of the left lateral ventricle and moderate cerebral white matter disease. No apparent acute process. Electronically Signed   By: Maribeth Shivers M.D.   On: 03/08/2024 16:46    Medications Ordered in ED Medications  acetaminophen  (TYLENOL ) tablet 1,000 mg (1,000 mg Oral Given 03/08/24 2026)  iohexol  (OMNIPAQUE ) 350 MG/ML injection 75 mL (75 mLs Intravenous Contrast Given 03/08/24 2324)   Procedures Procedures  (including critical care time) Medical Decision Making / ED Course   Medical Decision Making Amount and/or Complexity of Data Reviewed Labs: ordered. Decision-making details documented in ED Course. Radiology: ordered and independent interpretation performed. Decision-making details documented in ED Course.    Patient is here for mild headache with orthostatic lightheadedness.   Exam without acute neurochanges.  CT head ordered and MSE process negative for ICH or acute vascular changes.  BMP without electrolyte abnormalities or renal insufficiency.  Patient CBC sample was not sufficient for lab to run it.  I ordered an i-STAT Chem-8 which showed no evidence of anemia.      Final Clinical Impression(s) / ED Diagnoses Final diagnoses:  Orthostatic lightheadedness   The patient appears reasonably screened and/or stabilized for discharge and I doubt any other medical condition or other [facility] requiring further screening, evaluation, or treatment in the ED  at  this time. I have discussed the findings, Dx and Tx plan with the patient/family who expressed understanding and agree(s) with the plan. Discharge instructions discussed at length. The patient/family was given strict return precautions who verbalized understanding of the instructions. No further questions at time of discharge.  Disposition: Discharge  Condition: Good  ED Discharge Orders     None        Follow Up: Senaida Dama, NP [address] [place] [address] [phone]  Call  to schedule an appointment for close follow up    This chart was dictated using voice recognition software.  Despite best efforts to proofread,  errors can occur which can change the documentation meaning.    Lindle Rhea, MD 03/09/24 [phone]

## 2024-05-11 ENCOUNTER — Telehealth: Payer: Self-pay | Admitting: Internal Medicine

## 2024-05-11 NOTE — Telephone Encounter (Signed)
 Scheduled appointment per staff message. Talked with the patient and he is aware of the made appointment.

## 2024-05-28 ENCOUNTER — Encounter (HOSPITAL_COMMUNITY): Payer: Self-pay

## 2024-05-28 ENCOUNTER — Other Ambulatory Visit: Payer: Self-pay | Admitting: Internal Medicine

## 2024-06-01 ENCOUNTER — Ambulatory Visit (HOSPITAL_COMMUNITY)
Admission: RE | Admit: 2024-06-01 | Discharge: 2024-06-01 | Disposition: A | Discharge status: Home / Self Care | Payer: Self-pay | Source: Ambulatory Visit | Attending: Internal Medicine | Admitting: Internal Medicine

## 2024-06-01 DIAGNOSIS — Q282 Arteriovenous malformation of cerebral vessels: Secondary | ICD-10-CM | POA: Insufficient documentation

## 2024-06-01 MED ORDER — GADOBUTROL 1 MMOL/ML IV SOLN
7.0000 mL | Freq: Once | INTRAVENOUS | Status: AC | PRN
  Administered 2024-06-01: 7 mL via INTRAVENOUS

## 2024-06-06 ENCOUNTER — Inpatient Hospital Stay: Payer: Self-pay | Admitting: Internal Medicine

## 2024-06-06 ENCOUNTER — Telehealth: Payer: Self-pay | Admitting: *Deleted

## 2024-06-06 NOTE — Telephone Encounter (Signed)
 PC to patient regarding missed appointment today, he says he wasn't aware.  Informed patient our scheduling department will contact him to reschedule.  He verbalizes understanding.  Scheduling message sent.

## 2024-06-07 ENCOUNTER — Telehealth: Payer: Self-pay | Admitting: Internal Medicine

## 2024-06-07 NOTE — Telephone Encounter (Signed)
 Casey Branch has re-scheduled his appointment for next Monday 9/22. He wrote it down and confirmed multiple times the correct date and time.

## 2024-06-12 ENCOUNTER — Inpatient Hospital Stay: Payer: Self-pay | Admitting: Internal Medicine

## 2024-06-12 ENCOUNTER — Telehealth: Payer: Self-pay | Admitting: *Deleted

## 2024-06-12 NOTE — Telephone Encounter (Signed)
 PC to patient regarding missed appointment today, rescheduled appointment on 06/20/24 at 9:30.  He verbalizes understanding.

## 2024-06-20 ENCOUNTER — Telehealth: Payer: Self-pay | Admitting: Internal Medicine

## 2024-06-20 ENCOUNTER — Inpatient Hospital Stay: Payer: Self-pay | Attending: Internal Medicine | Admitting: Internal Medicine

## 2024-06-20 VITALS — BP 131/87 | HR 53 | Temp 97.3°F | Resp 20 | Wt 179.3 lb

## 2024-06-20 DIAGNOSIS — Z79899 Other long term (current) drug therapy: Secondary | ICD-10-CM | POA: Insufficient documentation

## 2024-06-20 DIAGNOSIS — R569 Unspecified convulsions: Secondary | ICD-10-CM | POA: Insufficient documentation

## 2024-06-20 DIAGNOSIS — Q282 Arteriovenous malformation of cerebral vessels: Secondary | ICD-10-CM | POA: Insufficient documentation

## 2024-06-20 DIAGNOSIS — Z87891 Personal history of nicotine dependence: Secondary | ICD-10-CM | POA: Insufficient documentation

## 2024-06-20 MED ORDER — DONEPEZIL HCL 5 MG PO TABS: 5.0000 mg | ORAL_TABLET | Freq: Every day | ORAL | 2 refills | Status: AC

## 2024-06-20 NOTE — Progress Notes (Signed)
 [facility] at [facility] [address]  [place], [place] [address] [phone]   Interval Evaluation  Date of Service: 06/20/24 Patient Name: Casey Branch Patient MRN: [id number] Patient DOB: Sep 14, 1981 Provider: Arthea MARLA Manns, MD  Identifying Statement:  Casey Branch is a 43 y.o. 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  EVD placed until 6/23 (Ostergard) 06/05/21: Radiosurgery for left temporal AVM Cinda) 06/06/21: Presents with focal seizure, Keppra  initiated.  No structural changes  Interval History: Casey Branch presents today for follow up after recent MRI brain.  Still having occasional periods of dizziness. No breakthrough seizures, he maintains compliance with Keppra  500mg  BID.  Overall no seizure in past 3 years.  Still describes short term memory issues, cognitive slowing at times since radiation.  H+P (12/16/21) Patient presents today at request of radiation oncology for follow up given neurologic issues surrounding AVM.  Keppra  had been discontinued (by neurosurgery?) after wean over past 2 months.  No frank recurrence of seizures, but he does describe paroxysmal episodes of dizziness which have no positional correlate.  Frequency is intermittent, less than daily.  Over past few weeks, he has also described headaches occurring less than daily.  They affect the left side of his head and are not severe, no nausea/vomiting, no time of day predilection.  He is otherwise functioning well, working full time.  Pl[place] wife and son.  Medications: Current Outpatient Medications on File Prior to Visit  Medication Sig Dispense Refill   levETIRAcetam  (KEPPRA ) 500 MG tablet TAKE 1 TABLET BY MOUTH TWICE A DAY 60 tablet 5   sertraline  (ZOLOFT ) 25 MG tablet TAKE 1 TABLET (25 MG TOTAL) BY MOUTH DAILY. 30 tablet 2   No  current facility-administered medications on file prior to visit.    Allergies: No Known Allergies Past Medical History:  Past Medical History:  Diagnosis Date   Brain aneurysm    Past Surgical History:  Past Surgical History:  Procedure Laterality Date   IR 3D INDEPENDENT WKST  03/11/2021   IR ANGIO INTRA EXTRACRAN SEL COM CAROTID INNOMINATE BILAT MOD SED  03/11/2021   IR ANGIO VERTEBRAL SEL VERTEBRAL BILAT MOD SED  03/11/2021   RADIOLOGY WITH ANESTHESIA N/A 03/11/2021   Procedure: IR WITH ANESTHESIA - DIAGNOSTIC CEREBRAL ANGIOGRAM;  Surgeon: Lanis Pupa, MD;  Location: [facility] OR;  Service: Radiology;  Laterality: N/A;   VENTRICULOPERITONEAL SHUNT     Social History:  Social History   Socioeconomic History   Marital status: Married    Spouse name: Not on file   Number of children: Not on file   Years of education: Not on file   Highest education level: Not on file  Occupational History   Not on file  Tobacco Use   Smoking status: Former    Types: Cigarettes    Passive exposure: Never   Smokeless tobacco: Never  Vaping Use   Vaping status: Never Used  Substance and Sexual Activity   Alcohol  use: Not Currently    Comment: occasioanl   Drug use: No   Sexual activity: Yes  Other Topics Concern   Not on file  Social History Narrative   Not on file   Social Drivers of Health   Financial Resource Strain: Not on file  Food Insecurity: Not on file  Transportation Needs: Not on file  Physical Activity: Not on file  Stress: Not on file  Social Connections: Unknown (02/03/2022)   Received from [facility]   Social Network    Social Network: Not on file  Intimate Partner Violence: Unknown (12/26/2021)   Received from [facility]   HITS    Physically Hurt: Not on file    Insult or Talk Down To: Not on file    Threaten Physical Harm: Not on file    Scream or Curse: Not on file   Family History: No family history on file.  Review of Systems: Constitutional:  Doesn't report fevers, chills or abnormal weight loss Eyes: Doesn't report blurriness of vision Ears, nose, mouth, throat, and face: Doesn't report sore throat Respiratory: Doesn't report cough, dyspnea or wheezes Cardiovascular: Doesn't report palpitation, chest discomfort  Gastrointestinal:  Doesn't report nausea, constipation, diarrhea GU: Doesn't report incontinence Skin: Doesn't report skin rashes Neurological: Per HPI Musculoskeletal: Doesn't report joint pain Behavioral/Psych: Doesn't report anxiety  Physical Exam: Vitals:   06/20/24 0945  BP: 131/87  Pulse: (!) 53  Resp: 20  Temp: (!) 97.3 F (36.3 C)  SpO2: 99%   KPS: 90. General: Alert, cooperative, pleasant, in no acute distress Head: Normal EENT: No conjunctival injection or scleral icterus.  Lungs: Resp effort normal Cardiac: Regular rate Abdomen: Non-distended abdomen Skin: No rashes cyanosis or petechiae. Extremities: No clubbing or edema  Neurologic Exam: Mental Status: Awake, alert, attentive to examiner. Oriented to self and environment. Language is fluent with intact comprehension.  Cranial Nerves: Visual acuity is grossly normal. Visual fields are full. Extra-ocular movements intact. No ptosis. Face is symmetric Motor: Tone and bulk are normal. Power is full in both arms and legs. Reflexes are symmetric, no pathologic reflexes present.  Sensory: Intact to light touch Gait: Normal.   Labs: I have reviewed the data as listed    Component Value Date/Time   NA 141 03/09/2024 0340   NA 141 12/09/2021 1115   K 3.7 03/09/2024 0340   CL 104 03/09/2024 0340   CO2 23 03/08/2024 2018   GLUCOSE 78 03/09/2024 0340   BUN 18 03/09/2024 0340   BUN 13 12/09/2021 1115   CREATININE 1.00 03/09/2024 0340   CREATININE 1.20 05/29/2021 1045   CALCIUM  9.6 03/08/2024 2018   PROT 7.0 05/24/2023 2000   PROT 7.7 12/09/2021 1115   ALBUMIN 3.7 05/24/2023 2000   ALBUMIN 4.6 12/09/2021 1115   AST 20 05/24/2023 2000    ALT 18 05/24/2023 2000   ALKPHOS 46 05/24/2023 2000   BILITOT 0.6 05/24/2023 2000   BILITOT 0.4 12/09/2021 1115   GFRNONAA >60 03/08/2024 2018   GFRNONAA >60 05/29/2021 1045   Lab Results  Component Value Date   WBC 5.4 05/24/2023   NEUTROABS 3.8 05/24/2023   HGB 15.6 03/09/2024   HCT 46.0 03/09/2024   MCV 89.3 05/24/2023   PLT 222 05/24/2023    Imaging:  MR BRAIN W WO CONTRAST Result Date: 06/01/2024 EXAM: MRI BRAIN WITH AND WITHOUT CONTRAST 06/01/2024 04:33:08 PM TECHNIQUE: Multiplanar multisequence MRI of the head/brain was performed with and without the administration of intravenous contrast. COMPARISON: CT angio head and neck 03/08/2024. MR head without contrast 11/22/2022. CLINICAL HISTORY: Brain/CNS neoplasm, assess treatment response. FINDINGS: BRAIN AND VENTRICLES: Remote hemorrhage is again noted within the posterior left thalamus and left lateral ventricle. Chronic volume loss is present in the medial left temporal lobe and hippocampus. Left frontal ventriculostomy catheter terminates in the left ventricle. Chronic periventricular white matter changes are stable bilaterally. Minimal residual left thalamic AVM (  arteriovenous malformation) is again noted. No pathologic enhancement is present. ORBITS: No acute abnormality. SINUSES: No acute abnormality. BONES AND SOFT TISSUES: Normal bone marrow signal and enhancement. No acute soft tissue abnormality. IMPRESSION: 1. Minimal residual left thalamic AVM, stable compared to prior studies. No pathologic enhancement. 2. Remote hemorrhage in the posterior left thalamus and left lateral ventricle. 3. Chronic volume loss in the medial left temporal lobe and hippocampus. 4. Left frontal ventriculostomy catheter terminates in the left ventricle. 5. Stable chronic periventricular white matter changes bilaterally. Electronically signed by: Lonni Necessary MD 06/01/2024 05:09 PM EDT RP Workstation: [id number]    Assessment/Plan Focal seizures  (HCC)  Verdel Moose is clinically stable today, with static ongoing memory issues, some dizziness.  MRI is unchanged from prior.  Due to prolonged seizure freedom >3 years, stable imaging and exam, some side effects (cognitive, dizziness) from Keppra , we are agreeable with trial of cessation of Keppra .  He understands risk of breakthrough seizure will be increased.  Patient had good results previously from another provider taking Donepezil  for cognitive deficits.  He would like to try this again, we agreeable with trial of 5mg  dose level, to take at night.  Will con't to follow with [facility] for AVM imaging/surveillance.  All questions were answered. The patient knows to call the clinic with any problems, questions or concerns. No barriers to learning were detected.  We ask that Banjamin Stovall return to clinic in 6 months for evaluation, or sooner as needed.  The total time spent in the encounter was 30 minutes and more than 50% was on counseling and review of test results   Arthea MARLA Manns, MD Medical Director of Neuro-Oncology [facility] 06/20/24 9:42 AM

## 2024-06-20 NOTE — Telephone Encounter (Signed)
 Scheduled patient for phone visit in 6 months. Called and left voicemail with appointment details.

## 2024-12-18 ENCOUNTER — Telehealth: Payer: Self-pay | Admitting: Internal Medicine
